# Patient Record
Sex: Female | Born: 1935 | Race: White | Hispanic: No | Marital: Married | Smoking: Never smoker
Health system: Southern US, Community
[De-identification: ages and names within clinical notes are randomized; demographics above are authoritative.]

## PROBLEM LIST (undated history)

## (undated) DIAGNOSIS — K5900 Constipation, unspecified: Secondary | ICD-10-CM

## (undated) DIAGNOSIS — N189 Chronic kidney disease, unspecified: Secondary | ICD-10-CM

## (undated) DIAGNOSIS — F329 Major depressive disorder, single episode, unspecified: Secondary | ICD-10-CM

## (undated) DIAGNOSIS — R269 Unspecified abnormalities of gait and mobility: Secondary | ICD-10-CM

## (undated) DIAGNOSIS — G20 Parkinson's disease: Secondary | ICD-10-CM

## (undated) DIAGNOSIS — S72143A Displaced intertrochanteric fracture of unspecified femur, initial encounter for closed fracture: Secondary | ICD-10-CM

## (undated) DIAGNOSIS — N390 Urinary tract infection, site not specified: Secondary | ICD-10-CM

## (undated) DIAGNOSIS — F32A Depression, unspecified: Secondary | ICD-10-CM

## (undated) DIAGNOSIS — E559 Vitamin D deficiency, unspecified: Secondary | ICD-10-CM

## (undated) DIAGNOSIS — I10 Essential (primary) hypertension: Secondary | ICD-10-CM

## (undated) DIAGNOSIS — F419 Anxiety disorder, unspecified: Secondary | ICD-10-CM

## (undated) DIAGNOSIS — I4891 Unspecified atrial fibrillation: Secondary | ICD-10-CM

## (undated) DIAGNOSIS — D649 Anemia, unspecified: Secondary | ICD-10-CM

## (undated) DIAGNOSIS — R609 Edema, unspecified: Secondary | ICD-10-CM

## (undated) DIAGNOSIS — G8929 Other chronic pain: Secondary | ICD-10-CM

## (undated) DIAGNOSIS — G4700 Insomnia, unspecified: Secondary | ICD-10-CM

## (undated) DIAGNOSIS — E876 Hypokalemia: Secondary | ICD-10-CM

## (undated) DIAGNOSIS — G20A1 Parkinson's disease without dyskinesia, without mention of fluctuations: Secondary | ICD-10-CM

## (undated) HISTORY — DX: Edema, unspecified: R60.9

## (undated) HISTORY — DX: Essential (primary) hypertension: I10

## (undated) HISTORY — DX: Hypokalemia: E87.6

## (undated) HISTORY — DX: Anxiety disorder, unspecified: F41.9

## (undated) HISTORY — PX: OTHER SURGICAL HISTORY: SHX169

## (undated) HISTORY — DX: Constipation, unspecified: K59.00

## (undated) HISTORY — DX: Other chronic pain: G89.29

## (undated) HISTORY — DX: Urinary tract infection, site not specified: N39.0

## (undated) HISTORY — DX: Major depressive disorder, single episode, unspecified: F32.9

## (undated) HISTORY — DX: Displaced intertrochanteric fracture of unspecified femur, initial encounter for closed fracture: S72.143A

## (undated) HISTORY — DX: Unspecified abnormalities of gait and mobility: R26.9

## (undated) HISTORY — DX: Unspecified atrial fibrillation: I48.91

## (undated) HISTORY — DX: Parkinson's disease without dyskinesia, without mention of fluctuations: G20.A1

## (undated) HISTORY — DX: Depression, unspecified: F32.A

## (undated) HISTORY — DX: Anemia, unspecified: D64.9

## (undated) HISTORY — DX: Parkinson's disease: G20

## (undated) HISTORY — DX: Vitamin D deficiency, unspecified: E55.9

## (undated) HISTORY — DX: Chronic kidney disease, unspecified: N18.9

## (undated) HISTORY — DX: Insomnia, unspecified: G47.00

---

## 1998-06-25 ENCOUNTER — Ambulatory Visit (HOSPITAL_COMMUNITY): Admission: RE | Admit: 1998-06-25 | Discharge: 1998-06-25 | Payer: Self-pay | Admitting: Specialist

## 1999-04-21 ENCOUNTER — Ambulatory Visit (HOSPITAL_COMMUNITY): Admission: RE | Admit: 1999-04-21 | Discharge: 1999-04-21 | Payer: Self-pay | Admitting: Family Medicine

## 1999-04-21 ENCOUNTER — Encounter: Payer: Self-pay | Admitting: Family Medicine

## 1999-05-20 ENCOUNTER — Other Ambulatory Visit: Admission: RE | Admit: 1999-05-20 | Discharge: 1999-05-20 | Payer: Self-pay | Admitting: Family Medicine

## 2000-07-06 ENCOUNTER — Other Ambulatory Visit: Admission: RE | Admit: 2000-07-06 | Discharge: 2000-07-06 | Payer: Self-pay | Admitting: Family Medicine

## 2000-08-22 ENCOUNTER — Ambulatory Visit (HOSPITAL_COMMUNITY): Admission: RE | Admit: 2000-08-22 | Discharge: 2000-08-22 | Payer: Self-pay | Admitting: Family Medicine

## 2000-08-22 ENCOUNTER — Encounter: Payer: Self-pay | Admitting: Family Medicine

## 2000-11-21 ENCOUNTER — Encounter: Admission: RE | Admit: 2000-11-21 | Discharge: 2000-11-21 | Payer: Self-pay | Admitting: Family Medicine

## 2000-11-21 ENCOUNTER — Encounter: Payer: Self-pay | Admitting: Family Medicine

## 2000-11-22 ENCOUNTER — Ambulatory Visit (HOSPITAL_COMMUNITY): Admission: RE | Admit: 2000-11-22 | Discharge: 2000-11-22 | Payer: Self-pay | Admitting: Gastroenterology

## 2002-01-18 ENCOUNTER — Other Ambulatory Visit: Admission: RE | Admit: 2002-01-18 | Discharge: 2002-01-18 | Payer: Self-pay | Admitting: Family Medicine

## 2002-11-05 ENCOUNTER — Encounter: Payer: Self-pay | Admitting: Family Medicine

## 2002-11-05 ENCOUNTER — Encounter: Admission: RE | Admit: 2002-11-05 | Discharge: 2002-11-05 | Payer: Self-pay | Admitting: Family Medicine

## 2002-11-16 ENCOUNTER — Encounter: Payer: Self-pay | Admitting: Emergency Medicine

## 2002-11-16 ENCOUNTER — Emergency Department (HOSPITAL_COMMUNITY): Admission: EM | Admit: 2002-11-16 | Discharge: 2002-11-16 | Payer: Self-pay | Admitting: Emergency Medicine

## 2002-11-19 ENCOUNTER — Emergency Department (HOSPITAL_COMMUNITY): Admission: EM | Admit: 2002-11-19 | Discharge: 2002-11-19 | Payer: Self-pay | Admitting: *Deleted

## 2003-05-01 ENCOUNTER — Other Ambulatory Visit: Admission: RE | Admit: 2003-05-01 | Discharge: 2003-05-01 | Payer: Self-pay | Admitting: Family Medicine

## 2003-07-24 ENCOUNTER — Encounter: Payer: Self-pay | Admitting: Family Medicine

## 2003-07-24 ENCOUNTER — Encounter: Admission: RE | Admit: 2003-07-24 | Discharge: 2003-07-24 | Payer: Self-pay | Admitting: Family Medicine

## 2004-03-09 ENCOUNTER — Ambulatory Visit (HOSPITAL_COMMUNITY): Admission: RE | Admit: 2004-03-09 | Discharge: 2004-03-09 | Payer: Self-pay | Admitting: Specialist

## 2004-07-24 ENCOUNTER — Ambulatory Visit (HOSPITAL_COMMUNITY): Admission: RE | Admit: 2004-07-24 | Discharge: 2004-07-24 | Payer: Self-pay | Admitting: Gastroenterology

## 2005-01-04 ENCOUNTER — Encounter: Admission: RE | Admit: 2005-01-04 | Discharge: 2005-04-04 | Payer: Self-pay | Admitting: Internal Medicine

## 2005-04-20 ENCOUNTER — Encounter: Admission: RE | Admit: 2005-04-20 | Discharge: 2005-07-19 | Payer: Self-pay | Admitting: Internal Medicine

## 2005-06-25 ENCOUNTER — Ambulatory Visit: Payer: Self-pay | Admitting: Internal Medicine

## 2005-07-27 ENCOUNTER — Ambulatory Visit: Payer: Self-pay | Admitting: Cardiovascular Disease

## 2005-08-03 ENCOUNTER — Encounter: Admission: RE | Admit: 2005-08-03 | Discharge: 2005-11-01 | Payer: Self-pay | Admitting: Internal Medicine

## 2005-08-09 ENCOUNTER — Ambulatory Visit: Payer: Self-pay

## 2005-09-30 ENCOUNTER — Ambulatory Visit: Payer: Self-pay | Admitting: Cardiovascular Disease

## 2005-11-02 ENCOUNTER — Encounter: Admission: RE | Admit: 2005-11-02 | Discharge: 2005-12-26 | Payer: Self-pay | Admitting: Internal Medicine

## 2006-01-05 ENCOUNTER — Emergency Department (HOSPITAL_COMMUNITY): Admission: EM | Admit: 2006-01-05 | Discharge: 2006-01-05 | Payer: Self-pay | Admitting: Emergency Medicine

## 2006-01-12 ENCOUNTER — Ambulatory Visit: Payer: Self-pay | Admitting: Cardiovascular Disease

## 2006-01-26 ENCOUNTER — Ambulatory Visit: Payer: Self-pay | Admitting: Cardiology

## 2006-02-09 ENCOUNTER — Ambulatory Visit: Payer: Self-pay | Admitting: Internal Medicine

## 2006-02-18 ENCOUNTER — Ambulatory Visit: Payer: Self-pay | Admitting: Psychiatry

## 2006-02-18 ENCOUNTER — Other Ambulatory Visit (HOSPITAL_COMMUNITY): Admission: RE | Admit: 2006-02-18 | Discharge: 2006-02-23 | Payer: Self-pay | Admitting: Psychiatry

## 2006-02-23 ENCOUNTER — Ambulatory Visit: Payer: Self-pay | Admitting: Internal Medicine

## 2006-03-16 ENCOUNTER — Ambulatory Visit: Payer: Self-pay | Admitting: Cardiology

## 2006-03-30 ENCOUNTER — Ambulatory Visit: Payer: Self-pay | Admitting: Cardiology

## 2006-03-30 ENCOUNTER — Ambulatory Visit: Payer: Self-pay | Admitting: Cardiovascular Disease

## 2006-04-28 ENCOUNTER — Inpatient Hospital Stay (HOSPITAL_COMMUNITY): Admission: RE | Admit: 2006-04-28 | Discharge: 2006-05-11 | Payer: Self-pay | Admitting: Psychiatry

## 2006-04-29 ENCOUNTER — Ambulatory Visit: Payer: Self-pay | Admitting: Psychiatry

## 2006-05-09 ENCOUNTER — Ambulatory Visit (HOSPITAL_COMMUNITY): Admission: RE | Admit: 2006-05-09 | Discharge: 2006-05-09 | Payer: Self-pay | Admitting: Psychiatry

## 2006-07-11 ENCOUNTER — Ambulatory Visit: Payer: Self-pay | Admitting: Internal Medicine

## 2006-08-02 ENCOUNTER — Ambulatory Visit: Payer: Self-pay | Admitting: Internal Medicine

## 2006-09-08 ENCOUNTER — Ambulatory Visit: Payer: Self-pay | Admitting: Internal Medicine

## 2006-09-13 ENCOUNTER — Ambulatory Visit: Payer: Self-pay | Admitting: Cardiology

## 2006-09-27 ENCOUNTER — Ambulatory Visit: Payer: Self-pay | Admitting: *Deleted

## 2006-10-11 ENCOUNTER — Ambulatory Visit: Payer: Self-pay | Admitting: Internal Medicine

## 2006-10-12 LAB — CONVERTED CEMR LAB
Bilirubin Urine: NEGATIVE
Epithelial cells, urine: NEGATIVE /lpf
Hemoglobin, Urine: NEGATIVE
Mucus, UA: NEGATIVE
Nitrite: POSITIVE — AB
Total Protein, Urine: NEGATIVE mg/dL
pH: 6.5 (ref 5.0–8.0)

## 2006-11-08 ENCOUNTER — Ambulatory Visit: Payer: Self-pay | Admitting: Internal Medicine

## 2007-02-06 ENCOUNTER — Ambulatory Visit: Payer: Self-pay | Admitting: Internal Medicine

## 2007-10-24 ENCOUNTER — Emergency Department (HOSPITAL_COMMUNITY): Admission: EM | Admit: 2007-10-24 | Discharge: 2007-10-24 | Payer: Self-pay | Admitting: Emergency Medicine

## 2008-12-09 ENCOUNTER — Emergency Department (HOSPITAL_COMMUNITY): Admission: EM | Admit: 2008-12-09 | Discharge: 2008-12-09 | Payer: Self-pay | Admitting: Emergency Medicine

## 2008-12-10 ENCOUNTER — Inpatient Hospital Stay: Payer: Self-pay | Admitting: Unknown Physician Specialty

## 2009-01-13 ENCOUNTER — Inpatient Hospital Stay (HOSPITAL_COMMUNITY): Admission: EM | Admit: 2009-01-13 | Discharge: 2009-01-17 | Payer: Self-pay | Admitting: Emergency Medicine

## 2009-01-13 ENCOUNTER — Ambulatory Visit: Payer: Self-pay | Admitting: Internal Medicine

## 2009-01-17 ENCOUNTER — Inpatient Hospital Stay (HOSPITAL_COMMUNITY): Admission: RE | Admit: 2009-01-17 | Discharge: 2009-01-22 | Payer: Self-pay | Admitting: Psychiatry

## 2009-01-18 ENCOUNTER — Ambulatory Visit: Payer: Self-pay | Admitting: Psychiatry

## 2010-10-24 ENCOUNTER — Inpatient Hospital Stay (HOSPITAL_COMMUNITY): Admission: EM | Admit: 2010-10-24 | Discharge: 2010-11-04 | Payer: Self-pay | Admitting: Emergency Medicine

## 2010-10-24 ENCOUNTER — Ambulatory Visit: Payer: Self-pay | Admitting: Cardiology

## 2010-10-26 ENCOUNTER — Encounter (INDEPENDENT_AMBULATORY_CARE_PROVIDER_SITE_OTHER): Payer: Self-pay | Admitting: Internal Medicine

## 2010-10-26 DIAGNOSIS — F333 Major depressive disorder, recurrent, severe with psychotic symptoms: Secondary | ICD-10-CM

## 2010-11-04 DIAGNOSIS — F331 Major depressive disorder, recurrent, moderate: Secondary | ICD-10-CM

## 2011-01-17 ENCOUNTER — Encounter: Payer: Self-pay | Admitting: Internal Medicine

## 2011-03-03 ENCOUNTER — Encounter (HOSPITAL_COMMUNITY): Payer: Self-pay

## 2011-03-03 ENCOUNTER — Emergency Department (HOSPITAL_COMMUNITY)
Admission: EM | Admit: 2011-03-03 | Discharge: 2011-03-03 | Disposition: A | Discharge status: Home / Self Care | Payer: Medicare Other | Attending: Emergency Medicine | Admitting: Emergency Medicine

## 2011-03-03 ENCOUNTER — Emergency Department (HOSPITAL_COMMUNITY): Payer: Medicare Other

## 2011-03-03 DIAGNOSIS — Y9210 Unspecified residential institution as the place of occurrence of the external cause: Secondary | ICD-10-CM | POA: Insufficient documentation

## 2011-03-03 DIAGNOSIS — IMO0002 Reserved for concepts with insufficient information to code with codable children: Secondary | ICD-10-CM | POA: Insufficient documentation

## 2011-03-03 DIAGNOSIS — W19XXXA Unspecified fall, initial encounter: Secondary | ICD-10-CM | POA: Insufficient documentation

## 2011-03-03 DIAGNOSIS — F341 Dysthymic disorder: Secondary | ICD-10-CM | POA: Insufficient documentation

## 2011-03-03 DIAGNOSIS — S0003XA Contusion of scalp, initial encounter: Secondary | ICD-10-CM | POA: Insufficient documentation

## 2011-03-03 DIAGNOSIS — N390 Urinary tract infection, site not specified: Secondary | ICD-10-CM | POA: Insufficient documentation

## 2011-03-03 LAB — URINE MICROSCOPIC-ADD ON

## 2011-03-03 LAB — POCT I-STAT, CHEM 8
BUN: 19 mg/dL (ref 6–23)
Chloride: 103 mEq/L (ref 96–112)
Glucose, Bld: 109 mg/dL — ABNORMAL HIGH (ref 70–99)
HCT: 37 % (ref 36.0–46.0)
Potassium: 3.6 mEq/L (ref 3.5–5.1)

## 2011-03-03 LAB — URINALYSIS, ROUTINE W REFLEX MICROSCOPIC
Bilirubin Urine: NEGATIVE
Glucose, UA: NEGATIVE mg/dL
Nitrite: POSITIVE — AB
Specific Gravity, Urine: 1.017 (ref 1.005–1.030)
pH: 6.5 (ref 5.0–8.0)

## 2011-03-05 LAB — URINE CULTURE

## 2011-03-09 LAB — PROTIME-INR
INR: 1.22 (ref 0.00–1.49)
INR: 1.3 (ref 0.00–1.49)
INR: 1.84 — ABNORMAL HIGH (ref 0.00–1.49)
INR: 2.51 — ABNORMAL HIGH (ref 0.00–1.49)
INR: 2.58 — ABNORMAL HIGH (ref 0.00–1.49)
INR: 2.8 — ABNORMAL HIGH (ref 0.00–1.49)
Prothrombin Time: 15.6 seconds — ABNORMAL HIGH (ref 11.6–15.2)
Prothrombin Time: 16.4 seconds — ABNORMAL HIGH (ref 11.6–15.2)
Prothrombin Time: 21.4 seconds — ABNORMAL HIGH (ref 11.6–15.2)
Prothrombin Time: 23.6 seconds — ABNORMAL HIGH (ref 11.6–15.2)
Prothrombin Time: 27.2 seconds — ABNORMAL HIGH (ref 11.6–15.2)
Prothrombin Time: 29.6 seconds — ABNORMAL HIGH (ref 11.6–15.2)

## 2011-03-09 LAB — CBC
HCT: 30.1 % — ABNORMAL LOW (ref 36.0–46.0)
HCT: 30.9 % — ABNORMAL LOW (ref 36.0–46.0)
Hemoglobin: 10.3 g/dL — ABNORMAL LOW (ref 12.0–15.0)
Hemoglobin: 11.6 g/dL — ABNORMAL LOW (ref 12.0–15.0)
Hemoglobin: 12.1 g/dL (ref 12.0–15.0)
Hemoglobin: 9.8 g/dL — ABNORMAL LOW (ref 12.0–15.0)
MCH: 31.4 pg (ref 26.0–34.0)
MCHC: 34 g/dL (ref 30.0–36.0)
MCHC: 34.3 g/dL (ref 30.0–36.0)
MCHC: 34.4 g/dL (ref 30.0–36.0)
MCHC: 34.5 g/dL (ref 30.0–36.0)
MCV: 91.7 fL (ref 78.0–100.0)
RBC: 3.29 MIL/uL — ABNORMAL LOW (ref 3.87–5.11)
RBC: 3.63 MIL/uL — ABNORMAL LOW (ref 3.87–5.11)
RBC: 3.87 MIL/uL (ref 3.87–5.11)
RDW: 13.4 % (ref 11.5–15.5)
WBC: 10 10*3/uL (ref 4.0–10.5)
WBC: 6.8 10*3/uL (ref 4.0–10.5)
WBC: 9.2 10*3/uL (ref 4.0–10.5)

## 2011-03-09 LAB — BASIC METABOLIC PANEL
BUN: 13 mg/dL (ref 6–23)
BUN: 5 mg/dL — ABNORMAL LOW (ref 6–23)
CO2: 28 mEq/L (ref 19–32)
Calcium: 8.9 mg/dL (ref 8.4–10.5)
Chloride: 107 mEq/L (ref 96–112)
Chloride: 109 mEq/L (ref 96–112)
Creatinine, Ser: 0.55 mg/dL (ref 0.4–1.2)
GFR calc Af Amer: >60 mL/min (ref >60)
GFR calc non Af Amer: >60 mL/min (ref >60)
GFR calc non Af Amer: >60 mL/min (ref >60)
Glucose, Bld: 105 mg/dL — ABNORMAL HIGH (ref 70–99)
Glucose, Bld: 118 mg/dL — ABNORMAL HIGH (ref 70–99)
Potassium: 3.4 mEq/L — ABNORMAL LOW (ref 3.5–5.1)
Potassium: 4.3 mEq/L (ref 3.5–5.1)
Sodium: 138 mEq/L (ref 135–145)

## 2011-03-10 LAB — DIFFERENTIAL
Basophils Absolute: 0 10*3/uL (ref 0.0–0.1)
Basophils Relative: 0 % (ref 0–1)
Basophils Relative: 1 % (ref 0–1)
Eosinophils Absolute: 0 10*3/uL (ref 0.0–0.7)
Eosinophils Relative: 0 % (ref 0–5)
Monocytes Absolute: 0.6 10*3/uL (ref 0.1–1.0)
Monocytes Absolute: 0.9 10*3/uL (ref 0.1–1.0)
Monocytes Relative: 9 % (ref 3–12)
Neutro Abs: 4.5 10*3/uL (ref 1.7–7.7)

## 2011-03-10 LAB — CBC
HCT: 38.2 % (ref 36.0–46.0)
HCT: 40.7 % (ref 36.0–46.0)
Hemoglobin: 12.2 g/dL (ref 12.0–15.0)
MCH: 31.8 pg (ref 26.0–34.0)
MCHC: 33.8 g/dL (ref 30.0–36.0)
MCHC: 34.1 g/dL (ref 30.0–36.0)
MCV: 92.2 fL (ref 78.0–100.0)
MCV: 93.7 fL (ref 78.0–100.0)
Platelets: 233 10*3/uL (ref 150–400)
Platelets: 247 10*3/uL (ref 150–400)
RBC: 3.9 MIL/uL (ref 3.87–5.11)
RBC: 4.15 MIL/uL (ref 3.87–5.11)
RDW: 13.2 % (ref 11.5–15.5)
RDW: 13.3 % (ref 11.5–15.5)
RDW: 13.3 % (ref 11.5–15.5)
WBC: 7.1 10*3/uL (ref 4.0–10.5)
WBC: 9.8 10*3/uL (ref 4.0–10.5)

## 2011-03-10 LAB — BASIC METABOLIC PANEL
BUN: 10 mg/dL (ref 6–23)
BUN: 10 mg/dL (ref 6–23)
BUN: 9 mg/dL (ref 6–23)
CO2: 24 mEq/L (ref 19–32)
CO2: 25 mEq/L (ref 19–32)
Chloride: 103 mEq/L (ref 96–112)
Chloride: 106 mEq/L (ref 96–112)
Creatinine, Ser: 0.61 mg/dL (ref 0.4–1.2)
Creatinine, Ser: 0.67 mg/dL (ref 0.4–1.2)
GFR calc non Af Amer: >60 mL/min (ref >60)
Potassium: 4.6 mEq/L (ref 3.5–5.1)

## 2011-03-10 LAB — RAPID URINE DRUG SCREEN, HOSP PERFORMED
Opiates: NOT DETECTED
Tetrahydrocannabinol: NOT DETECTED

## 2011-03-10 LAB — URINE MICROSCOPIC-ADD ON

## 2011-03-10 LAB — PROTIME-INR
INR: 1.34 (ref 0.00–1.49)
INR: 1.44 (ref 0.00–1.49)
Prothrombin Time: 12.6 seconds (ref 11.6–15.2)
Prothrombin Time: 16.8 seconds — ABNORMAL HIGH (ref 11.6–15.2)
Prothrombin Time: 17.7 seconds — ABNORMAL HIGH (ref 11.6–15.2)

## 2011-03-10 LAB — APTT: aPTT: 31 seconds (ref 24–37)

## 2011-03-10 LAB — TSH: TSH: 1.708 u[IU]/mL (ref 0.350–4.500)

## 2011-03-10 LAB — CK TOTAL AND CKMB (NOT AT ARMC)
CK, MB: 4.7 ng/mL — ABNORMAL HIGH (ref 0.3–4.0)
Total CK: 285 U/L — ABNORMAL HIGH (ref 7–177)

## 2011-03-10 LAB — COMPREHENSIVE METABOLIC PANEL
AST: 16 U/L (ref 0–37)
Albumin: 3.6 g/dL (ref 3.5–5.2)
Alkaline Phosphatase: 80 U/L (ref 39–117)
BUN: 15 mg/dL (ref 6–23)
GFR calc Af Amer: >60 mL/min (ref >60)
Potassium: 3.3 mEq/L — ABNORMAL LOW (ref 3.5–5.1)
Sodium: 142 mEq/L (ref 135–145)
Total Protein: 6.7 g/dL (ref 6.0–8.3)

## 2011-03-10 LAB — URINE CULTURE

## 2011-03-10 LAB — ETHANOL: Alcohol, Ethyl (B): <5 mg/dL (ref 0–10)

## 2011-03-10 LAB — URINALYSIS, ROUTINE W REFLEX MICROSCOPIC
Glucose, UA: NEGATIVE mg/dL
Nitrite: POSITIVE — AB
Specific Gravity, Urine: 1.019 (ref 1.005–1.030)
pH: 7.5 (ref 5.0–8.0)

## 2011-04-12 LAB — PROTIME-INR
INR: 1.8 — ABNORMAL HIGH (ref 0.00–1.49)
INR: 2.5 — ABNORMAL HIGH (ref 0.00–1.49)
INR: 1.4 (ref 0.00–1.49)
INR: 1.8 — ABNORMAL HIGH (ref 0.00–1.49)
INR: 2.5 — ABNORMAL HIGH (ref 0.00–1.49)
INR: 8.4 (ref 0.00–1.49)
Prothrombin Time: 19.3 seconds — ABNORMAL HIGH (ref 11.6–15.2)
Prothrombin Time: 18.2 seconds — ABNORMAL HIGH (ref 11.6–15.2)
Prothrombin Time: 21.5 seconds — ABNORMAL HIGH (ref 11.6–15.2)
Prothrombin Time: 28.6 seconds — ABNORMAL HIGH (ref 11.6–15.2)
Prothrombin Time: 78.7 seconds — ABNORMAL HIGH (ref 11.6–15.2)

## 2011-04-12 LAB — CBC
HCT: 41.7 % (ref 36.0–46.0)
Hemoglobin: 13.9 g/dL (ref 12.0–15.0)
Platelets: 264 10*3/uL (ref 150–400)
RBC: 4.4 MIL/uL (ref 3.87–5.11)
RDW: 13.6 % (ref 11.5–15.5)
WBC: 10.3 10*3/uL (ref 4.0–10.5)

## 2011-04-12 LAB — APTT: aPTT: 48 seconds — ABNORMAL HIGH (ref 24–37)

## 2011-04-12 LAB — DIFFERENTIAL
Basophils Absolute: 0 10*3/uL (ref 0.0–0.1)
Basophils Relative: 0 % (ref 0–1)
Eosinophils Absolute: 0 10*3/uL (ref 0.0–0.7)
Neutro Abs: 8.9 10*3/uL — ABNORMAL HIGH (ref 1.7–7.7)
Neutrophils Relative %: 86 % — ABNORMAL HIGH (ref 43–77)

## 2011-04-12 LAB — BASIC METABOLIC PANEL
Chloride: 112 mEq/L (ref 96–112)
GFR calc non Af Amer: >60 mL/min (ref >60)
GFR calc non Af Amer: >60 mL/min (ref >60)
Glucose, Bld: 112 mg/dL — ABNORMAL HIGH (ref 70–99)
Glucose, Bld: 77 mg/dL (ref 70–99)
Potassium: 4.1 mEq/L (ref 3.5–5.1)
Potassium: 4.4 mEq/L (ref 3.5–5.1)
Sodium: 138 mEq/L (ref 135–145)
Sodium: 140 mEq/L (ref 135–145)

## 2011-04-12 LAB — CK
Total CK: 303 U/L — ABNORMAL HIGH (ref 7–177)
Total CK: 93 U/L (ref 7–177)
Total CK: 989 U/L — ABNORMAL HIGH (ref 7–177)

## 2011-04-12 LAB — URINALYSIS, ROUTINE W REFLEX MICROSCOPIC
Bilirubin Urine: NEGATIVE
Glucose, UA: NEGATIVE mg/dL
Glucose, UA: NEGATIVE mg/dL
Ketones, ur: >80 mg/dL — AB
Ketones, ur: NEGATIVE mg/dL
Leukocytes, UA: NEGATIVE
Protein, ur: 30 mg/dL — AB
Protein, ur: NEGATIVE mg/dL
Urobilinogen, UA: 0.2 mg/dL (ref 0.0–1.0)

## 2011-04-12 LAB — RAPID URINE DRUG SCREEN, HOSP PERFORMED
Amphetamines: NOT DETECTED
Benzodiazepines: POSITIVE — AB
Cocaine: NOT DETECTED

## 2011-04-12 LAB — ETHANOL: Alcohol, Ethyl (B): <5 mg/dL (ref 0–10)

## 2011-05-06 ENCOUNTER — Other Ambulatory Visit: Payer: Self-pay | Admitting: Family Medicine

## 2011-05-06 DIAGNOSIS — M25571 Pain in right ankle and joints of right foot: Secondary | ICD-10-CM

## 2011-05-07 ENCOUNTER — Other Ambulatory Visit: Payer: Medicare Other

## 2011-05-11 NOTE — H&P (Signed)
NAME:  [name] [name], [name] [name]                  ACCOUNT NO.:  [id number]   MEDICAL RECORD NO.:  [id number]          PATIENT TYPE:  IPS   LOCATION:  0300                          FACILITY:  [facility]   PHYSICIAN:  Anselm Jungling, MD  DATE OF BIRTH:  05-14-1936   DATE OF ADMISSION:  01/17/2009  DATE OF DISCHARGE:                       PSYCHIATRIC ADMISSION ASSESSMENT   This is a voluntary admission to the services of Dr. Geralyn Flash.   This is a 75 year old married female.  She has been married to this  husband for 34 years.  Today, she can tell me the reason she is here is  because I took a bunch of pills.  Apparently, she was suspicious that  her husband was having an affair.  There has been much marital arguing  about this.  The husband acknowledges that he has been cussing at her;  however, they both deny physical abuse.   While driving the patient to the hospital after she overdosed, she tried  to jump out of the car.  She had displayed a 6-week progressive  depressed mood, decreased energy, poor concentration, anhedonia, and  suicidal thoughts.   She has an extensive psychiatric history.  She claims her first bout of  depression was in 1975.  She has had numerous psychiatric admissions to  [facility], to [facility], to [place].  She has had ECT in the past.  She stated  that the last time they did ECT was in 2008; however, it did not work.  Last year when admitted to [facility], they suggested ECT, but she denied  it.   She states that although she has sisters who are diagnosed as bipolar,  she herself is not bipolar.   SOCIAL HISTORY:  She is divorced once.  She has 2 children from that  marriage.  She graduated high school.  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   FAMILY HISTORY:  Patient is 1 of 5 siblings, 4 of whom have required  psychiatric hospitalization.   ALCOHOL/DRUG HISTORY:  She denies.   PRIMARY CARE [place]:  Dr. Artist Pais and  Dr. Carney Corners.  She had her first visit  with Dr. Betti Cruz on December 30, 2008.  One of her sisters goes to Dr.  Betti Cruz.   MEDICAL PROBLEMS:  A fib.   MEDICATIONS:  At the time she was discharged from inpatient care over to  [place], she was discharged on:  1. Atenolol 25 mg p.o. b.i.d.  2. Remeron 30 mg nightly.  3. Effexor XR 75 mg p.o. daily.  4. Multivitamin.  5. Diltiazem CD 180 mg p.o. daily.  6. Ativan 1 mg p.o. q.4h. p.r.n. acute agitation.  7. Acetaminophen 650 mg p.o. q.6h. p.r.n.  8. Lunesta 2 mg nightly for sleep.   DRUG ALLERGIES:  PENICILLIN.   POSITIVE PHYSICAL FINDINGS:  She is thin.  She reports having a 50-pound  weight loss over the last 2 years.  The rest of her physical exam is  well documented and on the chart.  VITAL SIGNS ON ADMISSION:  She  is 60-1/2 inches tall.  She weighs 103.  Temperature 98.  Blood pressure 123/75.  Pulse 86.  Respirations 16.   She is status post a tubal ligation, glaucoma, history for A fib.  She  reports having had elevated blood sugar while on Seroquel, which  resolved once off.  She also currently has bilateral hand tremor, which  is a new onset and probably is familial.   MENTAL STATUS EXAM:  She is alert and oriented.  She is appropriately  groomed, dressed, and nourished.  She has very good eye contact.  Her  speech is not pressured.  Her mood is appropriate to the situation.  She  reports more severe depression than her affect reflects  Judgment and  insight are good.  Concentration and memory are intact.  Intelligence is  at least average.  She denies being actively suicidal or homicidal at  this time.  She denies auditory or visual hallucinations.   DIAGNOSES:   AXIS I:  1. Mood disorder, not otherwise specified.  2. Depressed.  3. Major depressive disorder, recurrent, severe.   AXIS II:  None.   AXIS III:  Long history for atrial fibrillation and glaucoma.   AXIS IV:  1. Marital.  2. General medical issues.   AXIS V:   20.   PLAN:  Admit for further safety and stabilization.  Pharmacy will follow  her Coumadin for her A fib.  We are going to stop the Effexor and start  Prozac.  We can begin a dose with that today, 20 mg p.o. q.a.m. and will  continue her other meds.   ESTIMATED LENGTH OF STAY:  3-5 days.     Mickie Leonarda Salon, P.A.-C.      Anselm Jungling, MD  Electronically Signed   MD/MEDQ  D:  01/18/2009  T:  01/18/2009  Job:  [id number]

## 2011-05-11 NOTE — Discharge Summary (Signed)
NAME:  [facility], Reise                  ACCOUNT NO.:  [id number]   MEDICAL RECORD NO.:  [id number]          PATIENT TYPE:  IPS   LOCATION:  0300                          FACILITY:  [facility]   PHYSICIAN:  Jasmine Pang, M.D. DATE OF BIRTH:  10/15/1936   DATE OF ADMISSION:  01/17/2009  DATE OF DISCHARGE:  01/22/2009                               DISCHARGE SUMMARY   IDENTIFICATION:  This is a 75 year old married white female who was  admitted on a voluntary basis on January 17, 2009.   HISTORY OF PRESENT ILLNESS:  The patient has been married to her husband  for 34 years.  She states she is here because I took a bunch of pills.  Apparently, she was suspicious that her husband was having an affair.  There was marital arguing about this.  Husband acknowledges that he has  been cussing at her.  However, they both deny physical abuse.  While  driving the patient to the hospital, she overdosed and tried to jump out  of the car.  She has displayed a 6-week progressive depressed mood,  decreased energy, poor concentration, anhedonia, and suicidal thoughts.  She has an extensive psychiatric history.  She claims her first bout of  depression was in 1975.  She has had numerous psychiatric admissions to  [facility] [place].  She has had ECT in the past.  She stated the  last time they did ECT was in 2008; however, it did not work.  Last year  when she admitted to [facility], they suggested ECT, but she declined it.  She states that although she has sisters, who are diagnosed as bipolar,  she herself with not bipolar.  She had her first visit with Dr. Betti Cruz on  December 30, 2008.  One of her sisters goes to Dr. Betti Cruz and that is how  she got referred to him.   PAST PSYCHIATRIC HISTORY:  See above.   FAMILY HISTORY:  The patient's one of 5 siblings, 4 of them have  required psychiatric hospitalizations.   ALCOHOL AND DRUG HISTORY:  The patient denies.   MEDICAL PROBLEMS:  Atrial fibrillation and  glaucoma.   MEDICATIONS:  1. Atenolol 25 mg p.o. b.i.d.  2. Remeron 30 mg nightly.  3. Effexor XR 75 mg daily.  4. Multivitamin daily.  5. Diltiazem CD 180 mg daily.  6. Ativan 1 mg p.o. q.4 h. p.r.n. agitation.  7. Acetaminophen 650 mg p.o. q.6 h. p.r.n. pain.  8. Lunesta 2 mg nightly for sleep.   DRUG ALLERGIES:  PENICILLIN.   PHYSICAL FINDINGS:  The patient's physical exam was done in the ED prior  to transfer here.  She reports having a 50-pound weight loss over the  past 2 years.  There were no other notable medical or physical problems.  She is status post tubal ligation, glaucoma, and has a history for AFib.  She reports having elevated blood sugar while on Seroquel, which  resolved.  She also currently has bilateral hand tremor, which is a new  onset and probably is from milieu.   HOSPITAL COURSE:  Upon admission, the patient was started on Coumadin 5  mg p.o. daily with the pharmacy to monitor Coumadin dosing.  She was  also started on atenolol 25 mg p.o. b.i.d., Remeron 30 mg p.o. q.h.s.,  Effexor XR 75 mg p.o. q. day, multivitamin p.o. q. day, diltiazem CD 118  mg daily, Ativan 1 mg p.o. q.6 hours p.r.n. agitation.  She was also  placed on one-to-one due to her unsteadiness and concern that she was a  fall risk.  On January 18, 2009, the Effexor was stopped.  She was  started on Prozac 20 mg daily since she states this has helped her in  the past.  She was also placed on Lunesta 2 mg p.o. q.h.s. and she  states Alfonso Patten has helped her in the past.  In individual sessions with  me, the patient was reserved, but cooperative.  She states she had been  in the [facility].  Dr. Okey Dupre had consulted on her and felt  she needed to transfer here.  She was admitted to [facility] because of  an overdose of pills (Pristiq, Remeron, and atenolol).  She stated she  did not want to live.  She is suspicious that her husband is having an  affair.  There has been marital  arguing about this.  The patient has had  psychomotor retardation.  Speech was soft and slow.  She was disheveled.  Mood was depressed, anxious.  Affect consistent with mood.  Anxiety  level moderate.  There was no evidence of psychosis or thought disorder.  As hospitalization progressed, she continued to be depressed and  anxious.  She did, however, participate in unit therapeutic groups and  activities.  Sleep was poor.  She was very frustrated about this.  She  continued to be on one-to-one for being a fall risk.  Lunesta was  increased to 3 mg p.o. q.h.s. due to her difficulty sleeping.  On  January 20, 2009, she continued to be depressed and anxious, but there  was no suicidal ideation.  Her husband had been visiting.  She states  this has gone well, but he does not understand depression.  She is  worried about getting worse if she goes home.  On January 21, 2009, the  patient was less depressed, less anxious.  She was talking about the  possibility of going home soon.  She still said she feels hopeless,  however.  She was having difficulty sleeping again and Lunesta was  increased to 6 mg p.o. q.h.s.  One-to-one was continued due to fall  risk.  There was a family session with the patient and her husband.  She  reported that she had been depressed over a number of years, but thinks  it gotten worse, significantly worse over the past few months because  she suspected her husband was having an affair.  She reported that he  hired a woman to help with household chores, cooking, cleaning, Architectural technologist. and  this woman moved in and took over my house and my husband.  The  patient reports that husband bought the woman a car and was spending  inappropriate amounts of time with her.  Husband denied that anything  happened and tried to redirect the discussion back to the patient's  depression.  His believe is that she does not want to get better.  Both  were unable to see each others perspective and  wanted to place the blame  on one another.  They  both said they wanted to improve things, but need  or were willing to entertain discussion of anything that might work  (individual marital counseling support groups etc).  Counselor  encouraged husband to consider how the marital problems are contributing  to the depression and vice versa.  Counselor also encouraged the patient  to take advantage of the help that is being offered.  On January 22, 2009, the patient's mental status had improved.  She was less depressed,  less anxious.  Affect was consistent with mood.  There was no suicidal  ideation or homicidal ideation.  No thoughts of self-injurious behavior.  No auditory or visual hallucinations.  No paranoia or delusions.  Thoughts were logical and goal-directed.  Thought content no predominant  theme.  Cognitive was grossly intact.  Insight fair.  Judgment good.  Impulse control good.  The patient wanted to go home today and it was  felt she was safe for discharge.  Her husband agreed and came to pick  her up.   DISCHARGE DIAGNOSES:  Axis I:  Major depressive disorder, recurrent  severe without psychosis.  Axis II:  None.  Axis III:  History of atrial fibrillation and glaucoma.  Axis IV:  Severe (marital, general medical issues, burden of psychiatric  disorder).  Axis V:  Global assessment of functioning was 50 upon discharge.  GAF  was 20 upon admission.  GAF highest past year was 60 to 65.   DISCHARGE PLANS:  There was no specific dietary restrictions.  The  patient's activity level was limited by her unsteady gait.   POSTHOSPITAL CARE PLANS:  The patient will go to [facility] to see Saul Fordyce on February 1st at 1:40 p.m.  She will also  get a counselor at this appointment.   DISCHARGE MEDICATIONS:  1. Tenormin 25 mg one twice a day.  2. Remeron 30 mg at bedtime.  3. Prozac 20 mg daily.  4. Multivitamin daily.  5. Cardizem 180 mg daily.  6.  Coumadin 5 mg 1-1/2 pill on Thursday, January 23, 2009, then had PT      and INR checked on Friday for further dosing of Coumadin.  7. Restoril 15 mg p.o. q.h.s. 1 to 2 tablets as needed.      Jasmine Pang, M.D.  Electronically Signed     BHS/MEDQ  D:  01/22/2009  T:  01/22/2009  Job:  [id number]

## 2011-05-11 NOTE — Discharge Summary (Signed)
NAME:  [name] [name]                  ACCOUNT NO.:  [id number]   MEDICAL RECORD NO.:  [id number]          PATIENT TYPE:  INP   LOCATION:  1510                         FACILITY:  [facility]   PHYSICIAN:  Rosalyn Gess. Norins, MD  DATE OF BIRTH:  02/24/1936   DATE OF ADMISSION:  01/13/2009  DATE OF DISCHARGE:                               DISCHARGE SUMMARY   ADMITTING DIAGNOSES:  1. Suicidal ideation and attempt.  2. Dehydration.  3. Rhabdomyolysis.  4. Atrial fibrillation with rapid ventricular response.   DISCHARGE DIAGNOSES:  1. Mood disorder with depressive affect, recurrent and severe.  Ready      for inpatient care per recommendations of consulting psychiatrist.  2. Atrial fibrillation with rapid ventricular response, stable on      calcium-channel blocker.  3. Rhabdomyolysis, resolved.  4. Coagulopathy, resolved.   HISTORY OF PRESENT ILLNESS:  The patient is a 75 year old Caucasian  female with a known history of psychiatric disorder.  She was brought to  the emergency department for inpatient treatment after she made an  attempt to jump out of a moving vehicle today.  She also take an  overdose of her medications including Pristiq, Remeron and atenolol.  She was found at admission in the emergency department to have a very  rapid heart rate.  Also, she appeared to be dehydrated.  She was  subsequently admitted for care.   PAST MEDICAL HISTORY:  Per the admission note by Dr. Yetta [name].   FAMILY HISTORY/SOCIAL HISTORY:  Likewise well documented in the  admission note.   MEDICAL HISTORY:  Is significant for:  1. Glaucoma.  2. Tonsillectomy.  3. Tubal ligation.  4. Atrial fibrillation.   The patient's social history is significant for having been married and  divorced.  She has 2 biologic children.  She has been having marital  stress.   HOSPITAL COURSE:  1. Psychiatric.  The patient was put on suicide watch.  She was seen      in consultation by Dr. Antonietta Breach.  Please see  his dictated      consult note.  His recommendations were inpatient psychiatric      treatment when medically stable.  The patient during her hospital      course has been relatively calm and doing well with the medications      that she is currently taking.  2. Atrial fibrillation with rapid ventricular response.  The patient      was initially on telemetry.  She had a very rapid heart rate in the      120 to 130 range.  She was started on diltiazem 60 mg q.8 h. and on      this regimen maintained a stable heart rate with atrial      fibrillation.  Would at this time continue this medication in long-      acting form of diltiazem CD 180 mg once daily.  3. Rhabdomyolysis.  The patient had initial CK of 989 with no MB      fraction.  The patient was hydrated.  CKs were tracked,  and they      returned to normal with final CK being 93 with this problem being      resolved.  4. Coagulopathy.  The patient's INR dramatically increased after      hospital admission, reaching a maximum of 9.1.  It was thought this      was due to overdose of Pristiq which has a known interaction with      Coumadin.  Pharmacy helped manage the patient.  Coumadin was held,      so was Lovenox.  She was given 5 mg of vitamin K on the day prior      to discharge.  Final discharge INR was 1.8.  The patient will be      resumed on Coumadin therapy.   With the patient's medical problems being stable, she is now ready for  transfer to inpatient psychiatric care.   DISCHARGE EXAMINATION:  Temperature 97.8, blood pressure 118/67, heart  rate 78, respirations 18.  GENERAL APPEARANCE:  This is an woman sitting on the side of the bed  with a calm affect in no acute distress.  HEENT EXAM:  The patient has a small abrasion of the left forehead.  Otherwise unremarkable.  NECK:  Was supple.  CHEST:  No CVA tenderness.  LUNGS:  Clear with no rales, wheezes or rhonchi.  CARDIOVASCULAR:  Patient had a quiet precordium.  She  had an irregular  irregular heart rate that was rate controlled.  No significant murmur  noted.  ABDOMEN:  Was soft.  EXTREMITIES:  Without deformity.  NEUROLOGIC EXAM:  The patient is awake, alert.  She has a depressed  affect.  Cranial nerves II-XII grossly intact.  No further testing was  performed.   FINAL DATA:  The patient had CT of the brain at time of admission which  showed no significant or acute findings.   FINAL LABORATORY:  INR on the day of discharge was 1.8.  Urinalysis from  January 21 was clear.  Creatinine kinase total from January 21 was 93  and normal.  Basic metabolic panel from January 20 with a glucose of  112, creatinine of 0.49.  Electrolytes were normal.   DISCHARGE MEDICATIONS:  1. Will restart Coumadin 5 mg daily.  The patient will need to have a      follow-up pro time on Monday 25th or Tuesday the 26th.  2. Atenolol 25 mg b.i.d.  3. Remeron 30 mg every night.  4. Effexor XR 75 mg daily.  5. Multivitamin daily.  6. Diltiazem CD 180 mg daily.  7. Ativan 1 mg p.o. or IM q.4 h. p.r.n. acute agitation.  8. Acetaminophen 650 mg q.6 h. p.r.n.  9. Lunesta 2 mg every night for sleep.   The patient's condition at time of discharge dictation is medically  stable and ready for transfer to inpatient psychiatric care.      Rosalyn Gess Norins, MD  Electronically Signed     MEN/MEDQ  D:  01/17/2009  T:  01/17/2009  Job:  [id number]   cc:   Antonietta Breach, M.D.
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
## 2011-05-14 NOTE — Assessment & Plan Note (Signed)
[facility]                             PRIMARY CARE OFFICE NOTE   NAME:[name] [name]                         MRN:          [id number]  DATE:07/11/2006                            DOB:          1936-07-01    The patient is a 75 year old female, a new patient, presents to establish  primary.  She comes with her husband.  He is complaining of her not eating  for three days.  When asked herself, she said that she is scared of what is  going to happen to her.  She is seeing things on TV, hearing voices.  Complains of being depressed and shaking.   PAST MEDICAL HISTORY:  1.  Atrial fibrillation.  2.  Depressive disorder, status post hospitalization in January for suicidal      attempt (drank Clorox).  In May was hospitalized at the [facility] again.  She was seeing Dr. Donell Beers.   CURRENT MEDICATIONS:  Atenolol only.  She stopped Zoloft, Zyprexa,  Wellbutrin, and Coumadin.   FAMILY HISTORY:  A sister with bipolar disorder.   SOCIAL HISTORY:  She is married.  Does not smoke or drink.   REVIEW OF SYSTEMS:  No chest pain or shortness of breath.  Weight loss.  No  appetite.  Anxiety and depression.  Hallucinations as above.  Very anxious.  Unable to sleep.  The rest is negative.   PHYSICAL EXAMINATION:  VITAL SIGNS:  Blood pressure 120/74, pulse 84,  temperature 96.6, weight 107 pounds (was 160 last year).  GENERAL:  She is in mild emotional distress, anxious and tearful.  Has  tremor in the upper extremities.  HEENT:  Moist mucosa.  NECK:  Supple.  LUNGS:  Clear.  No wheezes.  HEART:  S1 and S2.  No gallop.  Irregular rhythm.  ABDOMEN:  Soft, nontender.  No organomegaly, no masses.  EXTREMITIES:  Lower extremities without edema.  NEUROLOGIC:  She is alert, cooperative and seems to be oriented.   ASSESSMENT/PLAN:  1.  Atrial fibrillation.  She has been off Coumadin.  In this particular      situation, I think we need to stay off of it  until her psychological      stage improves.  She is asked to take aspirin 81 mg a day if stomach      tolerates it.  2.  Tremor.  Likely multifactorial.  We will use benzodiazepines.  3.  Psychotic depression/anxiety/delusions.  She is having visual and      auditory hallucinations.  She did have suicidal attempt in the past.      For now will just treat her severe anxiety with Xanax XR 1 mg daily #30      with one refill.  I will see her back in one to two weeks to reassess      therapy.  She was asked to start eating again.  4.  Failure to thrive.  Check lab work.   PLAN:  She was asked to follow up with Dr. Donell Beers.  However, she is  not  willing at this point.                                   Sonda Primes, MD   AP/MedQ  DD:  07/14/2006  DT:  07/15/2006  Job #:  [id number]

## 2011-05-14 NOTE — Op Note (Signed)
[facility]  Patient:    [name] [name], [facility]                          MRN: [id number] Proc. Date: 11/22/00 Attending:  Everardo All. Madilyn Fireman, M.D. CC:         Vale Haven. Andrey Campanile, M.D.   Operative Report  PROCEDURE:  Colonoscopy.  GASTROENTEROLOGIST:  Everardo All. Madilyn Fireman, M.D.  INDICATIONS FOR PROCEDURE:  Recent onset of bloody diarrhea which is resolved. The patient also has a family history of colon cancer in a first degree relative.  DESCRIPTION OF PROCEDURE:   The patient was placed in the left lateral decubitus position and placed on the pulse monitor with continuous low-flow oxygen delivered by nasal cannula.  She was sedated with 100 mg IV Demerol and 10 mg IV Versed.  The Olympus video colonoscope was inserted into the rectum and advanced to the cecum, confirmed by transillumination of McBurneys point and visualization of the ileocecal valve and appendiceal orifice.  The prep was excellent.  The cecum, ascending, transverse, descending, and sigmoid colon all appeared normal with no masses, polyps, diverticula, or other mucosal abnormalities.  The rectum likewise appeared normal. On retroflexed view, the anus revealed only small internal hemorrhoids but no stigma of hemorrhage.  The colonoscope was then withdrawn, and the patient returned to the recovery room in stable condition.  She tolerated the procedure well, and there were no immediate complications.  IMPRESSIONS:  Internal hemorrhoids, otherwise normal colonoscopy.  PLAN:  Expectant management alone.  Repeat colonoscopy in five years based on her family history. DD:  11/22/00 TD:  11/22/00 Job: [id number]

## 2011-05-14 NOTE — Op Note (Signed)
NAME:  [name] [name], [name] [name]                            ACCOUNT NO.:  [id number]   MEDICAL RECORD NO.:  [id number]                   PATIENT TYPE:  AMB   LOCATION:  ENDO                                 FACILITY:  [facility]   PHYSICIAN:  John C. Madilyn Fireman, M.D.                 DATE OF BIRTH:  [birth year]   DATE OF PROCEDURE:  07/24/2004  DATE OF DISCHARGE:                                 OPERATIVE REPORT   PROCEDURE:  Colonoscopy.   INDICATIONS FOR PROCEDURE:  A family history of colon cancer in a first-  degree relative as well as recent abdominal cramps with some urgency and  incontinence.   PROCEDURE:  The patient was placed in the left lateral decubitus position  and placed on the pulse monitor with continuous low flow oxygen delivered by  nasal cannula.  She was sedated with 100 mcg IV fentanyl and 10 mg IV  Versed.  The Olympus video colonoscope is inserted into the rectum and  advanced to the cecum.  Confirmed by transillumination at McBurney'[name] point  and visualization of the ileocecal valve and appendiceal orifice.  Prep is  excellent.  The cecum, ascending, transverse, descending, and sigmoid colon  all appeared normal with no masses, polyps, diverticula, or other mucosal  abnormalities.  The rectum likewise appeared normal, and retroflexed view of  the anus revealed no obvious internal hemorrhoids.  The scope was then  withdrawn, and the patient returned to the recovery room in stable  condition.  She tolerated the procedure well, and there were no immediate  complications.   IMPRESSION:  Normal colonoscopy.   PLAN:  We will try her on a course of hyoscyamine and follow up in the  office in a few weeks.                                               John C. Madilyn Fireman, M.D.    JCH/MEDQ  D:  07/24/2004  T:  07/24/2004  Job:  [id number]   cc:   Aida Puffer  [address]  [place]  [place] [address]  Fax: [phone]

## 2011-05-14 NOTE — Discharge Summary (Signed)
NAME:  [facility], [place]                  ACCOUNT NO.:  [id number]   MEDICAL RECORD NO.:  [id number]          PATIENT TYPE:  IPS   LOCATION:  0303                          FACILITY:  [facility]   PHYSICIAN:  Geoffery Lyons, M.D.      DATE OF BIRTH:  [birth year]   DATE OF ADMISSION:  04/28/2006  DATE OF DISCHARGE:  05/11/2006                                 DISCHARGE SUMMARY   CHIEF COMPLAINT AND PRESENT ILLNESS:  This was the first admission to [facility] for this 75 year old white female, married, voluntarily  admitted.  Presented as a walk-in, brought by the husband.  Has been tearful  and anxious.  Has been staying in bed for two weeks, getting up to eat  occasionally.  Worse in the past month.  Husband thought that it was the  medications.  Not taking her Ativan regularly.  Some neurovegetative.  Not  herself, not able to function.  Tried to electrocute herself with a curling  iron, drank bleach one week ago to poison herself.   PAST PSYCHIATRIC HISTORY:  Dr. Donell Beers.  First time at [facility].  Long history of multiple depressive episodes.  Prior suicide attempts.  Last  attempt four years ago.  Previous treatment with ECT and multiple  medications.   ALCOHOL/DRUG HISTORY:  Denies active use of any substances.   MEDICAL HISTORY:  Benign central tremor.   MEDICATIONS:  Zoloft 200 mg for four weeks, Ativan 1 mg three times a day  (not taking regularly), Cosopt to the left eye, Lumigan to the left eye,  Warfarin 5 mg, Ambien CR 12.5 mg at night, atenolol 50 mg per day, Lipitor  10 mg per day, Ativan (as already stated) 1 mg three times a day as needed,  but hardly taking.   PHYSICAL EXAMINATION:  Performed and failed to show any acute findings.   LABORATORY DATA:  Not available in the chart.   MENTAL STATUS EXAM:  Alert, cooperative female.  Obvious tremor of the head  at rest.  Mood anxious.  Affect anxious, tearful.  Speech tremulous.  Thought processes positive for  suicidal ideation.  Worrying, looking for  ways to kill self as she feels miserable.  Claims difficulty with recent and  remote memories.  Cannot concentrate.  Somatically focused.  Hopeless,  helpless.   ADMISSION DIAGNOSES:  AXIS I:  Major depression, recurrent.  Rule out  psychotic features.  AXIS II:  No diagnosis.  AXIS III:  Glaucoma, atrial fibrillation, essential tremor.  AXIS IV:  Moderate.  AXIS V:  GAF upon admission 30; highest GAF in the last year 65.   HOSPITAL COURSE:  She was admitted.  She was started in individual and group  psychotherapy.  She endorsed increased signs and symptoms of depression.  She and her husband are taking care of the 29-year-old grandson who she  claims had alcohol fetal syndrome.  Very upset, start increasingly being  very depressed, very upset.  Has been on Pamelor for 6-7 months.  Did not do  well.  Asked to be placed on  Zoloft as a friend of hers did well on it.  Zoloft up to 200 mg, saying that it is not doing anything for her.  Endorsed  that she had had a nervous breakdown, waning a lot of reassurance.  No  sleep, anxiety, overwhelmed, anxious, agitated.  Has used Prozac, Effexor,  Cymbalta and she had ECT in 2001.  We used Zyprexa Zydis to help with the  anxiety and the sleep.  She continued to endorse she was not feeling any  better.  At times evidencing a little brighter affect but tearful, is not as  tearful, was able to contain herself better.  Having a hard time with sleep,  very fragile, very little coping skills.  Continues to say that she  experienced a nervous breakdown.  Hopeless, helpless.  Continued to endorse  depression.  We worked with the Zyprexa.  We started decreasing the Zoloft  and started her on Wellbutrin as she was complaining of no energy, no  motivation and difficulty with concentration.  Continued to endorse that she  was feeling down, had to be reassured and encouraged and asked to give the  medication a try.   She was wanting to be out of the hospital by mother's day  but she was still endorsing the depression.  By May 11th, she was still  feeling depressed, dealing with the depression but sleep improved, appetite  was better.  We went ahead and increased the Wellbutrin to 300 mg per day.  For the next couple of days, continued to evidence some labile affect, sense  of hopelessness, worried that she might have done some damage when she took  the bleach.  Had to be reassured that all her laboratory workups were, did a  CT scan that was negative.  On May 16th, she wanted to be discharged.  She  denied any active suicidal or homicidal ideation and objectively she was  better, able to smile more.  She was sleeping.  She was eating.  She was  more hopeful, so we went ahead and discharged to outpatient follow-up.   DISCHARGE DIAGNOSES:  AXIS I:  Major depression, recurrent, rule out  psychotic features.  AXIS II:  No diagnosis.  AXIS III:  Glaucoma, essential tremor.  AXIS IV:  Moderate.  AXIS V:  GAF upon discharge 50.   DISCHARGE MEDICATIONS:  1.  Atenolol 50 mg per day.  2.  Lipitor 10 mg per day.  3.  Zoloft 50 mg per day.  4.  Zyprexa 10 mg at night.  5.  Ativan 1 mg at night and 1 mg twice a day as needed.  6.  Zyprexa 2.5 mg twice a day.  7.  Coumadin 5 mg per day.  8.  Wellbutrin 100 mg SR twice a day with plan to increase to 150 mg twice a      day.  9.  Ambien 12.5 mg at bedtime for sleep.   FOLLOWUP:  Dr. Milagros Evener on June 16, 2006.      Geoffery Lyons, M.D.  Electronically Signed     IL/MEDQ  D:  05/27/2006  T:  05/28/2006  Job:  [id number]

## 2011-06-15 ENCOUNTER — Ambulatory Visit
Admission: RE | Admit: 2011-06-15 | Discharge: 2011-06-15 | Disposition: A | Discharge status: Home / Self Care | Payer: Medicare Other | Source: Ambulatory Visit | Attending: Family Medicine | Admitting: Family Medicine

## 2011-06-15 DIAGNOSIS — M25571 Pain in right ankle and joints of right foot: Secondary | ICD-10-CM

## 2011-08-11 ENCOUNTER — Other Ambulatory Visit: Payer: Self-pay | Admitting: Neurological Surgery

## 2011-08-11 ENCOUNTER — Other Ambulatory Visit: Payer: Self-pay | Admitting: Neurology

## 2011-08-11 DIAGNOSIS — R269 Unspecified abnormalities of gait and mobility: Secondary | ICD-10-CM

## 2011-08-11 DIAGNOSIS — G20 Parkinson's disease: Secondary | ICD-10-CM

## 2011-08-11 DIAGNOSIS — F329 Major depressive disorder, single episode, unspecified: Secondary | ICD-10-CM

## 2011-08-17 ENCOUNTER — Ambulatory Visit
Admission: RE | Admit: 2011-08-17 | Discharge: 2011-08-17 | Disposition: A | Discharge status: Home / Self Care | Payer: Medicare Other | Source: Ambulatory Visit | Attending: Neurology | Admitting: Neurology

## 2011-08-17 DIAGNOSIS — F329 Major depressive disorder, single episode, unspecified: Secondary | ICD-10-CM

## 2011-08-17 DIAGNOSIS — R269 Unspecified abnormalities of gait and mobility: Secondary | ICD-10-CM

## 2011-08-17 DIAGNOSIS — G20 Parkinson's disease: Secondary | ICD-10-CM

## 2011-10-01 LAB — CBC
HCT: 44.1 % (ref 36.0–46.0)
Hemoglobin: 14.8 g/dL (ref 12.0–15.0)
MCV: 93 fL (ref 78.0–100.0)
Platelets: 285 10*3/uL (ref 150–400)
RBC: 4.74 MIL/uL (ref 3.87–5.11)
WBC: 5.4 10*3/uL (ref 4.0–10.5)

## 2011-10-01 LAB — URINALYSIS, ROUTINE W REFLEX MICROSCOPIC
Glucose, UA: NEGATIVE mg/dL
Ketones, ur: 15 mg/dL — AB
pH: 6 (ref 5.0–8.0)

## 2011-10-01 LAB — DIFFERENTIAL
Eosinophils Absolute: 0 10*3/uL (ref 0.0–0.7)
Eosinophils Relative: 1 % (ref 0–5)
Lymphs Abs: 1.6 10*3/uL (ref 0.7–4.0)
Monocytes Absolute: 0.4 10*3/uL (ref 0.1–1.0)
Monocytes Relative: 7 % (ref 3–12)

## 2011-10-01 LAB — URINE MICROSCOPIC-ADD ON

## 2011-10-01 LAB — RAPID URINE DRUG SCREEN, HOSP PERFORMED
Cocaine: NOT DETECTED
Opiates: NOT DETECTED

## 2011-10-01 LAB — URINE CULTURE

## 2011-10-01 LAB — BASIC METABOLIC PANEL
BUN: 14 mg/dL (ref 6–23)
Chloride: 99 mEq/L (ref 96–112)
Potassium: 3.2 mEq/L — ABNORMAL LOW (ref 3.5–5.1)
Sodium: 136 mEq/L (ref 135–145)

## 2011-10-01 LAB — ETHANOL: Alcohol, Ethyl (B): <5 mg/dL (ref 0–10)

## 2011-10-06 LAB — BASIC METABOLIC PANEL
CO2: 28
Calcium: 10.1
GFR calc Af Amer: >60
GFR calc non Af Amer: >60
Glucose, Bld: 109 — ABNORMAL HIGH
Potassium: 3.8
Sodium: 141

## 2011-10-06 LAB — RAPID URINE DRUG SCREEN, HOSP PERFORMED
Amphetamines: NOT DETECTED
Barbiturates: NOT DETECTED
Benzodiazepines: NOT DETECTED
Cocaine: NOT DETECTED
Opiates: NOT DETECTED
Tetrahydrocannabinol: NOT DETECTED

## 2011-10-06 LAB — URINALYSIS, ROUTINE W REFLEX MICROSCOPIC
Bilirubin Urine: NEGATIVE
Hgb urine dipstick: NEGATIVE
Ketones, ur: 15 — AB
Nitrite: NEGATIVE
Urobilinogen, UA: 0.2

## 2011-10-06 LAB — DIFFERENTIAL
Basophils Absolute: 0
Eosinophils Relative: 0
Lymphocytes Relative: 22
Monocytes Absolute: 0.6
Monocytes Relative: 12 — ABNORMAL HIGH
Neutro Abs: 3.2

## 2011-10-06 LAB — CBC
HCT: 42.3
Hemoglobin: 14.4
MCHC: 34.2
RBC: 4.52
RDW: 13.4

## 2011-10-06 LAB — PROTIME-INR
INR: 0.9
Prothrombin Time: 12.7

## 2013-04-17 ENCOUNTER — Encounter: Payer: Self-pay | Admitting: Adult Health

## 2013-04-17 ENCOUNTER — Non-Acute Institutional Stay (SKILLED_NURSING_FACILITY): Payer: Medicare Other | Admitting: Adult Health

## 2013-04-17 DIAGNOSIS — R634 Abnormal weight loss: Secondary | ICD-10-CM | POA: Insufficient documentation

## 2013-04-17 DIAGNOSIS — G20 Parkinson's disease: Secondary | ICD-10-CM

## 2013-04-17 DIAGNOSIS — E559 Vitamin D deficiency, unspecified: Secondary | ICD-10-CM | POA: Insufficient documentation

## 2013-04-17 DIAGNOSIS — R609 Edema, unspecified: Secondary | ICD-10-CM | POA: Insufficient documentation

## 2013-04-17 DIAGNOSIS — K5900 Constipation, unspecified: Secondary | ICD-10-CM

## 2013-04-17 DIAGNOSIS — G250 Essential tremor: Secondary | ICD-10-CM | POA: Insufficient documentation

## 2013-04-17 DIAGNOSIS — G252 Other specified forms of tremor: Secondary | ICD-10-CM

## 2013-04-17 DIAGNOSIS — I10 Essential (primary) hypertension: Secondary | ICD-10-CM | POA: Insufficient documentation

## 2013-04-17 DIAGNOSIS — I4891 Unspecified atrial fibrillation: Secondary | ICD-10-CM | POA: Insufficient documentation

## 2013-04-17 DIAGNOSIS — F329 Major depressive disorder, single episode, unspecified: Secondary | ICD-10-CM

## 2013-06-13 ENCOUNTER — Non-Acute Institutional Stay (SKILLED_NURSING_FACILITY): Payer: Medicare Other | Admitting: Internal Medicine

## 2013-06-13 DIAGNOSIS — G20A1 Parkinson's disease without dyskinesia, without mention of fluctuations: Secondary | ICD-10-CM

## 2013-06-13 DIAGNOSIS — E559 Vitamin D deficiency, unspecified: Secondary | ICD-10-CM

## 2013-06-13 DIAGNOSIS — I10 Essential (primary) hypertension: Secondary | ICD-10-CM

## 2013-06-13 DIAGNOSIS — I4891 Unspecified atrial fibrillation: Secondary | ICD-10-CM

## 2013-06-13 DIAGNOSIS — K5900 Constipation, unspecified: Secondary | ICD-10-CM

## 2013-06-13 DIAGNOSIS — R609 Edema, unspecified: Secondary | ICD-10-CM

## 2013-06-13 DIAGNOSIS — F329 Major depressive disorder, single episode, unspecified: Secondary | ICD-10-CM

## 2013-06-13 DIAGNOSIS — R634 Abnormal weight loss: Secondary | ICD-10-CM

## 2013-06-13 DIAGNOSIS — G20 Parkinson's disease: Secondary | ICD-10-CM

## 2013-06-13 NOTE — Progress Notes (Signed)
Patient ID: [name] [name], female   DOB: May 27, 1936, 77 y.o.   MRN: [id number] Location:  [facility] Provider:  Gwenith Spitz. Renato Gails, D.O., C.M.D.  Code Status:  Full code per Reita Cliche  Chief Complaint: medical mgt of chronic diseases  HPI:  77 yo female with h/o Parkinson's disease, dementia, afib, constipation, major depression and vitamin D deficiency was seen for mgt of her chronic illnesses.  Review of Systems:  Review of Systems  Constitutional: Positive for malaise/fatigue. Negative for weight loss.  HENT: Negative for congestion.   Eyes: Negative for blurred vision.  Respiratory: Negative for cough and shortness of breath.   Cardiovascular: Positive for leg swelling. Negative for chest pain and palpitations.  Gastrointestinal: Negative for heartburn, constipation, blood in stool and melena.  Genitourinary: Negative for dysuria.  Musculoskeletal: Positive for myalgias. Negative for falls.  Skin: Negative for rash.  Neurological: Positive for tremors and weakness.  Endo/Heme/Allergies: Bruises/bleeds easily.  Psychiatric/Behavioral: Positive for memory loss.     Medications: Patient's Medications  New Prescriptions   No medications on file  Previous Medications   ANTISEPTIC ORAL RINSE (BIOTENE) LIQD    15 mLs by Mouth Rinse route 2 (two) times daily.   ASPIRIN 81 MG TABLET    Take 81 mg by mouth daily.   BUSPIRONE (BUSPAR) 15 MG TABLET    Take 15 mg by mouth 2 (two) times daily.   CARBIDOPA-LEVODOPA (SINEMET IR) 25-100 MG PER TABLET    Take 1 tablet by mouth 4 (four) times daily.   CHOLECALCIFEROL (VITAMIN D) 1000 UNITS TABLET    Take 1,000 Units by mouth daily.   DILTIAZEM (CARDIZEM) 60 MG TABLET    Take 60 mg by mouth daily.   DOCUSATE SODIUM (COLACE) 100 MG CAPSULE    Take 100 mg by mouth 2 (two) times daily.   FLUOXETINE (PROZAC) 20 MG CAPSULE    Take 60 mg by mouth daily.   FUROSEMIDE (LASIX) 20 MG TABLET    Take 20 mg by mouth daily.   LAMOTRIGINE  (LAMICTAL) 150 MG TABLET    Take 150 mg by mouth daily.   METOPROLOL TARTRATE (LOPRESSOR) 25 MG TABLET    Take 12.5 mg by mouth daily.   MULTIPLE VITAMIN (MULTIVITAMIN) TABLET    Take 1 tablet by mouth daily.   OXYCODONE (OXY-IR) 5 MG CAPSULE    Take 5 mg by mouth every 4 (four) hours as needed.   PRIMIDONE (MYSOLINE) 50 MG TABLET    Take 25 mg by mouth at bedtime.   QUETIAPINE (SEROQUEL) 25 MG TABLET    Take 25 mg by mouth at bedtime.   SENNOSIDES-DOCUSATE SODIUM (SENOKOT-S) 8.6-50 MG TABLET    Take 2 tablets by mouth daily.  Modified Medications   No medications on file  Discontinued Medications   MEGESTROL (MEGACE) 400 MG/10ML SUSPENSION    Take 400 mg by mouth daily.    Physical Exam: There were no vitals filed for this visit. Physical Exam  Constitutional: She appears well-developed and well-nourished. No distress.  HENT:  Head: Normocephalic and atraumatic.  Cardiovascular: Normal rate, regular rhythm, normal heart sounds and intact distal pulses.   Pulmonary/Chest: Effort normal and breath sounds normal. No respiratory distress.  Abdominal: Soft. Bowel sounds are normal. She exhibits no distension. There is no tenderness.  Musculoskeletal: She exhibits edema.  Neurological: She is alert.  Confused;  cogwheeling rigidity  Skin: Skin is warm and dry.   Labs reviewed: 04/18/13:  hba1c 5.5, avg glucose  111  Significant Diagnostic Results:  05/28/13:  CXR:  Mildly enlarged heart with mild central congestion, no evidence of infiltrate or chf.  Assessment/Plan 1. Unspecified vitamin D deficiency -cont vitamin D supplement  2. Major depression -continue current therapy  3. Parkinson disease -complains that this is poorly controlled, will plan to adjust medications and if not improving, sent her back to neurology   4. Essential hypertension, benign -bp at goal with currently therapy  5. A-fib -rate controlled with current meds  6. Unspecified constipation -moving bowels  per staff, cont stool softeners and miralax if no bm with those  7. Edema -likely worsened by megace which doesn't really help with weight gain anyway so d/c megace  8. Loss of weight -D/c megace due to weight gain (likely fat and fluid, not muscle) since 10/13--weight 133 lbs now, also on nutritional supplements  Family/ staff Communication: discussed care with nursing staff, weights with dietitian  Goals of care: full code per Bobby  Labs/tests ordered:  Cbc, cmp, vitamin D, hba1c due to medications (lamictal, seroquel)

## 2013-06-19 ENCOUNTER — Other Ambulatory Visit (HOSPITAL_COMMUNITY): Payer: Self-pay | Admitting: Internal Medicine

## 2013-06-19 DIAGNOSIS — R1310 Dysphagia, unspecified: Secondary | ICD-10-CM

## 2013-06-28 ENCOUNTER — Other Ambulatory Visit (HOSPITAL_COMMUNITY): Payer: Medicare Other

## 2013-06-28 ENCOUNTER — Ambulatory Visit (HOSPITAL_COMMUNITY): Payer: Medicare Other

## 2013-06-28 ENCOUNTER — Ambulatory Visit (HOSPITAL_COMMUNITY): Admission: RE | Admit: 2013-06-28 | Payer: Medicare Other | Source: Ambulatory Visit

## 2013-07-02 ENCOUNTER — Ambulatory Visit (HOSPITAL_COMMUNITY)
Admission: RE | Admit: 2013-07-02 | Discharge: 2013-07-02 | Disposition: A | Discharge status: Home / Self Care | Payer: Medicare Other | Source: Ambulatory Visit | Attending: Internal Medicine | Admitting: Internal Medicine

## 2013-07-02 DIAGNOSIS — R1310 Dysphagia, unspecified: Secondary | ICD-10-CM

## 2013-07-02 DIAGNOSIS — I4891 Unspecified atrial fibrillation: Secondary | ICD-10-CM | POA: Insufficient documentation

## 2013-07-02 DIAGNOSIS — G20A1 Parkinson's disease without dyskinesia, without mention of fluctuations: Secondary | ICD-10-CM | POA: Insufficient documentation

## 2013-07-02 DIAGNOSIS — I129 Hypertensive chronic kidney disease with stage 1 through stage 4 chronic kidney disease, or unspecified chronic kidney disease: Secondary | ICD-10-CM | POA: Insufficient documentation

## 2013-07-02 DIAGNOSIS — R1312 Dysphagia, oropharyngeal phase: Secondary | ICD-10-CM | POA: Insufficient documentation

## 2013-07-02 DIAGNOSIS — N189 Chronic kidney disease, unspecified: Secondary | ICD-10-CM | POA: Insufficient documentation

## 2013-07-02 DIAGNOSIS — G20 Parkinson's disease: Secondary | ICD-10-CM | POA: Insufficient documentation

## 2013-07-02 NOTE — Procedures (Signed)
Objective Swallowing Evaluation: Modified Barium Swallowing Study  Patient Details  Name: [name] [name] MRN: [id number] Date of Birth: 1936/05/23  Today's Date: 07/02/2013 Time: 1301-1330 SLP Time Calculation (min): 29 min  Past Medical History:  Past Medical History  Diagnosis Date  . Vitamin D deficiency   . Edema   . Anemia   . Hypokalemia   . Depression   . Anxiety   . Parkinson disease   . Chronic pain   . Hypertension   . A-fib   . Constipation   . Chronic kidney disease   . UTI (lower urinary tract infection)   . Insomnia   . Intertrochanteric fracture    Past Surgical History:  Past Surgical History  Procedure Laterality Date  . Right hip surgery due to fracture     HPI:  77 yo female resident of SNF referred for MBS due to pt frequent choking on foods in the last two weeks raising concern for possible aspiration. Pt also admits to occasional sensation of stasis in chest - although she denies this occuring every meal.  Pt is wheelchair bound but is able to feed herself but he is messy due to her tremor.  PMH + for neuromuscular disease requiring pt to take Sinemet, cognitive deficits, influenza, major depressive disorder, anxiety, afib, chronic pain, insomnia, UTI, traumatic fx.  Pt denies any other medical/physical changes in the last few weeks.  Weight loss nor pulmonary infections noted.       Assessment / Plan / Recommendation Clinical Impression  Dysphagia Diagnosis: Mild oral phase dysphagia;Mild pharyngeal phase dysphagia;Suspected primary esophageal dysphagia Clinical impression: Pt presents with mild oropharyngeal and suspected primary esophageal deficits.  No aspiration or laryngeal penetration noted during tested.  Delayed oral transiting with lingual rocking present due to motor disorder.  Mild amount of pharygneal residuals observed which clear with liquid and dry swallows.    Please note pt did not cough during MBS therefore did not demonstrate symptoms she  is presenting during meals, therefore can not definitively rule out occasional aspiration from stasis.   Recommend continue skilled SLP for oral bolus control and strengthening.  Pt may benefit from dedicated esophageal work-up as suspect her primary symptoms are consistent with esophageal deficits. Pt was educated during testing to findings, recommendations and precautions.     Treatment Recommendation    Continued SLP at SNF   Diet Recommendation Regular;Thin liquid (soft meats)   Liquid Administration via: Straw;Cup Medication Administration: Whole meds with puree (follow with water) Supervision: Full supervision/cueing for compensatory strategies Compensations: Slow rate;Small sips/bites;Follow solids with liquid Postural Changes and/or Swallow Maneuvers: Seated upright 90 degrees;Upright 30-60 min after meal    Other  Recommendations Oral Care Recommendations: Oral care QID   Follow Up Recommendations  Skilled Nursing facility           SLP Swallow Goals  n/a defer to primary SLP from SNF   General Date of Onset: 07/02/13 HPI: 77 yo female resident of SNF referred for MBS due to pt frequent choking on foods in the last two weeks raising concern for possible aspiration. Pt also admits to occasional sensation of stasis in chest - although she denies this occuring every meal.  Pt is wheelchair bound but is able to feed herself but he is messy due to her tremor.  PMH + for neuromuscular disease requiring pt to take Sinemet, cognitive deficits, influenza, major depressive disorder, anxiety, afib, chronic pain, insomnia, UTI, traumatic fx.  Pt denies any other medical/physical  changes in the last few weeks.  Weight loss nor pulmonary infections noted.   Type of Study: Modified Barium Swallowing Study Reason for Referral: Objectively evaluate swallowing function Diet Prior to this Study: Regular;Thin liquids Temperature Spikes Noted: Yes Respiratory Status: Room air Behavior/Cognition:  Alert;Cooperative;Pleasant mood Oral Cavity - Dentition: Adequate natural dentition Oral Motor / Sensory Function:  (lingual deviation right upon protrusion, generally weak) Self-Feeding Abilities: Able to feed self;Needs set up Baseline Vocal Quality: Clear Volitional Cough: Strong Volitional Swallow: Able to elicit Anatomy: Within functional limits    Reason for Referral Objectively evaluate swallowing function   Oral Phase Oral Preparation/Oral Phase Oral Phase: Impaired Oral - Nectar Oral - Nectar Cup: Weak lingual manipulation;Delayed oral transit;Lingual pumping Oral - Nectar Straw: Weak lingual manipulation;Delayed oral transit;Lingual pumping Oral - Thin Oral - Thin Cup: Weak lingual manipulation;Delayed oral transit;Lingual pumping Oral - Thin Straw: Weak lingual manipulation;Delayed oral transit;Lingual pumping Oral - Solids Oral - Puree: Weak lingual manipulation;Delayed oral transit Oral - Regular: Weak lingual manipulation;Delayed oral transit Oral - Pill: Weak lingual manipulation;Delayed oral transit   Pharyngeal Phase Pharyngeal Phase Pharyngeal Phase: Impaired Pharyngeal - Nectar Pharyngeal - Nectar Straw: Pharyngeal residue - valleculae Pharyngeal - Thin Pharyngeal - Thin Cup: Pharyngeal residue - valleculae Pharyngeal - Thin Straw: Pharyngeal residue - valleculae Pharyngeal - Solids Pharyngeal - Puree: Pharyngeal residue - valleculae Pharyngeal - Regular: Pharyngeal residue - valleculae Pharyngeal - Pill: Pharyngeal residue - valleculae Pharyngeal Phase - Comment Pharyngeal Comment: mild amount of vallecular stasis noted, most especially with solids/pudding - further swallows aided clearance  Cervical Esophageal Phase    GO    Cervical Esophageal Phase Cervical Esophageal Phase: Impaired Cervical Esophageal Phase - Nectar Nectar Cup: Within functional limits Nectar Straw: Within functional limits Cervical Esophageal Phase - Thin Thin Cup: Within  functional limits Thin Straw: Within functional limits Cervical Esophageal Phase - Solids Puree: Within functional limits Regular: Within functional limits Pill: Within functional limits Cervical Esophageal Phase - Comment Cervical Esophageal Comment: Pt appears with stasis at proximal esophagus without sensation, thin water consumption (several sips) appeared to aid clearance with minimal amount of backflow, ? primary esophageal deficits- Pt did NOT sense stasis    Functional Assessment Tool Used: mbs, clinical judgement Functional Limitations: Swallowing Swallow Current Status (E4540): At least 1 percent but less than 20 percent impaired, limited or restricted Swallow Goal Status [phone]): At least 1 percent but less than 20 percent impaired, limited or restricted Swallow Discharge Status [phone]): At least 1 percent but less than 20 percent impaired, limited or restricted   Donavan Burnet, MS [facility] SLP [phone]

## 2013-08-28 ENCOUNTER — Other Ambulatory Visit: Payer: Self-pay | Admitting: *Deleted

## 2013-08-28 MED ORDER — OXYCODONE HCL 5 MG PO CAPS: 5.0000 mg | ORAL_CAPSULE | ORAL | Status: DC | PRN

## 2013-09-03 ENCOUNTER — Other Ambulatory Visit: Payer: Self-pay | Admitting: *Deleted

## 2013-09-03 MED ORDER — OXYCODONE HCL 5 MG PO CAPS: 5.0000 mg | ORAL_CAPSULE | ORAL | Status: DC | PRN

## 2013-09-12 ENCOUNTER — Non-Acute Institutional Stay (SKILLED_NURSING_FACILITY): Payer: Medicare Other | Admitting: Internal Medicine

## 2013-09-12 DIAGNOSIS — G20A1 Parkinson's disease without dyskinesia, without mention of fluctuations: Secondary | ICD-10-CM

## 2013-09-12 DIAGNOSIS — I10 Essential (primary) hypertension: Secondary | ICD-10-CM

## 2013-09-12 DIAGNOSIS — G20 Parkinson's disease: Secondary | ICD-10-CM

## 2013-09-12 DIAGNOSIS — F329 Major depressive disorder, single episode, unspecified: Secondary | ICD-10-CM

## 2013-09-12 DIAGNOSIS — R609 Edema, unspecified: Secondary | ICD-10-CM

## 2013-09-12 DIAGNOSIS — E559 Vitamin D deficiency, unspecified: Secondary | ICD-10-CM

## 2013-09-12 DIAGNOSIS — I4891 Unspecified atrial fibrillation: Secondary | ICD-10-CM

## 2013-09-12 NOTE — Progress Notes (Signed)
Patient ID: [name] [name], female   DOB: 1936/07/15, 77 y.o.   MRN: [id number] Location:  Location:  [facility] Provider:  Gwenith Spitz. Renato Gails, D.O., C.M.D.  Code Status:  Full code   Chief Complaint  Patient presents with  . Medical Managment of Chronic Issues    medical mgt chronic illnesses    HPI:  77 yo white female with h/o Parkinson's disease, depression, vitamin D deficiency, chronic constipation seen for medical mgt of her chronic diseases.  She is currently on several meds for her tremors without improvement and she is frustrated by this.  I advised she should go back to [facility] to see the movement disorders specialist.  Likely can stop primidone b/c I doubt this is helping with Parkinson's disease.    Review of Systems:  Review of Systems  Constitutional: Positive for malaise/fatigue. Negative for fever and chills.  Respiratory: Negative for cough and shortness of breath.   Cardiovascular: Negative for chest pain.  Gastrointestinal: Negative for abdominal pain and constipation.  Genitourinary: Negative for dysuria.  Skin: Negative for rash.  Neurological: Positive for tremors.       Weakness of her legs, cannot walk, they are rotated inward  Psychiatric/Behavioral: Positive for depression.    Medications: Patient's Medications  New Prescriptions   No medications on file  Previous Medications   ANTISEPTIC ORAL RINSE (BIOTENE) LIQD    15 mLs by Mouth Rinse route 2 (two) times daily.   ASPIRIN 81 MG TABLET    Take 81 mg by mouth daily.   BUSPIRONE (BUSPAR) 15 MG TABLET    Take 15 mg by mouth 2 (two) times daily.   CARBIDOPA-LEVODOPA (SINEMET IR) 25-100 MG PER TABLET    Take 1 tablet by mouth 4 (four) times daily.   CHOLECALCIFEROL (VITAMIN D) 1000 UNITS TABLET    Take 1,000 Units by mouth daily.   DILTIAZEM (CARDIZEM) 60 MG TABLET    Take 60 mg by mouth daily.   DOCUSATE SODIUM (COLACE) 100 MG CAPSULE    Take 100 mg by mouth 2 (two) times daily.   FLUOXETINE (PROZAC) 20 MG CAPSULE    Take 60 mg by mouth daily.   FUROSEMIDE (LASIX) 20 MG TABLET    Take 20 mg by mouth daily.   LAMOTRIGINE (LAMICTAL) 150 MG TABLET    Take 150 mg by mouth daily.   METOPROLOL TARTRATE (LOPRESSOR) 25 MG TABLET    Take 12.5 mg by mouth daily.   MULTIPLE VITAMIN (MULTIVITAMIN) TABLET    Take 1 tablet by mouth daily.   OXYCODONE (OXY-IR) 5 MG CAPSULE    Take 1 capsule (5 mg total) by mouth every 4 (four) hours as needed.   PRIMIDONE (MYSOLINE) 50 MG TABLET    Take 25 mg by mouth at bedtime.   ROPINIROLE (REQUIP) 5 MG TABLET    Take 5 mg by mouth at bedtime.   SENNOSIDES-DOCUSATE SODIUM (SENOKOT-S) 8.6-50 MG TABLET    Take 2 tablets by mouth daily.  Modified Medications   No medications on file  Discontinued Medications   QUETIAPINE (SEROQUEL) 25 MG TABLET    Take 25 mg by mouth at bedtime.    Physical Exam: There were no vitals filed for this visit. Physical Exam  Constitutional:  Obese white female  HENT:  Head: Normocephalic and atraumatic.  Cardiovascular: Normal rate, regular rhythm, normal heart sounds and intact distal pulses.   Pulmonary/Chest: Effort normal and breath sounds normal. No respiratory distress.  Abdominal: Soft.  Bowel sounds are normal. She exhibits no distension. There is no tenderness.  Musculoskeletal: She exhibits no edema and no tenderness.  Severe resting tremors of the upper extremities bilaterally, weakness of bilateral LEs  Neurological: She is alert.  Skin: Skin is warm and dry. There is pallor.  Has seborrheic dermatitis on her face  Psychiatric:  Flat affect   Assessment/Plan 1. Parkinson disease -tremors are not adequately controlled at this point -I will send her back to [facility] for further eval and tx 2. Essential hypertension, benign -bp at goal with current therapy  3. A-fib -rate controlled with current therapy, on baby asa  4. Edema -mild, some tenderness of lower legs on exam, no changes  needed  5. Major depression -being followed by nceps, is on lamictal  6. Unspecified vitamin D deficiency -f/u levels  Family/ staff Communication: discussed with nursing staff Goals of care:  Full code  Labs/tests ordered:  Vit D level, cbc, return to [facility]

## 2013-09-16 ENCOUNTER — Encounter: Payer: Self-pay | Admitting: Internal Medicine

## 2013-10-30 NOTE — Progress Notes (Signed)
Patient ID: [name] [name], female   DOB: 09-Jun-1936, 77 y.o.   MRN: [id number]  [facility]  Allergies  Allergen Reactions  . Penicillins      Chief Complaint  Patient presents with  . Medical Managment of Chronic Issues    HPI:  No problem-specific assessment & plan notes found for this encounter.   Past Medical History  Diagnosis Date  . Vitamin D deficiency   . Edema   . Anemia   . Hypokalemia   . Depression   . Anxiety   . Parkinson disease   . Chronic pain   . Hypertension   . A-fib   . Constipation   . Chronic kidney disease   . UTI (lower urinary tract infection)   . Insomnia   . Intertrochanteric fracture     Past Surgical History  Procedure Laterality Date  . Right hip surgery due to fracture      VITAL SIGNS BP 118/67  Pulse 68  Ht 5\' 2"  (1.575 m)  Wt 126 lb (57.153 kg)  BMI 23.04 kg/m2   Medications: Patient's Medications   New Prescriptions     No medications on file   Previous Medications     ANTISEPTIC ORAL RINSE (BIOTENE) LIQD     15 mLs by Mouth Rinse route 2 (two) times daily.     ASPIRIN 81 MG TABLET     Take 81 mg by mouth daily.     BUSPIRONE (BUSPAR) 15 MG TABLET     Take 15 mg by mouth 2 (two) times daily.     CARBIDOPA-LEVODOPA (SINEMET IR) 25-100 MG PER TABLET     Take 1 tablet by mouth 4 (four) times daily.     CHOLECALCIFEROL (VITAMIN D) 1000 UNITS TABLET     Take 1,000 Units by mouth daily.     DILTIAZEM (CARDIZEM) 60 MG TABLET     Take 60 mg by mouth daily.     DOCUSATE SODIUM (COLACE) 100 MG CAPSULE     Take 100 mg by mouth 2 (two) times daily.     FLUOXETINE (PROZAC) 20 MG CAPSULE     Take 60 mg by mouth daily.     FUROSEMIDE (LASIX) 20 MG TABLET     Take 20 mg by mouth daily.     LAMOTRIGINE (LAMICTAL) 150 MG TABLET     Take 150 mg by mouth daily.     METOPROLOL TARTRATE (LOPRESSOR) 25 MG TABLET     Take 12.5 mg by mouth daily.     MULTIPLE VITAMIN (MULTIVITAMIN) TABLET     Take 1 tablet by mouth daily.     OXYCODONE  (OXY-IR) 5 MG CAPSULE     Take 5 mg by mouth every 4 (four) hours as needed.     PRIMIDONE (MYSOLINE) 50 MG TABLET     Take 25 mg by mouth at bedtime.     QUETIAPINE (SEROQUEL) 25 MG TABLET     Take 25 mg by mouth at bedtime.     SENNOSIDES-DOCUSATE SODIUM (SENOKOT-S) 8.6-50 MG TABLET     Take 2 tablets by mouth daily.   Modified Medications     No medications on file   Discontinued Medications      SIGNIFICANT DIAGNOSTIC EXAMS  LABS REVIEWED:   12-26-12: wbc 7.1; hgb 13.8; hct 39.2; mcv 91.0; plt 301; glucose 106; bun 13; creat 0.57; k+3.9; na++136  01-10-13: vit d 36    Review of Systems  Constitutional: Negative for malaise/fatigue.  Respiratory:  Negative for cough and shortness of breath.   Cardiovascular: Negative for chest pain.  Gastrointestinal: Negative for heartburn and abdominal pain.  Musculoskeletal: Negative for myalgias.  Skin: Negative.   Neurological: Negative for headaches.  Psychiatric/Behavioral: Negative for depression. The patient does not have insomnia.     Physical Exam  Constitutional: No distress.  thin  Neck: Neck supple. No JVD present.  Cardiovascular: Normal rate, regular rhythm and intact distal pulses.   Respiratory: Effort normal and breath sounds normal. No respiratory distress. She has no wheezes.  GI: Soft. Bowel sounds are normal. She exhibits no distension. There is no tenderness.  Musculoskeletal: Normal range of motion. She exhibits no edema.  Neurological: She is alert.  Skin: Skin is warm and dry.      ASSESSMENT/ PLAN:  1. Hypertension: stable will continue asa 81 mg daily; cardizem 60 mg daily; lopressor 12.5 mg daily;   2. afib heart rate steady; will continue asa 81 mg daily; cardizem 60 mg daily for rate control  3. Edema; will continue lasix 20 mg daily   4. Tremor: will continue primidone 25 mg nightly is without change  5. Parkinson disease; without change in status; will continue sinemet 25/100 mg 4 times  daily  6. Major depression; is emotionally stable will continue prozac 60 mg daily; lamictal 150 mg daily to help stabilize mood buspar 15 mg twice daily for anxiety ; and seroquel 25 mg nightly   7. Constipation: will continue colace twice daily and senna s 2 tabs daily   Will check hgb a1c next draw

## 2013-11-28 ENCOUNTER — Non-Acute Institutional Stay (SKILLED_NURSING_FACILITY): Payer: Medicare Other | Admitting: Internal Medicine

## 2013-11-28 DIAGNOSIS — I4891 Unspecified atrial fibrillation: Secondary | ICD-10-CM

## 2013-11-28 DIAGNOSIS — G20 Parkinson's disease: Secondary | ICD-10-CM

## 2013-11-28 DIAGNOSIS — E559 Vitamin D deficiency, unspecified: Secondary | ICD-10-CM

## 2013-11-28 DIAGNOSIS — I10 Essential (primary) hypertension: Secondary | ICD-10-CM

## 2013-11-28 DIAGNOSIS — K5900 Constipation, unspecified: Secondary | ICD-10-CM

## 2013-11-28 DIAGNOSIS — G20A1 Parkinson's disease without dyskinesia, without mention of fluctuations: Secondary | ICD-10-CM

## 2013-11-28 DIAGNOSIS — F329 Major depressive disorder, single episode, unspecified: Secondary | ICD-10-CM

## 2013-11-28 NOTE — Progress Notes (Signed)
Patient ID: [name] [name], female   DOB: Nov 16, 1936, 77 y.o.   MRN: [id number]   Location:  [facility] Provider:  Gwenith Spitz. Renato Gails, D.O., C.M.D.  Code Status:  Full code   Chief Complaint  Patient presents with  . Medical Managment of Chronic Issues    HPI:  77 yo white female with h/o Parkinson's disease, depression, vitamin D deficiency, chronic constipation seen for medical mgt of her chronic diseases.     Review of Systems:  Review of Systems  Constitutional: Positive for malaise/fatigue. Negative for fever and chills.  Respiratory: Negative for cough and shortness of breath.   Cardiovascular: Negative for chest pain.  Gastrointestinal: Negative for abdominal pain and constipation.  Genitourinary: Negative for dysuria.  Skin: Negative for rash.  Neurological: Positive for tremors.       Weakness of her legs, cannot walk, they are rotated inward  Psychiatric/Behavioral: Positive for depression.    Medications: Patient's Medications  New Prescriptions   No medications on file  Previous Medications   ANTISEPTIC ORAL RINSE (BIOTENE) LIQD    15 mLs by Mouth Rinse route 2 (two) times daily.   ASPIRIN 81 MG TABLET    Take 81 mg by mouth daily.   BUSPIRONE (BUSPAR) 15 MG TABLET    Take 15 mg by mouth 2 (two) times daily.   CARBIDOPA-LEVODOPA (SINEMET IR) 25-100 MG PER TABLET    Take 1 tablet by mouth 4 (four) times daily.   CHOLECALCIFEROL (VITAMIN D) 1000 UNITS TABLET    Take 1,000 Units by mouth daily.   DILTIAZEM (CARDIZEM) 60 MG TABLET    Take 60 mg by mouth daily.   DOCUSATE SODIUM (COLACE) 100 MG CAPSULE    Take 100 mg by mouth 2 (two) times daily.   FLUOXETINE (PROZAC) 20 MG CAPSULE    Take 60 mg by mouth daily.   FUROSEMIDE (LASIX) 20 MG TABLET    Take 20 mg by mouth daily.   LAMOTRIGINE (LAMICTAL) 150 MG TABLET    Take 150 mg by mouth daily.   METOPROLOL TARTRATE (LOPRESSOR) 25 MG TABLET    Take 12.5 mg by mouth daily.   MULTIPLE VITAMIN (MULTIVITAMIN)  TABLET    Take 1 tablet by mouth daily.   OXYCODONE (OXY-IR) 5 MG CAPSULE    Take 1 capsule (5 mg total) by mouth every 4 (four) hours as needed.   PRIMIDONE (MYSOLINE) 50 MG TABLET    Take 25 mg by mouth at bedtime.   ROPINIROLE (REQUIP) 5 MG TABLET    Take 5 mg by mouth at bedtime.   SENNOSIDES-DOCUSATE SODIUM (SENOKOT-S) 8.6-50 MG TABLET    Take 2 tablets by mouth daily.  Modified Medications   No medications on file  Discontinued Medications   No medications on file    Physical Exam:  Physical Exam  Vitals reviewed. Constitutional:  Obese white female  HENT:  Head: Normocephalic and atraumatic.  Cardiovascular: Normal rate, regular rhythm, normal heart sounds and intact distal pulses.   Pulmonary/Chest: Effort normal and breath sounds normal. No respiratory distress.  Abdominal: Soft. Bowel sounds are normal. She exhibits no distension. There is no tenderness.  Musculoskeletal: She exhibits no edema and no tenderness.  Severe resting tremors of the upper extremities bilaterally, weakness of bilateral LEs  Neurological: She is alert.  Skin: Skin is warm and dry. There is pallor.  Has seborrheic dermatitis on her face  Psychiatric:  Flat affect   Assessment/Plan 1. Parkinson disease Continues to  progress.  No changes needed today.    2. Essential hypertension, benign -bp at goal with current therapy  3. A-fib -rate controlled with current therapy, on baby asa  4. Edema -mild, some tenderness of lower legs on exam, no changes needed  5. Major depression -being followed by nceps, is on lamictal  6. Unspecified vitamin D deficiency -f/u levels  Family/ staff Communication: discussed with nursing staff Goals of care:  Full code

## 2013-12-10 ENCOUNTER — Encounter: Payer: Self-pay | Admitting: Internal Medicine

## 2013-12-11 ENCOUNTER — Non-Acute Institutional Stay (SKILLED_NURSING_FACILITY): Payer: Medicare Other | Admitting: Internal Medicine

## 2013-12-11 ENCOUNTER — Encounter: Payer: Self-pay | Admitting: Internal Medicine

## 2013-12-11 DIAGNOSIS — I4891 Unspecified atrial fibrillation: Secondary | ICD-10-CM

## 2013-12-11 DIAGNOSIS — R000 Tachycardia, unspecified: Secondary | ICD-10-CM

## 2013-12-11 DIAGNOSIS — R509 Fever, unspecified: Secondary | ICD-10-CM

## 2013-12-11 NOTE — Progress Notes (Signed)
MRN: [id number] Name: [name] [name]  Sex: female Age: 77 y.o. DOB: [birth year]  PSC #: [id number] Facility/Room: 131B Level Of Care: SNF Provider: Merrilee Seashore D Emergency Contacts: Extended Emergency Contact Information Primary Emergency Contact: Forehand,Bobby E Address: [address]          [place], [place] [address] [place] Home Phone: [phone] Relation: Spouse Secondary Emergency Contact: Way,Cheryl Address: [address], [place] [address] [place] Home Phone: [phone] Relation: Daughter  Code Status: FULL  Allergies: Penicillins  Chief Complaint  Patient presents with  . Acute Visit    HPI: Patient is 77 y.o. female who nurse reported with resp sx and chills and nausea.  Past Medical History  Diagnosis Date  . Vitamin D deficiency   . Edema   . Anemia   . Hypokalemia   . Depression   . Anxiety   . Parkinson disease   . Chronic pain   . Hypertension   . A-fib   . Constipation   . Chronic kidney disease   . UTI (lower urinary tract infection)   . Insomnia   . Intertrochanteric fracture     Past Surgical History  Procedure Laterality Date  . Right hip surgery due to fracture        Medication List       This list is accurate as of: 12/11/13  5:39 PM.  Always use your most recent med list.               antiseptic oral rinse Liqd  15 mLs by Mouth Rinse route 2 (two) times daily.     aspirin 81 MG tablet  Take 81 mg by mouth daily.     busPIRone 15 MG tablet  Commonly known as:  BUSPAR  Take 15 mg by mouth 2 (two) times daily.     carbidopa-levodopa 25-100 MG per tablet  Commonly known as:  SINEMET IR  Take 1 tablet by mouth 4 (four) times daily.     cholecalciferol 1000 UNITS tablet  Commonly known as:  VITAMIN D  Take 1,000 Units by mouth daily.     diltiazem 60 MG tablet  Commonly known as:  CARDIZEM  Take 60 mg by mouth daily.     docusate sodium 100 MG capsule   Commonly known as:  COLACE  Take 100 mg by mouth 2 (two) times daily.     FLUoxetine 20 MG capsule  Commonly known as:  PROZAC  Take 60 mg by mouth daily.     furosemide 20 MG tablet  Commonly known as:  LASIX  Take 20 mg by mouth daily.     lamoTRIgine 150 MG tablet  Commonly known as:  LAMICTAL  Take 150 mg by mouth daily.     metoprolol tartrate 25 MG tablet  Commonly known as:  LOPRESSOR  Take 12.5 mg by mouth daily.     multivitamin tablet  Take 1 tablet by mouth daily.     oxycodone 5 MG capsule  Commonly known as:  OXY-IR  Take 1 capsule (5 mg total) by mouth every 4 (four) hours as needed.     primidone 50 MG tablet  Commonly known as:  MYSOLINE  Take 25 mg by mouth at bedtime.     ropinirole 5 MG tablet  Commonly known as:  REQUIP  Take 5 mg by mouth at bedtime.  sennosides-docusate sodium 8.6-50 MG tablet  Commonly known as:  SENOKOT-S  Take 2 tablets by mouth daily.        No orders of the defined types were placed in this encounter.     There is no immunization history on file for this patient.  History  Substance Use Topics  . Smoking status: Never Smoker   . Smokeless tobacco: Not on file  . Alcohol Use: Not on file    Review of Systems  DATA OBTAINED: from patient, nurse GENERAL: Feels bad, no fevers, fatigue, appetite changes SKIN: No itching, rash HEENT: runny nose RESPIRATORY: No cough, wheezing,no SOB CARDIAC: No chest pain, palpitations, lower extremity edema  GI: No abdominal pain, + nausea, no vomit or diarrhea, just had formed stool;constipation, No heartburn or reflux  GU: No dysuria, frequency or urgency, or incontinence  MUSCULOSKELETAL: No unrelieved bone/joint pain, no muscle aches NEUROLOGIC: No headache, dizziness or focal weakness PSYCHIATRIC: No overt anxiety or sadness. Sleeps well.   Filed Vitals:   12/11/13 1736  BP: 106/69  Pulse: 159  Temp: 97.6 F (36.4 C)  Resp: 21    Physical Exam  GENERAL  APPEARANCE: Alert, conversant. Appropriately groomed, chilling SKIN: No diaphoresis  HEENT: Unremarkable RESPIRATORY: Breathing is even, unlabored. Lung sounds are clear  BEDSIDE O2 SAT 95% RA PER ADAMD, PULSE 160 CARDIOVASCULAR: Heart with inc HR,no murmurs, rubs or gallops. No peripheral edema  GASTROINTESTINAL: Abdomen is soft, non-tender, not distended w/ normal bowel sounds.  GENITOURINARY: Bladder non tender, not distended  MUSCULOSKELETAL: No abnormal joints or musculature, very pronounced essential tremor NEUROLOGIC: Cranial nerves 2-12 grossly intact. Moves all extremities no tremor. PSYCHIATRIC: depressed, complaining  Patient Active Problem List   Diagnosis Date Noted  . Unspecified vitamin D deficiency 04/17/2013  . Major depression 04/17/2013  . Parkinson disease 04/17/2013  . Essential hypertension, benign 04/17/2013  . A-fib 04/17/2013  . Unspecified constipation 04/17/2013  . Tremor, essential 04/17/2013  . Edema 04/17/2013  . Loss of weight 04/17/2013    CBC    Component Value Date/Time   WBC 6.3 11/02/2010 0520   RBC 3.29* 11/02/2010 0520   HGB 12.6 03/03/2011 0443   HCT 37.0 03/03/2011 0443   PLT 384 11/02/2010 0520   MCV 91.6 11/02/2010 0520   LYMPHSABS 1.6 10/24/2010 0902   MONOABS 0.9 10/24/2010 0902   EOSABS 0.0 10/24/2010 0902   BASOSABS 0.0 10/24/2010 0902    CMP     Component Value Date/Time   NA 140 03/03/2011 0443   K 3.6 03/03/2011 0443   CL 103 03/03/2011 0443   CO2 28 11/02/2010 0520   GLUCOSE 109* 03/03/2011 0443   BUN 19 03/03/2011 0443   CREATININE 0.8 03/03/2011 0443   CALCIUM 8.8 11/02/2010 0520   PROT 6.7 10/22/2010 1933   ALBUMIN 3.6 10/22/2010 1933   AST 16 10/22/2010 1933   ALT 10 10/22/2010 1933   ALKPHOS 80 10/22/2010 1933   BILITOT 0.5 10/22/2010 1933   GFRNONAA >60 11/02/2010 0520   GFRAA  Value: >60        The eGFR has been calculated using the MDRD equation. This calculation has not been validated in all clinical situations. eGFR's  persistently <60 mL/min signify possible Chronic Kidney Disease. 11/02/2010 0520    Assessment and Plan  FEBRILE ILLNESS-resp exam benign, O2 sat OK- swab for flu and observe; tylenol 500 q4 h   TACHYCARDIA - no CP, no SOB; stat ECG   Margit Hanks, MD

## 2014-01-30 ENCOUNTER — Non-Acute Institutional Stay (SKILLED_NURSING_FACILITY): Payer: Medicare Other | Admitting: Internal Medicine

## 2014-01-30 DIAGNOSIS — F330 Major depressive disorder, recurrent, mild: Secondary | ICD-10-CM

## 2014-01-30 DIAGNOSIS — R634 Abnormal weight loss: Secondary | ICD-10-CM

## 2014-01-30 DIAGNOSIS — K5900 Constipation, unspecified: Secondary | ICD-10-CM

## 2014-01-30 DIAGNOSIS — G20A1 Parkinson's disease without dyskinesia, without mention of fluctuations: Secondary | ICD-10-CM

## 2014-01-30 DIAGNOSIS — I4891 Unspecified atrial fibrillation: Secondary | ICD-10-CM

## 2014-01-30 DIAGNOSIS — I10 Essential (primary) hypertension: Secondary | ICD-10-CM

## 2014-01-30 DIAGNOSIS — G20 Parkinson's disease: Secondary | ICD-10-CM

## 2014-01-30 DIAGNOSIS — I482 Chronic atrial fibrillation, unspecified: Secondary | ICD-10-CM

## 2014-01-30 NOTE — Progress Notes (Signed)
Patient ID: [name] [name], female   DOB: [birth year], 78 y.o.   MRN: [id number]  Location:  [facility] Provider:  Rexene Edison. Mariea Clonts, D.O., C.M.D.  Code Status:  Full code  Chief Complaint  Patient presents with  . Medical Management of Chronic Issues    HPI:  78 yo white female seen for medical mgt of chronic diseases including Parkinson's disease, depression, chronic constipation, vitamin d deficiency, htn, afib.  Has gained 12 lbs since April 2014.  She has her same complaints of her tremor not getting better.   Review of Systems:  Review of Systems  Constitutional: Positive for malaise/fatigue. Negative for fever.  HENT: Negative for congestion.   Eyes: Negative for blurred vision.  Respiratory: Negative for shortness of breath.   Cardiovascular: Negative for chest pain.  Gastrointestinal: Positive for constipation. Negative for abdominal pain.  Genitourinary: Negative for dysuria.  Musculoskeletal: Negative for falls and myalgias.  Skin: Negative for rash.  Neurological: Positive for tremors and sensory change. Negative for weakness.  Psychiatric/Behavioral: Positive for depression and memory loss.    Medications: Patient's Medications  New Prescriptions   No medications on file  Previous Medications   ANTISEPTIC ORAL RINSE (BIOTENE) LIQD    15 mLs by Mouth Rinse route 2 (two) times daily.   ASPIRIN 81 MG TABLET    Take 81 mg by mouth daily.   CARBIDOPA-LEVODOPA (SINEMET IR) 25-100 MG PER TABLET    Take 1 tablet by mouth 4 (four) times daily. For muscle spasms   CHOLECALCIFEROL (VITAMIN D) 1000 UNITS TABLET    Take 1,000 Units by mouth daily.   DEXTROMETHORPHAN 15 MG/5ML SYRUP    Take 10 mLs by mouth 4 (four) times daily as needed for cough.   DILTIAZEM (CARDIZEM) 60 MG TABLET    Take 60 mg by mouth daily.   DOCUSATE SODIUM (COLACE) 100 MG CAPSULE    Take 100 mg by mouth 2 (two) times daily.   FLUOXETINE (PROZAC) 20 MG CAPSULE    Take 60 mg by mouth daily.  Take 3 tablets to equal 60 mg for anxiety.   FUROSEMIDE (LASIX) 20 MG TABLET    Take 20 mg by mouth daily.   LAMOTRIGINE (LAMICTAL) 150 MG TABLET    Take 150 mg by mouth daily.   METOPROLOL TARTRATE (LOPRESSOR) 25 MG TABLET    Take 25 mg by mouth daily.    MULTIPLE VITAMIN (DAILY VITE) TABS    Take by mouth daily. Take 1 tablet   OXYCODONE HCL, ABUSE DETER, 5 MG TABA    Take 5 mg by mouth every 4 (four) hours. For chronic pain   QUETIAPINE (SEROQUEL) 100 MG TABLET    Take 100 mg by mouth at bedtime. For major depressive disorder   ROPINIROLE (REQUIP) 5 MG TABLET    Take 7.5 mg by mouth at bedtime. For anxiety   SENNOSIDES-DOCUSATE SODIUM (SENOKOT-S) 8.6-50 MG TABLET    Take 2 tablets by mouth daily.  Modified Medications   No medications on file  Discontinued Medications   BUSPIRONE (BUSPAR) 15 MG TABLET    Take 15 mg by mouth 2 (two) times daily.   MULTIPLE VITAMIN (MULTIVITAMIN) TABLET    Take 1 tablet by mouth daily.   OXYCODONE (OXY-IR) 5 MG CAPSULE    Take 1 capsule (5 mg total) by mouth every 4 (four) hours as needed.   PRIMIDONE (MYSOLINE) 50 MG TABLET    Take 25 mg by mouth at bedtime.  Physical Exam: Filed Vitals:   01/30/14 1620  BP: 101/51  Pulse: 85  Temp: 98.3 F (36.8 C)  Resp: 18  Height: 5\' 2"  (1.575 m)  Weight: 138 lb (62.596 kg)  SpO2: 95%   Physical Exam  Constitutional: She is oriented to person, place, and time. She appears well-developed and well-nourished. No distress.  Cardiovascular: Intact distal pulses.   irreg irreg  Pulmonary/Chest: Effort normal and breath sounds normal. No respiratory distress.  Abdominal: Soft. Bowel sounds are normal. She exhibits no distension and no mass. There is no tenderness.  Musculoskeletal: Normal range of motion. She exhibits edema. She exhibits no tenderness.  nonpitting  Neurological: She is alert and oriented to person, place, and time.  Skin: Skin is warm and dry. There is pallor.  Psychiatric:  Flat affect      Labs reviewed: 12/11/13:  EKG afib, HR 116 12/13/13:  Flu neg  Assessment/Plan 1. Parkinson disease D/c primidone as pt has parkinson's not essential tremor -cont sinemet and requip  2. Chronic atrial fibrillation Cont lopressor which has controlled rate and baby aspirin  3. Essential hypertension, benign bp at goal, no changes  4. Major depressive disorder, recurrent episode, mild Cont lamictal, prozac per psychiatry  5. Unspecified constipation Cont colace, senna s, may need miralax occasionally  6. Loss of weight -has now gained weight   Family/ staff Communication: discussed with nursing staff  Goals of care: full code Labs/tests ordered:  Cbc, cmp, flp, vitamin D level

## 2014-02-15 ENCOUNTER — Other Ambulatory Visit: Payer: Self-pay | Admitting: *Deleted

## 2014-02-15 MED ORDER — OXYCODONE HCL 5 MG PO TABS: ORAL_TABLET | ORAL | Status: DC

## 2014-02-15 NOTE — Telephone Encounter (Signed)
[facility] [place] 

## 2014-02-18 ENCOUNTER — Other Ambulatory Visit: Payer: Self-pay | Admitting: *Deleted

## 2014-02-18 MED ORDER — OXYCODONE HCL 5 MG PO TABS: ORAL_TABLET | ORAL | Status: DC

## 2014-02-18 NOTE — Telephone Encounter (Signed)
[facility] [place] 

## 2014-02-20 ENCOUNTER — Non-Acute Institutional Stay (SKILLED_NURSING_FACILITY): Payer: Medicare Other | Admitting: Internal Medicine

## 2014-02-20 DIAGNOSIS — D1620 Benign neoplasm of long bones of unspecified lower limb: Secondary | ICD-10-CM

## 2014-02-20 NOTE — Progress Notes (Signed)
Patient ID: [name] [name], female   DOB: 1936/06/27, 78 y.o.   MRN: [id number]  Location:  [facility]  Provider:  Rexene Edison. Mariea Clonts, D.O., C.M.D.  Code Status:  Full code  Chief Complaint  Patient presents with  . Acute Visit    right knee pain with abnormal xray    HPI:  78 yo female with Parkinson's disease, depression, constipation seen for an acute visit due to right knee pain.  She had xrays done on 02/16/14 for this and they showed moderate degenerative change, but also a sclerotic focus in her mid to distal femur which could be related to enchondroma or bone infarct.   Review of Systems:  Review of Systems  Constitutional: Negative for weight loss.  Respiratory: Negative for shortness of breath.   Cardiovascular: Negative for chest pain.  Gastrointestinal: Positive for constipation. Negative for abdominal pain.  Genitourinary: Negative for dysuria.  Musculoskeletal: Positive for joint pain.  Neurological: Positive for tingling, tremors and weakness. Negative for focal weakness and headaches.  Psychiatric/Behavioral: Positive for depression and memory loss.    Medications: Patient's Medications  New Prescriptions   No medications on file  Previous Medications   ANTISEPTIC ORAL RINSE (BIOTENE) LIQD    15 mLs by Mouth Rinse route 2 (two) times daily.   ASPIRIN 81 MG TABLET    Take 81 mg by mouth daily.   BUSPIRONE (BUSPAR) 15 MG TABLET    Take 15 mg by mouth 2 (two) times daily.   CARBIDOPA-LEVODOPA (SINEMET IR) 25-100 MG PER TABLET    Take 1 tablet by mouth 4 (four) times daily.   CHOLECALCIFEROL (VITAMIN D) 1000 UNITS TABLET    Take 1,000 Units by mouth daily.   DILTIAZEM (CARDIZEM) 60 MG TABLET    Take 60 mg by mouth daily.   DOCUSATE SODIUM (COLACE) 100 MG CAPSULE    Take 100 mg by mouth 2 (two) times daily.   FLUOXETINE (PROZAC) 20 MG CAPSULE    Take 60 mg by mouth daily.   FUROSEMIDE (LASIX) 20 MG TABLET    Take 20 mg by mouth daily.   LAMOTRIGINE  (LAMICTAL) 150 MG TABLET    Take 150 mg by mouth daily.   METOPROLOL TARTRATE (LOPRESSOR) 25 MG TABLET    Take 12.5 mg by mouth daily.   MULTIPLE VITAMIN (MULTIVITAMIN) TABLET    Take 1 tablet by mouth daily.   OXYCODONE (OXY-IR) 5 MG CAPSULE    Take 1 capsule (5 mg total) by mouth every 4 (four) hours as needed.   OXYCODONE (ROXICODONE) 5 MG IMMEDIATE RELEASE TABLET    Take one tablet by mouth every 4 hours as needed for pain   ROPINIROLE (REQUIP) 5 MG TABLET    Take 5 mg by mouth at bedtime.   SENNOSIDES-DOCUSATE SODIUM (SENOKOT-S) 8.6-50 MG TABLET    Take 2 tablets by mouth daily.  Modified Medications   No medications on file  Discontinued Medications   No medications on file    Physical Exam: Filed Vitals:   02/20/14 1338  BP: 108/67  Pulse: 88  Temp: 97.9 F (36.6 C)  Resp: 18   Physical Exam  Constitutional: She appears well-developed and well-nourished. No distress.  White female  Cardiovascular: Normal rate, regular rhythm and normal heart sounds.   Pulmonary/Chest: Effort normal and breath sounds normal. No respiratory distress.  Abdominal: Soft. Bowel sounds are normal. She exhibits no distension and no mass. There is no tenderness.  Musculoskeletal: She exhibits edema.  Tenderness over right thigh near knee, but no warmth, swelling present  Neurological: She is alert. She exhibits abnormal muscle tone.  Resting tremor  Psychiatric:  Flat affect though mood better these last two visits than previously    Significant Diagnostic Results:  Right knee xrays done on 02/16/14 :  No acute fx.   moderate degenerative change, but also a sclerotic focus in her mid to distal femur which could be related to enchondroma or bone infarct.    Assessment/Plan Enchondroma of femur Identified on xray of right knee done due to knee pain.  This vs. Necrosis/bone infarct, but that didn't seem to make sense when it wasn't at the joint.  Cont oxycodone as needed for severe pain.  Should  pain localized to this area get worse or redness and swelling develop, she may need further imaging, but this is typically a benign finding.     Goals of care: long term care resident, full code  Labs/tests ordered:  None at this time

## 2014-02-21 ENCOUNTER — Encounter: Payer: Self-pay | Admitting: Internal Medicine

## 2014-02-21 DIAGNOSIS — D1620 Benign neoplasm of long bones of unspecified lower limb: Secondary | ICD-10-CM | POA: Insufficient documentation

## 2014-02-21 NOTE — Assessment & Plan Note (Signed)
Identified on xray of right knee done due to knee pain.  This vs. Necrosis/bone infarct, but that didn't seem to make sense when it wasn't at the joint.  Cont oxycodone as needed for severe pain.  Should pain localized to this area get worse or redness and swelling develop, she may need further imaging, but this is typically a benign finding.

## 2014-02-22 ENCOUNTER — Encounter: Payer: Self-pay | Admitting: Internal Medicine

## 2014-04-10 ENCOUNTER — Other Ambulatory Visit: Payer: Self-pay | Admitting: *Deleted

## 2014-04-10 MED ORDER — OXYCODONE HCL 5 MG PO TABS: ORAL_TABLET | ORAL | Status: DC

## 2014-04-10 NOTE — Telephone Encounter (Signed)
[facility]

## 2014-04-17 ENCOUNTER — Non-Acute Institutional Stay (SKILLED_NURSING_FACILITY): Payer: Medicare Other | Admitting: Internal Medicine

## 2014-04-17 ENCOUNTER — Encounter: Payer: Self-pay | Admitting: Internal Medicine

## 2014-04-17 DIAGNOSIS — I4891 Unspecified atrial fibrillation: Secondary | ICD-10-CM

## 2014-04-17 DIAGNOSIS — K117 Disturbances of salivary secretion: Secondary | ICD-10-CM

## 2014-04-17 DIAGNOSIS — F330 Major depressive disorder, recurrent, mild: Secondary | ICD-10-CM

## 2014-04-17 DIAGNOSIS — I10 Essential (primary) hypertension: Secondary | ICD-10-CM

## 2014-04-17 DIAGNOSIS — G20A1 Parkinson's disease without dyskinesia, without mention of fluctuations: Secondary | ICD-10-CM

## 2014-04-17 DIAGNOSIS — K5900 Constipation, unspecified: Secondary | ICD-10-CM

## 2014-04-17 DIAGNOSIS — I482 Chronic atrial fibrillation, unspecified: Secondary | ICD-10-CM

## 2014-04-17 DIAGNOSIS — K5909 Other constipation: Secondary | ICD-10-CM

## 2014-04-17 DIAGNOSIS — G20 Parkinson's disease: Secondary | ICD-10-CM

## 2014-04-17 DIAGNOSIS — R682 Dry mouth, unspecified: Secondary | ICD-10-CM

## 2014-04-17 NOTE — Progress Notes (Signed)
Patient ID: [name] [name], female   DOB: 1936-05-26, 78 y.o.   MRN: [id number]  Location:  [facility] Provider:  Rexene Edison. Mariea Clonts, D.O., C.M.D.  Code Status:  Full code  Chief Complaint  Patient presents with  . Medical Management of Chronic Issues    HPI:  78 yo white female long term care resident with parkinson's disease, OA, dry mouth, chronic pain and constipation was seen for med mgt of chronic diseases.  She did not c/o anything today besides her chronic tremor that she does not believe is PD.  Review of Systems:  Review of Systems  Constitutional: Negative for fever.  HENT: Negative for congestion.   Eyes: Negative for blurred vision.  Respiratory: Negative for shortness of breath.   Cardiovascular: Negative for chest pain.  Gastrointestinal: Positive for constipation. Negative for abdominal pain, blood in stool and melena.  Genitourinary: Negative for dysuria.  Musculoskeletal: Positive for joint pain. Negative for falls.  Skin: Negative for rash.  Neurological: Positive for tremors.  Psychiatric/Behavioral: Positive for depression and memory loss.    Medications: Patient's Medications  New Prescriptions   No medications on file  Previous Medications   ANTISEPTIC ORAL RINSE (BIOTENE) LIQD    15 mLs by Mouth Rinse route 2 (two) times daily.   ASPIRIN 81 MG TABLET    Take 81 mg by mouth daily.   BUSPIRONE (BUSPAR) 15 MG TABLET    Take 15 mg by mouth 2 (two) times daily.   CARBIDOPA-LEVODOPA (SINEMET IR) 25-100 MG PER TABLET    Take 1 tablet by mouth 4 (four) times daily.   CHOLECALCIFEROL (VITAMIN D) 1000 UNITS TABLET    Take 1,000 Units by mouth daily.   DILTIAZEM (CARDIZEM) 60 MG TABLET    Take 60 mg by mouth daily.   DOCUSATE SODIUM (COLACE) 100 MG CAPSULE    Take 100 mg by mouth 2 (two) times daily.   FLUOXETINE (PROZAC) 20 MG CAPSULE    Take 60 mg by mouth daily.   FUROSEMIDE (LASIX) 20 MG TABLET    Take 20 mg by mouth daily.   LAMOTRIGINE  (LAMICTAL) 150 MG TABLET    Take 150 mg by mouth daily.   METOPROLOL TARTRATE (LOPRESSOR) 25 MG TABLET    Take 12.5 mg by mouth daily.   MULTIPLE VITAMIN (MULTIVITAMIN) TABLET    Take 1 tablet by mouth daily.   OXYCODONE (OXY-IR) 5 MG CAPSULE    Take 1 capsule (5 mg total) by mouth every 4 (four) hours as needed.   OXYCODONE (ROXICODONE) 5 MG IMMEDIATE RELEASE TABLET    Take one tablet by mouth every 4 hours as needed for pain   ROPINIROLE (REQUIP) 5 MG TABLET    Take 5 mg by mouth at bedtime.   SENNOSIDES-DOCUSATE SODIUM (SENOKOT-S) 8.6-50 MG TABLET    Take 2 tablets by mouth daily.  Modified Medications   No medications on file  Discontinued Medications   No medications on file    Physical Exam: Filed Vitals:   04/17/14 1205  BP: 98/62  Pulse: 87  Temp: 98.1 F (36.7 C)  Resp: 18  Height: 5\' 2"  (1.575 m)  Weight: 134 lb (60.782 kg)  SpO2: 95%  Physical Exam  Constitutional: She is oriented to person, place, and time. She appears well-developed and well-nourished. No distress.  Cardiovascular: Intact distal pulses.   irreg irreg  Pulmonary/Chest: Effort normal and breath sounds normal.  Abdominal: Soft. Bowel sounds are normal. She exhibits no distension  and no mass. There is no tenderness.  Musculoskeletal: She exhibits tenderness.  cogwheeling rigidity present, resting tremor; knees tender with ROM  Neurological: She is alert and oriented to person, place, and time.  Skin: There is pallor.  Psychiatric: She has a normal mood and affect.   Assessment/Plan 1. Parkinson disease -cont sinemet  2. Chronic atrial fibrillation -cont cardizem, asa, lopressor, rate has been controlled  3. Essential hypertension, benign -bp controlled with current meds  4. Major depressive disorder, recurrent episode, mild -cont lamictal with prozac; tapering buspar might help cognition  5. Chronic constipation -cont colace, and senna s, also has standing orders if needed, not helped by her  Parkinson's which affects motility  6. Dry mouth -cont biotene mouthwash  Family/ staff Communication: nursing  Goals of care: full code, is long term care resident  Labs/tests ordered:  Cbc, bmp

## 2014-04-18 LAB — CBC AND DIFFERENTIAL
HCT: 40 % (ref 36–46)
Hemoglobin: 12.9 g/dL (ref 12.0–16.0)
PLATELETS: 295 10*3/uL (ref 150–399)
WBC: 9.2 10*3/mL

## 2014-04-18 LAB — BASIC METABOLIC PANEL
BUN: 23 mg/dL — AB (ref 4–21)
CREATININE: 0.7 mg/dL (ref 0.5–1.1)
GLUCOSE: 96 mg/dL
POTASSIUM: 4.6 mmol/L (ref 3.4–5.3)
Sodium: 144 mmol/L (ref 137–147)

## 2014-07-03 ENCOUNTER — Non-Acute Institutional Stay (SKILLED_NURSING_FACILITY): Payer: Medicare Other | Admitting: Internal Medicine

## 2014-07-03 ENCOUNTER — Encounter: Payer: Self-pay | Admitting: Internal Medicine

## 2014-07-03 DIAGNOSIS — F330 Major depressive disorder, recurrent, mild: Secondary | ICD-10-CM

## 2014-07-03 DIAGNOSIS — E559 Vitamin D deficiency, unspecified: Secondary | ICD-10-CM

## 2014-07-03 DIAGNOSIS — K5901 Slow transit constipation: Secondary | ICD-10-CM

## 2014-07-03 DIAGNOSIS — G20 Parkinson's disease: Secondary | ICD-10-CM

## 2014-07-03 DIAGNOSIS — G20A1 Parkinson's disease without dyskinesia, without mention of fluctuations: Secondary | ICD-10-CM

## 2014-07-03 DIAGNOSIS — I10 Essential (primary) hypertension: Secondary | ICD-10-CM

## 2014-07-03 DIAGNOSIS — I4891 Unspecified atrial fibrillation: Secondary | ICD-10-CM

## 2014-07-03 DIAGNOSIS — I480 Paroxysmal atrial fibrillation: Secondary | ICD-10-CM

## 2014-07-03 NOTE — Progress Notes (Signed)
Patient ID: [name] [name], female   DOB: Sep 20, 1936, 78 y.o.   MRN: [id number]  Location:  [facility]  Provider:  Rexene Edison. Mariea Clonts, D.O., C.M.D.  Code Status:  Full code   Chief Complaint  Patient presents with  . Medical Management of Chronic Issues  . Tremors    tremors are worse and sinemet not helping    HPI:  78 yo white female with h/o Parkinson's disease., vit d deficiency, OA, p afib, major depression and chronic constipation seen for medical mgt of chronic diseases.  Staff tell me she is complaining her tremors are worse and sinemet is not helping.  She still does not believe she has PD.  Previously, she was not helped with essential tremor medications either and has several classic PD features.    Review of Systems:  Review of Systems  Constitutional: Positive for malaise/fatigue. Negative for fever and chills.  HENT: Negative for congestion.   Eyes: Negative for blurred vision.  Respiratory: Negative for shortness of breath.   Cardiovascular: Negative for chest pain.  Gastrointestinal: Negative for abdominal pain.  Genitourinary: Negative for dysuria.  Musculoskeletal: Negative for falls and myalgias.  Skin: Negative for rash.  Neurological: Positive for tremors. Negative for dizziness, loss of consciousness, weakness and headaches.  Endo/Heme/Allergies: Bruises/bleeds easily.  Psychiatric/Behavioral: Positive for depression and memory loss.    Medications: Patient's Medications  New Prescriptions   No medications on file  Previous Medications   ANTISEPTIC ORAL RINSE (BIOTENE) LIQD    15 mLs by Mouth Rinse route 2 (two) times daily.   ASPIRIN 81 MG TABLET    Take 81 mg by mouth daily.   CARBIDOPA-LEVODOPA (SINEMET IR) 25-100 MG PER TABLET    Take 1 tablet by mouth 4 (four) times daily.   CHOLECALCIFEROL (VITAMIN D) 1000 UNITS TABLET    Take 1,000 Units by mouth daily.   DEXTROMETHORPHAN 15 MG/5ML SYRUP    Take 10 mLs by mouth 4 (four) times daily as  needed for cough.   DILTIAZEM (CARDIZEM) 60 MG TABLET    Take 60 mg by mouth daily.   DOCUSATE SODIUM (COLACE) 100 MG CAPSULE    Take 100 mg by mouth 2 (two) times daily.   FLUOXETINE (PROZAC) 20 MG CAPSULE    Take 60 mg by mouth daily.   FUROSEMIDE (LASIX) 20 MG TABLET    Take 20 mg by mouth daily.   LAMOTRIGINE (LAMICTAL) 150 MG TABLET    Take 150 mg by mouth daily.   METOPROLOL TARTRATE (LOPRESSOR) 25 MG TABLET    Take 25 mg by mouth daily.    QUETIAPINE (SEROQUEL) 100 MG TABLET    Take 100 mg by mouth at bedtime.   ROPINIROLE (REQUIP) 5 MG TABLET    Take 5 mg by mouth at bedtime.   SENNOSIDES-DOCUSATE SODIUM (SENOKOT-S) 8.6-50 MG TABLET    Take 2 tablets by mouth daily.  Modified Medications   No medications on file  Discontinued Medications   BUSPIRONE (BUSPAR) 15 MG TABLET    Take 15 mg by mouth 2 (two) times daily.   MULTIPLE VITAMIN (MULTIVITAMIN) TABLET    Take 1 tablet by mouth daily.   OXYCODONE (OXY-IR) 5 MG CAPSULE    Take 1 capsule (5 mg total) by mouth every 4 (four) hours as needed.   OXYCODONE (ROXICODONE) 5 MG IMMEDIATE RELEASE TABLET    Take one tablet by mouth every 4 hours as needed for pain    Physical Exam: Danley Danker  Vitals:   07/03/14 1550  BP: 118/58  Pulse: 78  Temp: 98.2 F (36.8 C)  Resp: 18  Height: 5\' 2"  (1.575 m)  Weight: 129 lb (58.514 kg)  SpO2: 95%  Physical Exam  Constitutional: She appears well-developed and well-nourished. No distress.  Cardiovascular: Normal rate, regular rhythm, normal heart sounds and intact distal pulses.   Pulmonary/Chest: Effort normal and breath sounds normal. No respiratory distress.  Abdominal: Soft. Bowel sounds are normal. She exhibits no distension. There is no tenderness.  Neurological: She is alert.  Oriented to person and place, not time  Skin: Skin is warm and dry. There is pallor.  Psychiatric: She has a normal mood and affect.    Labs reviewed: 04/18/14:  Wbc 9.2, h/h 12.9/39.5, plt 295, glucose 96, BUN 22.5,  cr 0.69  Assessment/Plan 1. Parkinson disease -pt is still not convinced she has this -continues on sinemet and requip -due to worsening of tremors and pt's disbelief about her diagnosis, will refer to neurology for further evaluation  2. Paroxysmal atrial fibrillation -cont lopressor, cardizem for rate control and baby aspirin -fall risk great with PD   3. Essential hypertension, benign -bp at goal with current meds  4. Major depressive disorder, recurrent episode, mild -continues with prozac, lamictal and seroquel as per psych  5. Slow transit constipation -cont senna s and prns are standing orders  6. Unspecified vitamin D deficiency -cont vitamin d supplement  Family/ staff Communication: discussed with nursing staff  Goals of care:  Full code, long term care resident  Labs/tests ordered:  Referral to [facility] Neurology

## 2014-07-10 ENCOUNTER — Encounter: Payer: Self-pay | Admitting: Internal Medicine

## 2014-07-10 ENCOUNTER — Non-Acute Institutional Stay (SKILLED_NURSING_FACILITY): Payer: Medicare Other | Admitting: Internal Medicine

## 2014-07-10 DIAGNOSIS — L259 Unspecified contact dermatitis, unspecified cause: Secondary | ICD-10-CM

## 2014-07-10 NOTE — Progress Notes (Signed)
Patient ID: [name] [name], female   DOB: 04-Oct-1936, 78 y.o.   MRN: [id number]  Location:  [facility] Provider:  Rexene Edison. Mariea Clonts, D.O., C.M.D.  Code Status:  Full  Chief Complaint  Patient presents with  . Acute Visit    rash on upper back, itchy    HPI:  78 yo white female seen for acute visit due to rash on her upper back that is pruritic.  She is not on new medications.  She is not on abx.  As far as we can tell, the CNAs have not changed the soaps, lotions, shampoos they have used on her and laundry has not changed the detergent.   Review of Systems:  Review of Systems  Constitutional: Positive for malaise/fatigue. Negative for fever.  HENT: Negative for congestion.   Eyes: Negative for blurred vision.  Respiratory: Negative for shortness of breath.   Cardiovascular: Negative for chest pain.  Gastrointestinal: Negative for abdominal pain.  Genitourinary: Negative for dysuria.  Musculoskeletal: Negative for falls.  Skin: Positive for itching and rash.  Neurological: Positive for tremors and weakness.  Psychiatric/Behavioral: Positive for memory loss.    Medications: Patient's Medications  New Prescriptions   No medications on file  Previous Medications   ANTISEPTIC ORAL RINSE (BIOTENE) LIQD    15 mLs by Mouth Rinse route 2 (two) times daily.   ASPIRIN 81 MG TABLET    Take 81 mg by mouth daily.   CARBIDOPA-LEVODOPA (SINEMET IR) 25-100 MG PER TABLET    Take 1 tablet by mouth 4 (four) times daily. For muscle spasms   CHOLECALCIFEROL (VITAMIN D) 1000 UNITS TABLET    Take 1,000 Units by mouth daily.   DEXTROMETHORPHAN 15 MG/5ML SYRUP    Take 10 mLs by mouth 4 (four) times daily as needed for cough.   DILTIAZEM (CARDIZEM) 60 MG TABLET    Take 60 mg by mouth daily.   DOCUSATE SODIUM (COLACE) 100 MG CAPSULE    Take 100 mg by mouth 2 (two) times daily.   FLUOXETINE (PROZAC) 20 MG CAPSULE    Take 60 mg by mouth daily. Take 3 tablets to equal 60 mg for anxiety.   FUROSEMIDE (LASIX) 20 MG TABLET    Take 20 mg by mouth daily.   LAMOTRIGINE (LAMICTAL) 150 MG TABLET    Take 150 mg by mouth daily.   METOPROLOL TARTRATE (LOPRESSOR) 25 MG TABLET    Take 25 mg by mouth daily.    MULTIPLE VITAMIN (DAILY VITE) TABS    Take by mouth daily. Take 1 tablet   OXYCODONE HCL, ABUSE DETER, 5 MG TABA    Take 5 mg by mouth every 4 (four) hours. For chronic pain   QUETIAPINE (SEROQUEL) 100 MG TABLET    Take 100 mg by mouth at bedtime. For major depressive disorder   ROPINIROLE (REQUIP) 5 MG TABLET    Take 7.5 mg by mouth at bedtime. For anxiety   SENNOSIDES-DOCUSATE SODIUM (SENOKOT-S) 8.6-50 MG TABLET    Take 2 tablets by mouth daily.  Modified Medications   No medications on file  Discontinued Medications   No medications on file    Physical Exam: Filed Vitals:   07/10/14 1432  BP: 134/70  Pulse: 78  Temp: 99.2 F (37.3 C)  Resp: 19  Height: 5\' 2"  (1.575 m)  Weight: 128 lb (58.06 kg)  SpO2: 95%  Physical Exam  Constitutional: She appears well-developed and well-nourished. No distress.  Cardiovascular: Normal rate, regular rhythm and normal  heart sounds.   Pulmonary/Chest: Effort normal and breath sounds normal. No respiratory distress.  Neurological: She exhibits abnormal muscle tone.  Tremor and cogwheeling present  Skin: Skin is warm and dry. Rash noted. There is erythema.  Maculopapular rash on upper back with excoriation where she could reach    Assessment/Plan 1. Contact dermatitis -suspected cause, but source unclear -will try hydrocortisone 1% qid for 2 wks to affected area on upper back and monitor -try to avoid oral anti-itch meds due to side effects in her age group  Family/ staff Communication: discussed with her nurse

## 2014-07-29 ENCOUNTER — Encounter: Payer: Self-pay | Admitting: Internal Medicine

## 2014-09-13 ENCOUNTER — Encounter: Payer: Self-pay | Admitting: Neurology

## 2014-09-13 ENCOUNTER — Institutional Professional Consult (permissible substitution): Payer: Medicare Other | Admitting: Neurology

## 2014-09-13 DIAGNOSIS — R269 Unspecified abnormalities of gait and mobility: Secondary | ICD-10-CM | POA: Insufficient documentation

## 2014-10-15 ENCOUNTER — Other Ambulatory Visit: Payer: Self-pay | Admitting: *Deleted

## 2014-10-15 MED ORDER — OXYCODONE HCL 5 MG PO TABS: ORAL_TABLET | ORAL | Status: DC

## 2014-10-15 NOTE — Telephone Encounter (Signed)
[facility]  

## 2014-10-16 ENCOUNTER — Encounter: Payer: Self-pay | Admitting: Internal Medicine

## 2014-10-16 ENCOUNTER — Non-Acute Institutional Stay (SKILLED_NURSING_FACILITY): Payer: Medicare Other | Admitting: Internal Medicine

## 2014-10-16 DIAGNOSIS — G20 Parkinson's disease: Secondary | ICD-10-CM

## 2014-10-16 DIAGNOSIS — G4701 Insomnia due to medical condition: Secondary | ICD-10-CM

## 2014-10-16 DIAGNOSIS — K5909 Other constipation: Secondary | ICD-10-CM

## 2014-10-16 DIAGNOSIS — F330 Major depressive disorder, recurrent, mild: Secondary | ICD-10-CM

## 2014-10-16 DIAGNOSIS — K5900 Constipation, unspecified: Secondary | ICD-10-CM

## 2014-10-16 DIAGNOSIS — I480 Paroxysmal atrial fibrillation: Secondary | ICD-10-CM

## 2014-10-16 NOTE — Progress Notes (Signed)
Patient ID: [name] [name], female   DOB: [birth year], 78 y.o.   MRN: [id number]   Place of Service:[facility]  Allergies  Allergen Reactions   Penicillins     Code Status: Full Code  Goals of Care: Longevity/ Long term care  Chief Complaint  Patient presents with   Medical Management of Chronic Issues    PD, depression, insomnia, constipation    HPI 78 y.o. female with PMH of PD, depression, insomnia, chronic constipation among others is being seen for a routine visit. Resident states that she is not sleeping well and having trouble with both falling asleep and staying at sleep. Feels like Seroquel is not helping to help her sleep anymore even though it helps calm her down. She also reports having constipation, adequate relief with miralax and dulcolax suppository. However, she does not want to be on laxative forever. Otherwise, no other concerns/problems reported. No falls and skin issues reported. No concerns from staff.   Review of Systems Constitutional: Negative for fever, chills, and fatigue. HENT: Negative for ear pain, congestion, and sore throat Eyes: Negative for eye pain, eye discharge, and visual disturbance  Cardiovascular: Negative for chest pain, palpitations, and leg swelling Respiratory: Negative cough, shortness of breath, and wheezing.  Gastrointestinal: Negative for nausea and vomiting. Negative for abdominal pain, diarrhea. Positive for constipation.  Genitourinary: Negative for  Dysuria and hematuria Musculoskeletal: Negative for back pain, joint pain, and joint swelling  Neurological: Negative for dizziness, headache. Positive for tremors  Skin: Negative for rash and wound.   Psychiatric: Negative for  agitation and suicidal ideas. Positive for anxious/nervousness and depression  Past Medical History  Diagnosis Date   Vitamin D deficiency    Edema    Anemia    Hypokalemia    Depression    Anxiety    Parkinson disease     Chronic pain    Hypertension    A-fib    Constipation    Chronic kidney disease    UTI (lower urinary tract infection)    Insomnia    Intertrochanteric fracture    Gait disturbance     Past Surgical History  Procedure Laterality Date   Right hip surgery due to fracture      History   Social History   Marital Status: Married    Spouse Name: N/A    Number of Children: N/A   Years of Education: N/A   Occupational History   Not on file.   Social History Main Topics   Smoking status: Never Smoker    Smokeless tobacco: Never Used   Alcohol Use: No   Drug Use: No   Sexual Activity: Not on file   Other Topics Concern   Not on file   Social History Narrative   No narrative on file      Medication List       This list is accurate as of: 10/16/14 12:21 PM.  Always use your most recent med list.               antiseptic oral rinse Liqd  15 mLs by Mouth Rinse route 2 (two) times daily.     aspirin 81 MG tablet  Take 81 mg by mouth daily.     carbidopa-levodopa 25-100 MG per tablet  Commonly known as:  SINEMET IR  Take 1 tablet by mouth 4 (four) times daily. For muscle spasms     cholecalciferol 1000 UNITS tablet  Commonly known as:  VITAMIN D  Take 1,000 Units by mouth daily.     DAILY VITE Tabs  Take by mouth daily. Take 1 tablet     dextromethorphan 15 MG/5ML syrup  Take 10 mLs by mouth 4 (four) times daily as needed for cough.     diltiazem 60 MG tablet  Commonly known as:  CARDIZEM  Take 60 mg by mouth daily.     docusate sodium 100 MG capsule  Commonly known as:  COLACE  Take 100 mg by mouth 2 (two) times daily.     FLUoxetine 20 MG capsule  Commonly known as:  PROZAC  Take 60 mg by mouth daily. Take 3 tablets to equal 60 mg for anxiety.     furosemide 20 MG tablet  Commonly known as:  LASIX  Take 20 mg by mouth daily.     lamoTRIgine 150 MG tablet  Commonly known as:  LAMICTAL  Take 150 mg by mouth daily.      metoprolol tartrate 25 MG tablet  Commonly known as:  LOPRESSOR  Take 25 mg by mouth daily.     mirtazapine 7.5 MG tablet  Commonly known as:  REMERON  7.5 mg daily.     OxyCODONE HCl (Abuse Deter) 5 MG Taba  Take 5 mg by mouth every 4 (four) hours. For chronic pain     oxyCODONE 5 MG immediate release tablet  Commonly known as:  ROXICODONE  Take one tablet by mouth every 4 hours as needed for pain     promethazine 25 MG tablet  Commonly known as:  PHENERGAN  25 mg daily.     QUEtiapine 100 MG tablet  Commonly known as:  SEROQUEL  Take 100 mg by mouth at bedtime. For major depressive disorder     ropinirole 5 MG tablet  Commonly known as:  REQUIP  Take 7.5 mg by mouth at bedtime. For anxiety     sennosides-docusate sodium 8.6-50 MG tablet  Commonly known as:  SENOKOT-S  Take 2 tablets by mouth daily.        Physical Exam Filed Vitals:   10/16/14 1215  BP: 101/58  Pulse: 89  Temp: 98.4 F (36.9 C)  Resp: 20   Constitutional: WDWN elderly female in no acute distress.  HEENT: Normocephalic and atraumatic. PERRL. EOM intact. No icterus. External auditory canals patent, auricles without lesions. No nasal discharge or sinus tenderness. Oral mucosa moist. Posterior pharynx clear of any exudate or lesions. Teeth and gingiva in good general condition.  Neck: Supple and nontender. No lymphadenopathy, masses, or thyromegaly. No JVD or carotid bruits. Cardiac: Normal S1, S2. RRR without appreciable murmurs, rubs, or gallops. Intact pulses intact. No dependent edema.  Lungs: No respiratory distress. Breath sounds clear bilaterally without rales, rhonchi, or wheezes. Abdomen: Audible bowel sounds in all quadrants. Soft, nontender, nondistended. No palpable mass.  Musculoskeletal:  No joint erythema or tenderness. Spine and Back: No tenderness over spines. No CVA tenderness.  Skin: Warm and dry. No rash noted. No erythema.  Neurological: Alert and oriented to person. No focal  deficits. Hand tremors noted Psychiatric: Judgment and insight adequate. Appropriate mood and affect.    Assessment & Plan 1. Parkinson disease Persists. Continue Senemet 25/100mg  four times daily and requip 5mg  daily. Continue to monitor. Continue fall and PU precautions.   2. Chronic constipation Persists. Continue colace 100mg  twice daily and senna 8.6/50mg  2 tabs daily at bedtime. Continue facility's protocol for bowel regimen. Continue to monitor for now. Encourage increasing fluid intake and out  of bed/participation in therapy as tolerated.   3. Paroxysmal atrial fibrillation Stable. In NSR. Continue lopressor 25mg  daily and cardiazem 60mg  daily for rate control. Continue aspirin 81mg  daily. Continue to monitor  4. Major depressive disorder, recurrent episode, mild Stable. Continue with prozac 60mg  daily, lamictal 150mg  daily in the morning and 50mg  daily in evening, and seroquel 100mg  daily  5. Insomnia Increase remeron from 7.5mg  daily to 15mg  daily at bedtime. Reassess and continue to monitor.    Family/Staff Communication Plan of care discuss with resident and professional staff members. Resident and professional staff members verbalize understanding and agree with plan of care. No additional questions or concerns reported.    Arthur Holms, MSN, AGNP-C [facility] [address] [place], [place] [address] [phone] [8am-5pm] After hours: [phone]

## 2014-11-18 ENCOUNTER — Non-Acute Institutional Stay (SKILLED_NURSING_FACILITY): Payer: Medicare Other | Admitting: Nurse Practitioner

## 2014-11-18 DIAGNOSIS — K5900 Constipation, unspecified: Secondary | ICD-10-CM

## 2014-11-18 DIAGNOSIS — R634 Abnormal weight loss: Secondary | ICD-10-CM

## 2014-11-18 DIAGNOSIS — E559 Vitamin D deficiency, unspecified: Secondary | ICD-10-CM

## 2014-11-18 DIAGNOSIS — F330 Major depressive disorder, recurrent, mild: Secondary | ICD-10-CM

## 2014-11-18 DIAGNOSIS — I480 Paroxysmal atrial fibrillation: Secondary | ICD-10-CM

## 2014-11-18 DIAGNOSIS — G20 Parkinson's disease: Secondary | ICD-10-CM

## 2014-11-18 DIAGNOSIS — I10 Essential (primary) hypertension: Secondary | ICD-10-CM

## 2014-11-18 NOTE — Progress Notes (Signed)
Patient ID: [name] [name], female   DOB: [birth year], 78 y.o.   MRN: [id number]    Nursing Home Location:  [place] of Service: SNF (31)  PCP: REED, TIFFANY, DO  Allergies  Allergen Reactions  . Penicillins     Chief Complaint  Patient presents with  . Medical Management of Chronic Issues    HPI:  Patient is a 78 y.o. female seen today at [facility] for routine follow on chronic conditions.  Pt with a PMH of PD, depression, insomnia, chronic constipation among others. Pt has no complaints. She reports improvement with insomnia with increase in Remeron. Chronic conditions have all been stable in the last month.   Review of Systems:  Review of Systems  Constitutional: Negative for activity change, appetite change, fatigue and unexpected weight change.  HENT: Negative for congestion and hearing loss.   Eyes: Negative.   Respiratory: Negative for cough and shortness of breath.   Cardiovascular: Negative for chest pain, palpitations and leg swelling.  Gastrointestinal: Positive for constipation (controlled on medications ). Negative for abdominal pain and diarrhea.  Genitourinary: Negative for dysuria and difficulty urinating.  Musculoskeletal: Negative for myalgias and arthralgias.  Skin: Negative for color change and wound.  Neurological: Positive for tremors. Negative for dizziness and weakness.  Psychiatric/Behavioral: Positive for sleep disturbance (improved with medications). Negative for behavioral problems, confusion and agitation.    Past Medical History  Diagnosis Date  . Vitamin D deficiency   . Edema   . Anemia   . Hypokalemia   . Depression   . Anxiety   . Parkinson disease   . Chronic pain   . Hypertension   . A-fib   . Constipation   . Chronic kidney disease   . UTI (lower urinary tract infection)   . Insomnia   . Intertrochanteric fracture   . Gait disturbance    Past Surgical History  Procedure  Laterality Date  . Right hip surgery due to fracture     Social History:   reports that she has never smoked. She has never used smokeless tobacco. She reports that she does not drink alcohol or use illicit drugs.  Family History  Problem Relation Age of Onset  . Cancer Mother   . Cancer Father     Medications: Patient's Medications  New Prescriptions   No medications on file  Previous Medications   ANTISEPTIC ORAL RINSE (BIOTENE) LIQD    15 mLs by Mouth Rinse route 2 (two) times daily.   ASPIRIN 81 MG TABLET    Take 81 mg by mouth daily.   CARBIDOPA-LEVODOPA (SINEMET IR) 25-100 MG PER TABLET    Take 1 tablet by mouth 4 (four) times daily. For muscle spasms   CHOLECALCIFEROL (VITAMIN D) 1000 UNITS TABLET    Take 1,000 Units by mouth daily.   DEXTROMETHORPHAN 15 MG/5ML SYRUP    Take 10 mLs by mouth 4 (four) times daily as needed for cough.   DILTIAZEM (CARDIZEM) 60 MG TABLET    Take 60 mg by mouth daily.   DOCUSATE SODIUM (COLACE) 100 MG CAPSULE    Take 100 mg by mouth 2 (two) times daily.   FLUOXETINE (PROZAC) 20 MG CAPSULE    Take 60 mg by mouth daily. Take 3 tablets to equal 60 mg for anxiety.   FUROSEMIDE (LASIX) 20 MG TABLET    Take 20 mg by mouth daily.   LAMOTRIGINE (LAMICTAL) 150 MG TABLET  Take 150 mg by mouth daily.   METOPROLOL TARTRATE (LOPRESSOR) 25 MG TABLET    Take 25 mg by mouth daily.    MIRTAZAPINE (REMERON) 15 MG TABLET    Take 15 mg by mouth at bedtime.   MULTIPLE VITAMIN (DAILY VITE) TABS    Take by mouth daily. Take 1 tablet   OXYCODONE (ROXICODONE) 5 MG IMMEDIATE RELEASE TABLET    Take one tablet by mouth every 4 hours as needed for pain   OXYCODONE HCL, ABUSE DETER, 5 MG TABA    Take 5 mg by mouth every 4 (four) hours. For chronic pain   PROMETHAZINE (PHENERGAN) 25 MG TABLET    25 mg daily.   QUETIAPINE (SEROQUEL) 100 MG TABLET    Take 100 mg by mouth at bedtime. For major depressive disorder   ROPINIROLE (REQUIP) 5 MG TABLET    Take 7.5 mg by mouth at  bedtime. For anxiety   SENNOSIDES-DOCUSATE SODIUM (SENOKOT-S) 8.6-50 MG TABLET    Take 2 tablets by mouth daily.  Modified Medications   No medications on file  Discontinued Medications   MIRTAZAPINE (REMERON) 7.5 MG TABLET    7.5 mg daily.     Physical Exam: Filed Vitals:   11/18/14 1208  BP: 126/67  Pulse: 89  Temp: 98 F (36.7 C)  Resp: 18  Weight: 125 lb (56.7 kg)    Physical Exam  Constitutional: She appears well-developed and well-nourished. No distress.  Cardiovascular: Normal rate, regular rhythm, normal heart sounds and intact distal pulses.   Pulmonary/Chest: Effort normal and breath sounds normal. No respiratory distress.  Abdominal: Soft. Bowel sounds are normal. She exhibits no distension. There is no tenderness.  Neurological: She is alert.  Oriented to person and place, not time  Skin: Skin is warm and dry.  Psychiatric: She has a normal mood and affect.    Labs reviewed: Basic Metabolic Panel:  Recent Labs  04/18/14  NA 144  K 4.6  BUN 23*  CREATININE 0.7   Liver Function Tests: No results for input(s): AST, ALT, ALKPHOS, BILITOT, PROT, ALBUMIN in the last 8760 hours. No results for input(s): LIPASE, AMYLASE in the last 8760 hours. No results for input(s): AMMONIA in the last 8760 hours. CBC:  Recent Labs  04/18/14  WBC 9.2  HGB 12.9  HCT 40  PLT 295   TSH: No results for input(s): TSH in the last 8760 hours. A1C: No results found for: HGBA1C Lipid Panel: No results for input(s): CHOL, HDL, LDLCALC, TRIG, CHOLHDL, LDLDIRECT in the last 8760 hours.    Assessment/Plan 1. Essential hypertension, benign Patient is stable; continue current regimen. Will monitor and make changes as necessary. Will follow up cmp 2. Paroxysmal atrial fibrillation Rate controlled, to cont on current medications and ASA daily Will follow up cbc  3. Constipation, unspecified constipation type -controlled on current medications  4. Parkinson disease -conts  sinement and requip  5. Vitamin D deficiency conts on supplement, will follow up vit d   6. Major depressive disorder, recurrent episode, mild -stable mood, will cont current medications -conts to be followed by psych  7. Loss of weight -remains on remeron, weights followed by RD   Labs/tests ordered

## 2014-12-02 LAB — CBC AND DIFFERENTIAL
HEMATOCRIT: 33 % — AB (ref 36–46)
Hemoglobin: 9.9 g/dL — AB (ref 12.0–16.0)
PLATELETS: 428 10*3/uL — AB (ref 150–399)
WBC: 10 10^3/mL

## 2014-12-31 ENCOUNTER — Non-Acute Institutional Stay (SKILLED_NURSING_FACILITY): Payer: Medicare Other | Admitting: Adult Health

## 2014-12-31 ENCOUNTER — Encounter: Payer: Self-pay | Admitting: Adult Health

## 2014-12-31 DIAGNOSIS — I480 Paroxysmal atrial fibrillation: Secondary | ICD-10-CM | POA: Diagnosis not present

## 2014-12-31 DIAGNOSIS — R609 Edema, unspecified: Secondary | ICD-10-CM | POA: Diagnosis not present

## 2014-12-31 DIAGNOSIS — I10 Essential (primary) hypertension: Secondary | ICD-10-CM

## 2014-12-31 DIAGNOSIS — G20 Parkinson's disease: Secondary | ICD-10-CM

## 2014-12-31 DIAGNOSIS — G4700 Insomnia, unspecified: Secondary | ICD-10-CM

## 2014-12-31 DIAGNOSIS — F330 Major depressive disorder, recurrent, mild: Secondary | ICD-10-CM | POA: Diagnosis not present

## 2014-12-31 NOTE — Progress Notes (Signed)
Patient ID: [name] [name], female   DOB: 1936/12/01, 79 y.o.   MRN: [id number]  [facility]     Allergies  Allergen Reactions  . Penicillins        Chief Complaint  Patient presents with  . Medical Management of Chronic Issues    HPI:  She is a long term resident of this facility being seen for the management of her chronic illnesses. She continues to have issues with insomnia. She states she watches tv all night and is unable to sleep during the day time. She doe snot get out of bed every day. We discussed the importance of her getting out of bed every day. She states that she would get out of bed daily; if this would help her to sleep at night. There are no nursing concerns being voiced today.      Past Medical History  Diagnosis Date  . Vitamin D deficiency   . Edema   . Anemia   . Hypokalemia   . Depression   . Anxiety   . Parkinson disease   . Chronic pain   . Hypertension   . A-fib   . Constipation   . Chronic kidney disease   . UTI (lower urinary tract infection)   . Insomnia   . Intertrochanteric fracture   . Gait disturbance     Past Surgical History  Procedure Laterality Date  . Right hip surgery due to fracture      VITAL SIGNS BP 134/62 mmHg  Pulse 68  Ht 5\' 2"  (1.575 m)  Wt 130 lb (58.968 kg)  BMI 23.77 kg/m2  SpO2 98%   Outpatient Encounter Prescriptions as of 12/31/2014  Medication Sig  . antiseptic oral rinse (BIOTENE) LIQD 15 mLs by Mouth Rinse route 2 (two) times daily.  Marland Kitchen aspirin 81 MG tablet Take 81 mg by mouth daily.  . carbidopa-levodopa (SINEMET IR) 25-100 MG per tablet Take 1 tablet by mouth 4 (four) times daily. For muscle spasms  . cholecalciferol (VITAMIN D) 1000 UNITS tablet Take 1,000 Units by mouth daily.  Marland Kitchen dextromethorphan 15 MG/5ML syrup Take 10 mLs by mouth 4 (four) times daily as needed for cough.  . diltiazem (CARDIZEM) 60 MG tablet Take 60 mg by mouth daily.  Marland Kitchen docusate sodium (COLACE) 100 MG capsule Take 100 mg by  mouth 2 (two) times daily.  Marland Kitchen FLUoxetine (PROZAC) 20 MG capsule Take 60 mg by mouth daily. Take 3 tablets to equal 60 mg for anxiety.  . furosemide (LASIX) 20 MG tablet Take 20 mg by mouth daily.  Marland Kitchen lamoTRIgine (LAMICTAL) 150 MG tablet Take 150 mg by mouth daily.  . Melatonin 3 MG CAPS Take 6 mg by mouth at bedtime.  . metoprolol tartrate (LOPRESSOR) 25 MG tablet Take 25 mg by mouth daily.   . mirtazapine (REMERON) 15 MG tablet Take 15 mg by mouth at bedtime.  . Multiple Vitamin (DAILY VITE) TABS Take by mouth daily. Take 1 tablet  . oxyCODONE (ROXICODONE) 5 MG immediate release tablet Take one tablet by mouth every 4 hours as needed for pain  . promethazine (PHENERGAN) 25 MG tablet 25 mg daily.  . QUEtiapine (SEROQUEL) 100 MG tablet Take 100 mg by mouth at bedtime. For major depressive disorder  . ropinirole (REQUIP) 5 MG tablet Take 5 mg by mouth at bedtime. For anxiety  . sennosides-docusate sodium (SENOKOT-S) 8.6-50 MG tablet Take 2 tablets by mouth daily.  . Suvorexant (BELSOMRA) 5 MG TABS Take 5 mg by mouth  at bedtime.  . [DISCONTINUED] OxyCODONE HCl, Abuse Deter, 5 MG TABA Take 5 mg by mouth every 4 (four) hours. For chronic pain     SIGNIFICANT DIAGNOSTIC EXAMS    LABS REVIEWED:   11-19-14: wbc 10.0; hgb 10.4; hct 36.0; mcv 88.7; plt 423 glucose 87; bun 20.9; creat 0.87 ;k+5.0; na++141; liver normal albumin 3.6 vit d 19.27  12-02-14: wbc 10.0; hgb 9.9; hct 32.7; mcv 87.4 ;plt 428    Review of Systems  Constitutional: Negative for malaise/fatigue.  Respiratory: Negative for cough and shortness of breath.   Cardiovascular: Negative for chest pain and leg swelling.  Gastrointestinal: Negative for heartburn, abdominal pain and constipation.  Musculoskeletal: Negative for myalgias and joint pain.  Skin: Negative.   Neurological: Negative for headaches.  Psychiatric/Behavioral: Positive for depression. The patient is nervous/anxious and has insomnia.      Physical Exam    Constitutional: No distress.  Thin   Neck: Neck supple. No JVD present. No thyromegaly present.  Cardiovascular: Normal rate and intact distal pulses.   Heart rate irregular   Respiratory: Effort normal and breath sounds normal. No respiratory distress.  GI: Soft. She exhibits no distension.  Musculoskeletal: She exhibits no edema.  Is able to move all extremities Has bilateral resting tremors present   Neurological: She is alert.  Skin: Skin is warm and dry. She is not diaphoretic.       ASSESSMENT/ PLAN:  1. Parkinson disease: no change in status will continue sinemet 25/100 mg four times daily and requip 5 mg daily and will monitor   2. Afib: heart rate is stable will continue lopressor 25 mg daily and cardizem 60 mg daily for rate control and asa 81 mg daily   3. Hypertension: is stable will continue lopressor 25 mg daily and cardizem 50 mg daily   4. Constipation: will continue senna s 2 tabs daily and colace twice daily   5. Edema: is stable will continue las ix 20 mg daily  6. Major depression: she is followed by [facility]; will continue prozac 60 mg daily; lamictal 150 mg daily to help stabilize mood;  And seroquel 100 mg nightly will monitor  7. Insomnia: is significant: will continue melatonin 6 mg nightly; remeron 15 mg nightly and belsomra 5 mg nightly and will monitor    6.     Ok Edwards NP [facility]  Contact [phone] Monday through Friday 8am- 5pm  After hours call [phone]

## 2015-02-23 ENCOUNTER — Non-Acute Institutional Stay (SKILLED_NURSING_FACILITY): Payer: Medicare Other | Admitting: Internal Medicine

## 2015-02-23 DIAGNOSIS — G20 Parkinson's disease: Secondary | ICD-10-CM | POA: Diagnosis not present

## 2015-02-23 DIAGNOSIS — D6489 Other specified anemias: Secondary | ICD-10-CM

## 2015-02-23 DIAGNOSIS — I10 Essential (primary) hypertension: Secondary | ICD-10-CM | POA: Diagnosis not present

## 2015-02-23 DIAGNOSIS — I480 Paroxysmal atrial fibrillation: Secondary | ICD-10-CM | POA: Diagnosis not present

## 2015-02-23 DIAGNOSIS — R609 Edema, unspecified: Secondary | ICD-10-CM

## 2015-02-23 DIAGNOSIS — G4700 Insomnia, unspecified: Secondary | ICD-10-CM

## 2015-02-26 ENCOUNTER — Encounter: Payer: Self-pay | Admitting: Internal Medicine

## 2015-02-26 DIAGNOSIS — D6489 Other specified anemias: Secondary | ICD-10-CM | POA: Insufficient documentation

## 2015-02-26 NOTE — Progress Notes (Signed)
Patient ID: [name] [name], female   DOB: 1936-04-27, 79 y.o.   MRN: [id number]    Facility  [facility]    Place of Service:   SNF   Allergies  Allergen Reactions  . Penicillins     Chief Complaint  Patient presents with  . Medical Management of Chronic Issues    HPI:  79 yo female long term resident seen today for f/u. Her edema is stable. She has a parkinson's tremor but it is stable. Chronic pain is controlled with roxicodone. She still has issues with sleeping but it is improved with belsomra and melatonin. afib is controlled with cardizem. No other concerns. No nursing issues.  Past Medical History  Diagnosis Date  . Vitamin D deficiency   . Edema   . Anemia   . Hypokalemia   . Depression   . Anxiety   . Parkinson disease   . Chronic pain   . Hypertension   . A-fib   . Constipation   . Chronic kidney disease   . UTI (lower urinary tract infection)   . Insomnia   . Intertrochanteric fracture   . Gait disturbance    Past Surgical History  Procedure Laterality Date  . Right hip surgery due to fracture     History   Social History  . Marital Status: Married    Spouse Name: N/A  . Number of Children: N/A  . Years of Education: N/A   Social History Main Topics  . Smoking status: Never Smoker   . Smokeless tobacco: Never Used  . Alcohol Use: No  . Drug Use: No  . Sexual Activity: Not on file   Other Topics Concern  . None   Social History Narrative     Medications: Patient's Medications  New Prescriptions   No medications on file  Previous Medications   ANTISEPTIC ORAL RINSE (BIOTENE) LIQD    15 mLs by Mouth Rinse route 2 (two) times daily.   ASPIRIN 81 MG TABLET    Take 81 mg by mouth daily.   CARBIDOPA-LEVODOPA (SINEMET IR) 25-100 MG PER TABLET    Take 1 tablet by mouth 4 (four) times daily. For muscle spasms   CHOLECALCIFEROL (VITAMIN D) 1000 UNITS TABLET    Take 1,000 Units by mouth daily.   DEXTROMETHORPHAN 15 MG/5ML SYRUP     Take 10 mLs by mouth 4 (four) times daily as needed for cough.   DILTIAZEM (CARDIZEM) 60 MG TABLET    Take 60 mg by mouth daily.   DOCUSATE SODIUM (COLACE) 100 MG CAPSULE    Take 100 mg by mouth 2 (two) times daily.   FLUOXETINE (PROZAC) 20 MG CAPSULE    Take 60 mg by mouth daily. Take 3 tablets to equal 60 mg for anxiety.   FUROSEMIDE (LASIX) 20 MG TABLET    Take 20 mg by mouth daily.   LAMOTRIGINE (LAMICTAL) 150 MG TABLET    Take 150 mg by mouth daily.   MELATONIN 3 MG CAPS    Take 6 mg by mouth at bedtime.   METOPROLOL TARTRATE (LOPRESSOR) 25 MG TABLET    Take 25 mg by mouth daily.    MIRTAZAPINE (REMERON) 15 MG TABLET    Take 15 mg by mouth at bedtime.   MULTIPLE VITAMIN (DAILY VITE) TABS    Take by mouth daily. Take 1 tablet   OXYCODONE (ROXICODONE) 5 MG IMMEDIATE RELEASE TABLET    Take one tablet by mouth every 4 hours as needed  for pain   PROMETHAZINE (PHENERGAN) 25 MG TABLET    25 mg daily.   QUETIAPINE (SEROQUEL) 100 MG TABLET    Take 100 mg by mouth at bedtime. For major depressive disorder   ROPINIROLE (REQUIP) 5 MG TABLET    Take 5 mg by mouth at bedtime. For anxiety   SENNOSIDES-DOCUSATE SODIUM (SENOKOT-S) 8.6-50 MG TABLET    Take 2 tablets by mouth daily.   SUVOREXANT (BELSOMRA) 5 MG TABS    Take 5 mg by mouth at bedtime.  Modified Medications   No medications on file  Discontinued Medications   No medications on file     Review of Systems  Constitutional: Negative for fever, chills, diaphoresis, activity change, appetite change and fatigue.  HENT: Negative for ear pain and sore throat.   Eyes: Negative for visual disturbance.  Respiratory: Negative for cough, chest tightness and shortness of breath.   Cardiovascular: Negative for chest pain, palpitations and leg swelling.  Gastrointestinal: Positive for constipation. Negative for nausea, vomiting, abdominal pain, diarrhea and blood in stool.  Genitourinary: Negative for dysuria.  Musculoskeletal: Positive for arthralgias  and gait problem.  Neurological: Positive for tremors. Negative for dizziness, numbness and headaches.  Psychiatric/Behavioral: Positive for sleep disturbance. The patient is not nervous/anxious.     Filed Vitals:   02/06/15 2207  BP: 115/67  Pulse: 87   There is no weight on file to calculate BMI.  Physical Exam  Constitutional: She is oriented to person, place, and time. She appears well-developed and well-nourished.  HENT:  Mouth/Throat: Oropharynx is clear and moist. No oropharyngeal exudate.  Eyes: Pupils are equal, round, and reactive to light. No scleral icterus.  Neck: Neck supple. No tracheal deviation present.  Cardiovascular: Normal rate, regular rhythm, normal heart sounds and intact distal pulses.  Exam reveals no gallop and no friction rub.   No murmur heard. No LE edema b/l. no calf TTP. No carotid bruit b/l  Pulmonary/Chest: Effort normal and breath sounds normal. No stridor. No respiratory distress. She has no wheezes. She has no rales.  Abdominal: Soft. Bowel sounds are normal. She exhibits no distension and no mass. There is no tenderness. There is no rebound and no guarding.  Lymphadenopathy:    She has no cervical adenopathy.  Neurological: She is alert and oriented to person, place, and time. She has normal reflexes. She displays tremor (resting).  Skin: Skin is warm and dry. No rash noted.  Psychiatric: She has a normal mood and affect. Her behavior is normal. Judgment and thought content normal.     Labs reviewed: Nursing Home on 02/23/2015  Component Date Value Ref Range Status  . Hemoglobin 12/02/2014 9.9* 12.0 - 16.0 g/dL Final  . HCT 12/02/2014 33* 36 - 46 % Final  . Platelets 12/02/2014 428* 150 - 399 K/L Final  . WBC 12/02/2014 10.0   Final   CBC Latest Ref Rng 12/02/2014 04/18/2014 03/03/2011  WBC - 10.0 9.2 -  Hemoglobin 12.0 - 16.0 g/dL 9.9(A) 12.9 12.6  Hematocrit 36 - 46 % 33(A) 40 37.0  Platelets 150 - 399 K/L 428(A) 295 -       Assessment/Plan   ICD-9-CM ICD-10-CM   1. Insomnia - improved on belsomra 780.52 G47.00   2. Essential hypertension, benign - controlled on metoprolol 401.1 I10   3. Parkinson disease stable on sinemet and requip 332.0 G20   4. Paroxysmal atrial fibrillation - rate controlled on metoprolol 427.31 I48.0   5. Edema - stable on lasix  782.3 R60.9   6. Anemia due to other cause - worse compared to Apr 2015 285.8 D64.89   7. Anxiety/depression - stable  --repeat CBC w diff in 1 month to follow Hgb/Hct. May need further testing including colonoscopy  --mood is controlled on remeron, lamictal, prozac and seroquel  --continue nutritional supplement TID  --will follow  Dayanis Bergquist S. Perlie Gold  [facility] [address] [place], [place] [address] [phone] Office (Wednesdays and Fridays 8 AM - 5 PM) [phone] Cell (Monday-Friday 8 AM - 5 PM)

## 2015-02-27 ENCOUNTER — Non-Acute Institutional Stay (SKILLED_NURSING_FACILITY): Payer: Medicare Other | Admitting: Adult Health

## 2015-02-27 DIAGNOSIS — F330 Major depressive disorder, recurrent, mild: Secondary | ICD-10-CM | POA: Diagnosis not present

## 2015-02-27 DIAGNOSIS — I10 Essential (primary) hypertension: Secondary | ICD-10-CM | POA: Diagnosis not present

## 2015-02-27 DIAGNOSIS — I480 Paroxysmal atrial fibrillation: Secondary | ICD-10-CM | POA: Diagnosis not present

## 2015-02-27 DIAGNOSIS — G4700 Insomnia, unspecified: Secondary | ICD-10-CM

## 2015-02-27 DIAGNOSIS — K5900 Constipation, unspecified: Secondary | ICD-10-CM

## 2015-02-27 DIAGNOSIS — G20 Parkinson's disease: Secondary | ICD-10-CM | POA: Diagnosis not present

## 2015-02-27 DIAGNOSIS — D649 Anemia, unspecified: Secondary | ICD-10-CM | POA: Diagnosis not present

## 2015-02-27 DIAGNOSIS — G218 Other secondary parkinsonism: Secondary | ICD-10-CM | POA: Diagnosis not present

## 2015-02-27 DIAGNOSIS — Z79899 Other long term (current) drug therapy: Secondary | ICD-10-CM | POA: Diagnosis not present

## 2015-03-10 DIAGNOSIS — G629 Polyneuropathy, unspecified: Secondary | ICD-10-CM | POA: Diagnosis not present

## 2015-03-10 DIAGNOSIS — M79675 Pain in left toe(s): Secondary | ICD-10-CM | POA: Diagnosis not present

## 2015-03-10 DIAGNOSIS — B351 Tinea unguium: Secondary | ICD-10-CM | POA: Diagnosis not present

## 2015-03-10 DIAGNOSIS — E1159 Type 2 diabetes mellitus with other circulatory complications: Secondary | ICD-10-CM | POA: Diagnosis not present

## 2015-03-10 DIAGNOSIS — M79674 Pain in right toe(s): Secondary | ICD-10-CM | POA: Diagnosis not present

## 2015-03-20 DIAGNOSIS — M1712 Unilateral primary osteoarthritis, left knee: Secondary | ICD-10-CM | POA: Diagnosis not present

## 2015-03-26 DIAGNOSIS — M1712 Unilateral primary osteoarthritis, left knee: Secondary | ICD-10-CM | POA: Diagnosis not present

## 2015-03-26 DIAGNOSIS — M1612 Unilateral primary osteoarthritis, left hip: Secondary | ICD-10-CM | POA: Diagnosis not present

## 2015-03-27 ENCOUNTER — Non-Acute Institutional Stay (SKILLED_NURSING_FACILITY): Payer: Medicare Other | Admitting: Adult Health

## 2015-03-27 DIAGNOSIS — M1712 Unilateral primary osteoarthritis, left knee: Secondary | ICD-10-CM

## 2015-03-28 DIAGNOSIS — F064 Anxiety disorder due to known physiological condition: Secondary | ICD-10-CM | POA: Diagnosis not present

## 2015-03-28 DIAGNOSIS — F39 Unspecified mood [affective] disorder: Secondary | ICD-10-CM | POA: Diagnosis not present

## 2015-03-28 DIAGNOSIS — M1712 Unilateral primary osteoarthritis, left knee: Secondary | ICD-10-CM | POA: Diagnosis not present

## 2015-03-28 DIAGNOSIS — F339 Major depressive disorder, recurrent, unspecified: Secondary | ICD-10-CM | POA: Diagnosis not present

## 2015-03-28 DIAGNOSIS — G218 Other secondary parkinsonism: Secondary | ICD-10-CM | POA: Diagnosis not present

## 2015-03-28 DIAGNOSIS — I4891 Unspecified atrial fibrillation: Secondary | ICD-10-CM | POA: Diagnosis not present

## 2015-03-28 DIAGNOSIS — E559 Vitamin D deficiency, unspecified: Secondary | ICD-10-CM | POA: Diagnosis not present

## 2015-03-28 DIAGNOSIS — I120 Hypertensive chronic kidney disease with stage 5 chronic kidney disease or end stage renal disease: Secondary | ICD-10-CM | POA: Diagnosis not present

## 2015-03-29 DIAGNOSIS — G218 Other secondary parkinsonism: Secondary | ICD-10-CM | POA: Diagnosis not present

## 2015-03-29 DIAGNOSIS — I4891 Unspecified atrial fibrillation: Secondary | ICD-10-CM | POA: Diagnosis not present

## 2015-03-29 DIAGNOSIS — M1712 Unilateral primary osteoarthritis, left knee: Secondary | ICD-10-CM | POA: Diagnosis not present

## 2015-03-29 DIAGNOSIS — F064 Anxiety disorder due to known physiological condition: Secondary | ICD-10-CM | POA: Diagnosis not present

## 2015-03-29 DIAGNOSIS — F39 Unspecified mood [affective] disorder: Secondary | ICD-10-CM | POA: Diagnosis not present

## 2015-03-29 DIAGNOSIS — I120 Hypertensive chronic kidney disease with stage 5 chronic kidney disease or end stage renal disease: Secondary | ICD-10-CM | POA: Diagnosis not present

## 2015-03-29 DIAGNOSIS — E559 Vitamin D deficiency, unspecified: Secondary | ICD-10-CM | POA: Diagnosis not present

## 2015-03-29 DIAGNOSIS — F339 Major depressive disorder, recurrent, unspecified: Secondary | ICD-10-CM | POA: Diagnosis not present

## 2015-03-31 DIAGNOSIS — F339 Major depressive disorder, recurrent, unspecified: Secondary | ICD-10-CM | POA: Diagnosis not present

## 2015-03-31 DIAGNOSIS — G218 Other secondary parkinsonism: Secondary | ICD-10-CM | POA: Diagnosis not present

## 2015-03-31 DIAGNOSIS — M1712 Unilateral primary osteoarthritis, left knee: Secondary | ICD-10-CM | POA: Diagnosis not present

## 2015-03-31 DIAGNOSIS — F39 Unspecified mood [affective] disorder: Secondary | ICD-10-CM | POA: Diagnosis not present

## 2015-03-31 DIAGNOSIS — E559 Vitamin D deficiency, unspecified: Secondary | ICD-10-CM | POA: Diagnosis not present

## 2015-03-31 DIAGNOSIS — F064 Anxiety disorder due to known physiological condition: Secondary | ICD-10-CM | POA: Diagnosis not present

## 2015-03-31 DIAGNOSIS — I120 Hypertensive chronic kidney disease with stage 5 chronic kidney disease or end stage renal disease: Secondary | ICD-10-CM | POA: Diagnosis not present

## 2015-03-31 DIAGNOSIS — I4891 Unspecified atrial fibrillation: Secondary | ICD-10-CM | POA: Diagnosis not present

## 2015-04-01 ENCOUNTER — Non-Acute Institutional Stay (SKILLED_NURSING_FACILITY): Payer: Medicare Other | Admitting: Adult Health

## 2015-04-01 DIAGNOSIS — G218 Other secondary parkinsonism: Secondary | ICD-10-CM | POA: Diagnosis not present

## 2015-04-01 DIAGNOSIS — M1712 Unilateral primary osteoarthritis, left knee: Secondary | ICD-10-CM

## 2015-04-01 DIAGNOSIS — G250 Essential tremor: Secondary | ICD-10-CM | POA: Diagnosis not present

## 2015-04-01 DIAGNOSIS — I10 Essential (primary) hypertension: Secondary | ICD-10-CM | POA: Diagnosis not present

## 2015-04-01 DIAGNOSIS — F39 Unspecified mood [affective] disorder: Secondary | ICD-10-CM | POA: Diagnosis not present

## 2015-04-01 DIAGNOSIS — F064 Anxiety disorder due to known physiological condition: Secondary | ICD-10-CM | POA: Diagnosis not present

## 2015-04-01 DIAGNOSIS — F330 Major depressive disorder, recurrent, mild: Secondary | ICD-10-CM

## 2015-04-01 DIAGNOSIS — G20 Parkinson's disease: Secondary | ICD-10-CM | POA: Diagnosis not present

## 2015-04-01 DIAGNOSIS — E559 Vitamin D deficiency, unspecified: Secondary | ICD-10-CM | POA: Diagnosis not present

## 2015-04-01 DIAGNOSIS — I120 Hypertensive chronic kidney disease with stage 5 chronic kidney disease or end stage renal disease: Secondary | ICD-10-CM | POA: Diagnosis not present

## 2015-04-01 DIAGNOSIS — I480 Paroxysmal atrial fibrillation: Secondary | ICD-10-CM | POA: Diagnosis not present

## 2015-04-01 DIAGNOSIS — F339 Major depressive disorder, recurrent, unspecified: Secondary | ICD-10-CM | POA: Diagnosis not present

## 2015-04-01 DIAGNOSIS — K5900 Constipation, unspecified: Secondary | ICD-10-CM | POA: Diagnosis not present

## 2015-04-01 DIAGNOSIS — I4891 Unspecified atrial fibrillation: Secondary | ICD-10-CM | POA: Diagnosis not present

## 2015-04-02 DIAGNOSIS — I4891 Unspecified atrial fibrillation: Secondary | ICD-10-CM | POA: Diagnosis not present

## 2015-04-02 DIAGNOSIS — G218 Other secondary parkinsonism: Secondary | ICD-10-CM | POA: Diagnosis not present

## 2015-04-02 DIAGNOSIS — F339 Major depressive disorder, recurrent, unspecified: Secondary | ICD-10-CM | POA: Diagnosis not present

## 2015-04-02 DIAGNOSIS — E559 Vitamin D deficiency, unspecified: Secondary | ICD-10-CM | POA: Diagnosis not present

## 2015-04-02 DIAGNOSIS — F064 Anxiety disorder due to known physiological condition: Secondary | ICD-10-CM | POA: Diagnosis not present

## 2015-04-02 DIAGNOSIS — I120 Hypertensive chronic kidney disease with stage 5 chronic kidney disease or end stage renal disease: Secondary | ICD-10-CM | POA: Diagnosis not present

## 2015-04-02 DIAGNOSIS — M1712 Unilateral primary osteoarthritis, left knee: Secondary | ICD-10-CM | POA: Diagnosis not present

## 2015-04-02 DIAGNOSIS — F39 Unspecified mood [affective] disorder: Secondary | ICD-10-CM | POA: Diagnosis not present

## 2015-04-03 DIAGNOSIS — I120 Hypertensive chronic kidney disease with stage 5 chronic kidney disease or end stage renal disease: Secondary | ICD-10-CM | POA: Diagnosis not present

## 2015-04-03 DIAGNOSIS — G218 Other secondary parkinsonism: Secondary | ICD-10-CM | POA: Diagnosis not present

## 2015-04-03 DIAGNOSIS — F39 Unspecified mood [affective] disorder: Secondary | ICD-10-CM | POA: Diagnosis not present

## 2015-04-03 DIAGNOSIS — M1712 Unilateral primary osteoarthritis, left knee: Secondary | ICD-10-CM | POA: Diagnosis not present

## 2015-04-03 DIAGNOSIS — F064 Anxiety disorder due to known physiological condition: Secondary | ICD-10-CM | POA: Diagnosis not present

## 2015-04-03 DIAGNOSIS — I4891 Unspecified atrial fibrillation: Secondary | ICD-10-CM | POA: Diagnosis not present

## 2015-04-03 DIAGNOSIS — E559 Vitamin D deficiency, unspecified: Secondary | ICD-10-CM | POA: Diagnosis not present

## 2015-04-03 DIAGNOSIS — F339 Major depressive disorder, recurrent, unspecified: Secondary | ICD-10-CM | POA: Diagnosis not present

## 2015-04-04 DIAGNOSIS — E559 Vitamin D deficiency, unspecified: Secondary | ICD-10-CM | POA: Diagnosis not present

## 2015-04-04 DIAGNOSIS — F064 Anxiety disorder due to known physiological condition: Secondary | ICD-10-CM | POA: Diagnosis not present

## 2015-04-04 DIAGNOSIS — I120 Hypertensive chronic kidney disease with stage 5 chronic kidney disease or end stage renal disease: Secondary | ICD-10-CM | POA: Diagnosis not present

## 2015-04-04 DIAGNOSIS — F339 Major depressive disorder, recurrent, unspecified: Secondary | ICD-10-CM | POA: Diagnosis not present

## 2015-04-04 DIAGNOSIS — F39 Unspecified mood [affective] disorder: Secondary | ICD-10-CM | POA: Diagnosis not present

## 2015-04-04 DIAGNOSIS — G218 Other secondary parkinsonism: Secondary | ICD-10-CM | POA: Diagnosis not present

## 2015-04-04 DIAGNOSIS — M1712 Unilateral primary osteoarthritis, left knee: Secondary | ICD-10-CM | POA: Diagnosis not present

## 2015-04-04 DIAGNOSIS — I4891 Unspecified atrial fibrillation: Secondary | ICD-10-CM | POA: Diagnosis not present

## 2015-04-07 DIAGNOSIS — G218 Other secondary parkinsonism: Secondary | ICD-10-CM | POA: Diagnosis not present

## 2015-04-07 DIAGNOSIS — I120 Hypertensive chronic kidney disease with stage 5 chronic kidney disease or end stage renal disease: Secondary | ICD-10-CM | POA: Diagnosis not present

## 2015-04-07 DIAGNOSIS — F339 Major depressive disorder, recurrent, unspecified: Secondary | ICD-10-CM | POA: Diagnosis not present

## 2015-04-07 DIAGNOSIS — M1712 Unilateral primary osteoarthritis, left knee: Secondary | ICD-10-CM | POA: Diagnosis not present

## 2015-04-07 DIAGNOSIS — I4891 Unspecified atrial fibrillation: Secondary | ICD-10-CM | POA: Diagnosis not present

## 2015-04-07 DIAGNOSIS — E559 Vitamin D deficiency, unspecified: Secondary | ICD-10-CM | POA: Diagnosis not present

## 2015-04-07 DIAGNOSIS — F064 Anxiety disorder due to known physiological condition: Secondary | ICD-10-CM | POA: Diagnosis not present

## 2015-04-07 DIAGNOSIS — F39 Unspecified mood [affective] disorder: Secondary | ICD-10-CM | POA: Diagnosis not present

## 2015-04-09 DIAGNOSIS — F39 Unspecified mood [affective] disorder: Secondary | ICD-10-CM | POA: Diagnosis not present

## 2015-04-09 DIAGNOSIS — I4891 Unspecified atrial fibrillation: Secondary | ICD-10-CM | POA: Diagnosis not present

## 2015-04-09 DIAGNOSIS — M1712 Unilateral primary osteoarthritis, left knee: Secondary | ICD-10-CM | POA: Diagnosis not present

## 2015-04-09 DIAGNOSIS — G218 Other secondary parkinsonism: Secondary | ICD-10-CM | POA: Diagnosis not present

## 2015-04-09 DIAGNOSIS — F064 Anxiety disorder due to known physiological condition: Secondary | ICD-10-CM | POA: Diagnosis not present

## 2015-04-09 DIAGNOSIS — I120 Hypertensive chronic kidney disease with stage 5 chronic kidney disease or end stage renal disease: Secondary | ICD-10-CM | POA: Diagnosis not present

## 2015-04-09 DIAGNOSIS — E559 Vitamin D deficiency, unspecified: Secondary | ICD-10-CM | POA: Diagnosis not present

## 2015-04-09 DIAGNOSIS — F339 Major depressive disorder, recurrent, unspecified: Secondary | ICD-10-CM | POA: Diagnosis not present

## 2015-04-10 ENCOUNTER — Encounter: Payer: Self-pay | Admitting: Adult Health

## 2015-04-10 NOTE — Progress Notes (Signed)
Patient ID: [name] [name], female   DOB: 09/02/1936, 79 y.o.   MRN: [id number]  [facility]     Allergies  Allergen Reactions  . Penicillins        Chief Complaint  Patient presents with  . Medical Management of Chronic Issues    HPI:  She is a long term resident of this facility being seen for the management of her chronic illnesses. She has cough with crackles and wheezes throughout. The nursing staff is concerned that she is developing pneumonia. There are no reports of fever present.    Past Medical History  Diagnosis Date  . Vitamin D deficiency   . Edema   . Anemia   . Hypokalemia   . Depression   . Anxiety   . Parkinson disease   . Chronic pain   . Hypertension   . A-fib   . Constipation   . Chronic kidney disease   . UTI (lower urinary tract infection)   . Insomnia   . Intertrochanteric fracture   . Gait disturbance     Past Surgical History  Procedure Laterality Date  . Right hip surgery due to fracture      VITAL SIGNS BP 129/74 mmHg  Pulse 74  Ht 5\' 2"  (1.575 m)  Wt 131 lb (59.421 kg)  BMI 23.95 kg/m2   Outpatient Encounter Prescriptions as of 02/27/2015  Medication Sig  . antiseptic oral rinse (BIOTENE) LIQD 15 mLs by Mouth Rinse route 2 (two) times daily.  Marland Kitchen aspirin 81 MG tablet Take 81 mg by mouth daily.  . carbidopa-levodopa (SINEMET IR) 25-100 MG per tablet Take 1 tablet by mouth 4 (four) times daily. For muscle spasms  . cholecalciferol (VITAMIN D) 1000 UNITS tablet Take 1,000 Units by mouth daily.  Marland Kitchen dextromethorphan 15 MG/5ML syrup Take 10 mLs by mouth 4 (four) times daily as needed for cough.  . diltiazem (CARDIZEM) 60 MG tablet Take 60 mg by mouth daily.  Marland Kitchen docusate sodium (COLACE) 100 MG capsule Take 100 mg by mouth 2 (two) times daily.  Marland Kitchen FLUoxetine (PROZAC) 20 MG capsule Take 60 mg by mouth daily. Take 3 tablets to equal 60 mg for anxiety.  . furosemide (LASIX) 20 MG tablet Take 20 mg by mouth daily.  Marland Kitchen lamoTRIgine (LAMICTAL) 150  MG tablet Take 150 mg by mouth daily.  . Melatonin 3 MG CAPS Take 6 mg by mouth at bedtime.  . metoprolol tartrate (LOPRESSOR) 25 MG tablet Take 25 mg by mouth daily.   . mirtazapine (REMERON) 15 MG tablet Take 15 mg by mouth at bedtime.  . Multiple Vitamin (DAILY VITE) TABS Take by mouth daily. Take 1 tablet  . oxyCODONE (ROXICODONE) 5 MG immediate release tablet Take one tablet by mouth every 4 hours as needed for pain  . promethazine (PHENERGAN) 25 MG tablet 25 mg daily.  . QUEtiapine (SEROQUEL) 100 MG tablet Take 100 mg by mouth at bedtime. For major depressive disorder  . ropinirole (REQUIP) 5 MG tablet Take 5 mg by mouth at bedtime. For anxiety  . sennosides-docusate sodium (SENOKOT-S) 8.6-50 MG tablet Take 2 tablets by mouth daily.  . Suvorexant (BELSOMRA) 5 MG TABS Take 5 mg by mouth at bedtime.     SIGNIFICANT DIAGNOSTIC EXAMS    LABS REVIEWED:   11-19-14: wbc 10.0; hgb 10.4; hct 36.0; mcv 88.7; plt 423 glucose 87; bun 20.9; creat 0.87 ;k+5.0; na++141; liver normal albumin 3.6 vit d 19.27  12-02-14: wbc 10.0; hgb 9.9; hct 32.7; mcv  87.4 ;plt 428    ROS Constitutional: Negative for malaise/fatigue.  Respiratory: has cough present   Cardiovascular: Negative for chest pain and leg swelling.  Gastrointestinal: Negative for heartburn, abdominal pain and constipation.  Musculoskeletal: Negative for myalgias and joint pain.  Skin: Negative.   Neurological: Negative for headaches.  Psychiatric/Behavioral: Positive for depression.    Physical Exam Constitutional: No distress.  Thin   Neck: Neck supple. No JVD present. No thyromegaly present.  Cardiovascular: Normal rate and intact distal pulses.   Heart rate irregular   Respiratory: crackles and wheezes throughout  GI: Soft. She exhibits no distension.  Musculoskeletal: She exhibits no edema.  Is able to move all extremities Has bilateral resting tremors present   Neurological: She is alert.  Skin: Skin is warm and dry.  She is not diaphoretic.      ASSESSMENT/ PLAN:   1. Parkinson disease: no change in status will continue sinemet 25/100 mg four times daily and requip 5 mg daily and will monitor   2. Afib: heart rate is stable will continue lopressor 25 mg daily and cardizem 60 mg daily for rate control and asa 81 mg daily   3. Hypertension: is stable will continue lopressor 25 mg daily and cardizem 50 mg daily   4. Constipation: will continue senna s 2 tabs daily and colace twice daily   5. Edema: is stable will continue las ix 20 mg daily  6. Major depression: she is followed by [facility]; will continue prozac 60 mg daily; lamictal 150 mg daily to help stabilize mood;  And seroquel 100 mg nightly will monitor  7. Insomnia: is significant: will continue melatonin 6 mg nightly; remeron 15 mg nightly and belsomra 5 mg nightly and will monitor   8. Pneumonia: will begin avelox 400 mg daily for 14 days with florastor twice daily for 14 days and will monitor her status.      Ok Edwards NP [facility]  Contact [phone] Monday through Friday 8am- 5pm  After hours call [phone]

## 2015-04-11 DIAGNOSIS — E559 Vitamin D deficiency, unspecified: Secondary | ICD-10-CM | POA: Diagnosis not present

## 2015-04-11 DIAGNOSIS — M1712 Unilateral primary osteoarthritis, left knee: Secondary | ICD-10-CM | POA: Diagnosis not present

## 2015-04-11 DIAGNOSIS — F064 Anxiety disorder due to known physiological condition: Secondary | ICD-10-CM | POA: Diagnosis not present

## 2015-04-11 DIAGNOSIS — I120 Hypertensive chronic kidney disease with stage 5 chronic kidney disease or end stage renal disease: Secondary | ICD-10-CM | POA: Diagnosis not present

## 2015-04-11 DIAGNOSIS — I4891 Unspecified atrial fibrillation: Secondary | ICD-10-CM | POA: Diagnosis not present

## 2015-04-11 DIAGNOSIS — G218 Other secondary parkinsonism: Secondary | ICD-10-CM | POA: Diagnosis not present

## 2015-04-11 DIAGNOSIS — F339 Major depressive disorder, recurrent, unspecified: Secondary | ICD-10-CM | POA: Diagnosis not present

## 2015-04-11 DIAGNOSIS — F39 Unspecified mood [affective] disorder: Secondary | ICD-10-CM | POA: Diagnosis not present

## 2015-04-14 DIAGNOSIS — I120 Hypertensive chronic kidney disease with stage 5 chronic kidney disease or end stage renal disease: Secondary | ICD-10-CM | POA: Diagnosis not present

## 2015-04-14 DIAGNOSIS — G218 Other secondary parkinsonism: Secondary | ICD-10-CM | POA: Diagnosis not present

## 2015-04-14 DIAGNOSIS — F39 Unspecified mood [affective] disorder: Secondary | ICD-10-CM | POA: Diagnosis not present

## 2015-04-14 DIAGNOSIS — F064 Anxiety disorder due to known physiological condition: Secondary | ICD-10-CM | POA: Diagnosis not present

## 2015-04-14 DIAGNOSIS — E559 Vitamin D deficiency, unspecified: Secondary | ICD-10-CM | POA: Diagnosis not present

## 2015-04-14 DIAGNOSIS — F339 Major depressive disorder, recurrent, unspecified: Secondary | ICD-10-CM | POA: Diagnosis not present

## 2015-04-14 DIAGNOSIS — M1712 Unilateral primary osteoarthritis, left knee: Secondary | ICD-10-CM | POA: Diagnosis not present

## 2015-04-14 DIAGNOSIS — I4891 Unspecified atrial fibrillation: Secondary | ICD-10-CM | POA: Diagnosis not present

## 2015-04-15 DIAGNOSIS — Z961 Presence of intraocular lens: Secondary | ICD-10-CM | POA: Diagnosis not present

## 2015-04-15 DIAGNOSIS — H02839 Dermatochalasis of unspecified eye, unspecified eyelid: Secondary | ICD-10-CM | POA: Diagnosis not present

## 2015-04-15 DIAGNOSIS — H04123 Dry eye syndrome of bilateral lacrimal glands: Secondary | ICD-10-CM | POA: Diagnosis not present

## 2015-04-16 DIAGNOSIS — I120 Hypertensive chronic kidney disease with stage 5 chronic kidney disease or end stage renal disease: Secondary | ICD-10-CM | POA: Diagnosis not present

## 2015-04-16 DIAGNOSIS — G218 Other secondary parkinsonism: Secondary | ICD-10-CM | POA: Diagnosis not present

## 2015-04-16 DIAGNOSIS — F39 Unspecified mood [affective] disorder: Secondary | ICD-10-CM | POA: Diagnosis not present

## 2015-04-16 DIAGNOSIS — M1712 Unilateral primary osteoarthritis, left knee: Secondary | ICD-10-CM | POA: Diagnosis not present

## 2015-04-16 DIAGNOSIS — E559 Vitamin D deficiency, unspecified: Secondary | ICD-10-CM | POA: Diagnosis not present

## 2015-04-16 DIAGNOSIS — I4891 Unspecified atrial fibrillation: Secondary | ICD-10-CM | POA: Diagnosis not present

## 2015-04-16 DIAGNOSIS — F339 Major depressive disorder, recurrent, unspecified: Secondary | ICD-10-CM | POA: Diagnosis not present

## 2015-04-16 DIAGNOSIS — F064 Anxiety disorder due to known physiological condition: Secondary | ICD-10-CM | POA: Diagnosis not present

## 2015-04-18 DIAGNOSIS — F064 Anxiety disorder due to known physiological condition: Secondary | ICD-10-CM | POA: Diagnosis not present

## 2015-04-18 DIAGNOSIS — F39 Unspecified mood [affective] disorder: Secondary | ICD-10-CM | POA: Diagnosis not present

## 2015-04-18 DIAGNOSIS — I120 Hypertensive chronic kidney disease with stage 5 chronic kidney disease or end stage renal disease: Secondary | ICD-10-CM | POA: Diagnosis not present

## 2015-04-18 DIAGNOSIS — I4891 Unspecified atrial fibrillation: Secondary | ICD-10-CM | POA: Diagnosis not present

## 2015-04-18 DIAGNOSIS — E559 Vitamin D deficiency, unspecified: Secondary | ICD-10-CM | POA: Diagnosis not present

## 2015-04-18 DIAGNOSIS — G218 Other secondary parkinsonism: Secondary | ICD-10-CM | POA: Diagnosis not present

## 2015-04-18 DIAGNOSIS — M1712 Unilateral primary osteoarthritis, left knee: Secondary | ICD-10-CM | POA: Diagnosis not present

## 2015-04-18 DIAGNOSIS — F339 Major depressive disorder, recurrent, unspecified: Secondary | ICD-10-CM | POA: Diagnosis not present

## 2015-04-23 DIAGNOSIS — F064 Anxiety disorder due to known physiological condition: Secondary | ICD-10-CM | POA: Diagnosis not present

## 2015-04-23 DIAGNOSIS — F339 Major depressive disorder, recurrent, unspecified: Secondary | ICD-10-CM | POA: Diagnosis not present

## 2015-04-23 DIAGNOSIS — I120 Hypertensive chronic kidney disease with stage 5 chronic kidney disease or end stage renal disease: Secondary | ICD-10-CM | POA: Diagnosis not present

## 2015-04-23 DIAGNOSIS — M1712 Unilateral primary osteoarthritis, left knee: Secondary | ICD-10-CM | POA: Diagnosis not present

## 2015-04-23 DIAGNOSIS — G218 Other secondary parkinsonism: Secondary | ICD-10-CM | POA: Diagnosis not present

## 2015-04-23 DIAGNOSIS — I4891 Unspecified atrial fibrillation: Secondary | ICD-10-CM | POA: Diagnosis not present

## 2015-04-23 DIAGNOSIS — E559 Vitamin D deficiency, unspecified: Secondary | ICD-10-CM | POA: Diagnosis not present

## 2015-04-23 DIAGNOSIS — F39 Unspecified mood [affective] disorder: Secondary | ICD-10-CM | POA: Diagnosis not present

## 2015-04-24 DIAGNOSIS — F339 Major depressive disorder, recurrent, unspecified: Secondary | ICD-10-CM | POA: Diagnosis not present

## 2015-04-24 DIAGNOSIS — G218 Other secondary parkinsonism: Secondary | ICD-10-CM | POA: Diagnosis not present

## 2015-04-24 DIAGNOSIS — E559 Vitamin D deficiency, unspecified: Secondary | ICD-10-CM | POA: Diagnosis not present

## 2015-04-24 DIAGNOSIS — I4891 Unspecified atrial fibrillation: Secondary | ICD-10-CM | POA: Diagnosis not present

## 2015-04-24 DIAGNOSIS — M1712 Unilateral primary osteoarthritis, left knee: Secondary | ICD-10-CM | POA: Diagnosis not present

## 2015-04-24 DIAGNOSIS — I120 Hypertensive chronic kidney disease with stage 5 chronic kidney disease or end stage renal disease: Secondary | ICD-10-CM | POA: Diagnosis not present

## 2015-04-24 DIAGNOSIS — F39 Unspecified mood [affective] disorder: Secondary | ICD-10-CM | POA: Diagnosis not present

## 2015-04-24 DIAGNOSIS — F064 Anxiety disorder due to known physiological condition: Secondary | ICD-10-CM | POA: Diagnosis not present

## 2015-04-25 ENCOUNTER — Encounter: Payer: Self-pay | Admitting: *Deleted

## 2015-05-01 ENCOUNTER — Non-Acute Institutional Stay (SKILLED_NURSING_FACILITY): Payer: Medicare Other | Admitting: Adult Health

## 2015-05-01 DIAGNOSIS — G20 Parkinson's disease: Secondary | ICD-10-CM | POA: Diagnosis not present

## 2015-05-01 DIAGNOSIS — R609 Edema, unspecified: Secondary | ICD-10-CM | POA: Diagnosis not present

## 2015-05-01 DIAGNOSIS — G4700 Insomnia, unspecified: Secondary | ICD-10-CM | POA: Diagnosis not present

## 2015-05-01 DIAGNOSIS — K5900 Constipation, unspecified: Secondary | ICD-10-CM | POA: Diagnosis not present

## 2015-05-01 DIAGNOSIS — I10 Essential (primary) hypertension: Secondary | ICD-10-CM

## 2015-05-01 DIAGNOSIS — F330 Major depressive disorder, recurrent, mild: Secondary | ICD-10-CM | POA: Diagnosis not present

## 2015-05-01 DIAGNOSIS — I480 Paroxysmal atrial fibrillation: Secondary | ICD-10-CM

## 2015-05-01 DIAGNOSIS — M1712 Unilateral primary osteoarthritis, left knee: Secondary | ICD-10-CM | POA: Diagnosis not present

## 2015-05-02 DIAGNOSIS — E119 Type 2 diabetes mellitus without complications: Secondary | ICD-10-CM | POA: Diagnosis not present

## 2015-05-02 DIAGNOSIS — R6889 Other general symptoms and signs: Secondary | ICD-10-CM | POA: Diagnosis not present

## 2015-05-12 ENCOUNTER — Other Ambulatory Visit: Payer: Self-pay | Admitting: *Deleted

## 2015-05-12 MED ORDER — OXYCODONE HCL 5 MG PO TABS: ORAL_TABLET | ORAL | Status: DC

## 2015-05-12 NOTE — Telephone Encounter (Signed)
[id number] 

## 2015-05-16 DIAGNOSIS — N390 Urinary tract infection, site not specified: Secondary | ICD-10-CM | POA: Diagnosis not present

## 2015-05-16 DIAGNOSIS — R319 Hematuria, unspecified: Secondary | ICD-10-CM | POA: Diagnosis not present

## 2015-05-22 DIAGNOSIS — M1712 Unilateral primary osteoarthritis, left knee: Secondary | ICD-10-CM | POA: Insufficient documentation

## 2015-05-22 NOTE — Progress Notes (Signed)
Patient ID: [name] [name], female   DOB: May 17, 1936, 79 y.o.   MRN: [id number]  [facility]     Allergies  Allergen Reactions  . Penicillins        Chief Complaint  Patient presents with  . Annual Exam    HPI:  She is a long term resident of this facility being seen for her annual exam. She has remained stable over this past year without being hospitalized. She has been treated for pneumonia in March of this year with resolution. Her knee pain is improving and is being seen by therapy as directed. There are no nursing concerns at this time.    Past Medical History  Diagnosis Date  . Vitamin D deficiency   . Edema   . Anemia   . Hypokalemia   . Depression   . Anxiety   . Parkinson disease   . Chronic pain   . Hypertension   . A-fib   . Constipation   . Chronic kidney disease   . UTI (lower urinary tract infection)   . Insomnia   . Intertrochanteric fracture   . Gait disturbance     Past Surgical History  Procedure Laterality Date  . Right hip surgery due to fracture     Family History  Problem Relation Age of Onset  . Cancer Mother   . Cancer Father     History   Social History  . Marital Status: Married    Spouse Name: N/A  . Number of Children: N/A  . Years of Education: N/A   Occupational History  . Not on file.   Social History Main Topics  . Smoking status: Never Smoker   . Smokeless tobacco: Never Used  . Alcohol Use: No  . Drug Use: No  . Sexual Activity: Not on file   Other Topics Concern  . Not on file   Social History Narrative    VITAL SIGNS BP 126/66 mmHg  Pulse 86  Ht 5\' 2"  (1.575 m)  Wt 128 lb (58.06 kg)  BMI 23.41 kg/m2   Outpatient Encounter Prescriptions as of 04/01/2015  Medication Sig  . antiseptic oral rinse (BIOTENE) LIQD 15 mLs by Mouth Rinse route 2 (two) times daily.  Marland Kitchen aspirin 81 MG tablet Take 81 mg by mouth daily.  . carbidopa-levodopa (SINEMET IR) 25-100 MG per tablet Take 1 tablet by mouth 4 (four) times  daily. For muscle spasms  . cholecalciferol (VITAMIN D) 1000 UNITS tablet Take 1,000 Units by mouth daily.  Marland Kitchen dextromethorphan 15 MG/5ML syrup Take 10 mLs by mouth 4 (four) times daily as needed for cough.  . diltiazem (CARDIZEM) 60 MG tablet Take 60 mg by mouth daily.  Marland Kitchen docusate sodium (COLACE) 100 MG capsule Take 100 mg by mouth 2 (two) times daily.  Marland Kitchen FLUoxetine (PROZAC) 20 MG capsule Take 60 mg by mouth daily. Take 3 tablets to equal 60 mg for anxiety.  . furosemide (LASIX) 20 MG tablet Take 20 mg by mouth daily.  Marland Kitchen lamoTRIgine (LAMICTAL) 150 MG tablet Take 150 mg by mouth daily.  . Melatonin 3 MG CAPS Take 6 mg by mouth at bedtime.  . metoprolol tartrate (LOPRESSOR) 25 MG tablet Take 25 mg by mouth daily.   . mirtazapine (REMERON) 15 MG tablet Take 15 mg by mouth at bedtime.  . Multiple Vitamin (DAILY VITE) TABS Take by mouth daily. Take 1 tablet  . promethazine (PHENERGAN) 25 MG tablet 25 mg daily.  . QUEtiapine (SEROQUEL) 100 MG tablet  Take 100 mg by mouth at bedtime. For major depressive disorder  . ropinirole (REQUIP) 5 MG tablet Take 5 mg by mouth at bedtime. For anxiety  . sennosides-docusate sodium (SENOKOT-S) 8.6-50 MG tablet Take 2 tablets by mouth daily.   voltaren gel 2 gm to left knee four times daily   . Suvorexant (BELSOMRA) 5 MG TABS Take 10 mg by mouth at bedtime.      SIGNIFICANT DIAGNOSTIC EXAMS: has declined mammogram; dexa;   03-26-15: left knee x-ray: modest osteoarthritis  03-26-15: left hip with pelvis x-ray; modest osteoarthritis left hip     LABS REVIEWED:   11-19-14: wbc 10.0; hgb 10.4; hct 36.0; mcv 88.7; plt 423 glucose 87; bun 20.9; creat 0.87 ;k+5.0; na++141; liver normal albumin 3.6 vit d 19.27  12-02-14: wbc 10.0; hgb 9.9; hct 32.7; mcv 87.4 ;plt 428 02-27-15: wbc 7.2; hgb 10.7; hct 34.5; mcv 90; plt 285 glucose 91; bun 18.9; creat 0.92; k+4.4; na++141; liver normal albumin 3.4      ROS Constitutional: Negative for malaise/fatigue.    Respiratory: no cough no shortness of breath  Cardiovascular: Negative for chest pain and leg swelling.  Gastrointestinal: Negative for heartburn, abdominal pain and constipation.  Musculoskeletal: Negative for myalgias  Has left knee pain is improving .  Skin: Negative.   Neurological: Negative for headaches.  Psychiatric/Behavioral: Positive for depression.     Physical Exam Constitutional: No distress.  Thin   Neck: Neck supple. No JVD present. No thyromegaly present.  Breast exam negative  Cardiovascular: Normal rate and intact distal pulses.   Heart rate irregular   Respiratory: lungs clear; no respiratory distress  GI: Soft. She exhibits no distension.  Musculoskeletal: She exhibits no edema.  Is able to move all extremities Left knee has pain with movement no signs of inflammation present Has bilateral resting tremors present   Neurological: She is alert.  Skin: Skin is warm and dry. She is not diaphoretic.       ASSESSMENT/ PLAN:   1. Parkinson disease: no change in status will continue sinemet 25/100 mg four times daily and requip 5 mg daily and will monitor   2. Afib: heart rate is stable will continue lopressor 25 mg daily and cardizem 60 mg daily for rate control and asa 81 mg daily   3. Hypertension: is stable will continue lopressor 25 mg daily and cardizem 50 mg daily   4. Constipation: will continue senna s 2 tabs daily and colace twice daily   5. Edema: is stable will continue las ix 20 mg daily  6. Major depression: she is followed by [facility]; will continue prozac 60 mg daily; lamictal 150 mg daily to help stabilize mood;  And seroquel 100 mg nightly will monitor  7. Insomnia: is significant: will continue melatonin 6 mg nightly; remeron 15 mg nightly and belsomra 10 mg nightly and will monitor   8. Left knee osteoarthritis: will continue voltaren gel 4 gm to left knee 4 times daily and will monitor    Her health maintenance is up to date.     Ok Edwards NP [facility]  Contact [phone] Monday through Friday 8am- 5pm  After hours call [phone]

## 2015-05-22 NOTE — Progress Notes (Signed)
Patient ID: [name] [name], female   DOB: 03/16/1936, 79 y.o.   MRN: [id number]  [facility]     Allergies  Allergen Reactions  . Penicillins        Chief Complaint  Patient presents with  . Acute Visit    left knee pain     HPI:  She is having left knee pain her x-ray demonstrates modest osteoarthritis. She states that he left knee hurts and makes too difficult for her move her knee. We discussed her pain relief and she is willing to have therapy help her with pain management.    Past Medical History  Diagnosis Date  . Vitamin D deficiency   . Edema   . Anemia   . Hypokalemia   . Depression   . Anxiety   . Parkinson disease   . Chronic pain   . Hypertension   . A-fib   . Constipation   . Chronic kidney disease   . UTI (lower urinary tract infection)   . Insomnia   . Intertrochanteric fracture   . Gait disturbance     Past Surgical History  Procedure Laterality Date  . Right hip surgery due to fracture      VITAL SIGNS BP 117/68 mmHg  Pulse 86  Ht 5\' 2"  (1.575 m)  Wt 131 lb (59.421 kg)  BMI 23.95 kg/m2   Outpatient Encounter Prescriptions as of 03/27/2015  Medication Sig  . antiseptic oral rinse (BIOTENE) LIQD 15 mLs by Mouth Rinse route 2 (two) times daily.  Marland Kitchen aspirin 81 MG tablet Take 81 mg by mouth daily.  . carbidopa-levodopa (SINEMET IR) 25-100 MG per tablet Take 1 tablet by mouth 4 (four) times daily. For muscle spasms  . cholecalciferol (VITAMIN D) 1000 UNITS tablet Take 1,000 Units by mouth daily.  Marland Kitchen dextromethorphan 15 MG/5ML syrup Take 10 mLs by mouth 4 (four) times daily as needed for cough.  . diltiazem (CARDIZEM) 60 MG tablet Take 60 mg by mouth daily.  Marland Kitchen docusate sodium (COLACE) 100 MG capsule Take 100 mg by mouth 2 (two) times daily.  Marland Kitchen FLUoxetine (PROZAC) 20 MG capsule Take 60 mg by mouth daily. Take 3 tablets to equal 60 mg for anxiety.  . furosemide (LASIX) 20 MG tablet Take 20 mg by mouth daily.  Marland Kitchen lamoTRIgine (LAMICTAL) 150 MG tablet  Take 150 mg by mouth daily.  . Melatonin 3 MG CAPS Take 6 mg by mouth at bedtime.  . metoprolol tartrate (LOPRESSOR) 25 MG tablet Take 25 mg by mouth daily.   . mirtazapine (REMERON) 15 MG tablet Take 15 mg by mouth at bedtime.  . Multiple Vitamin (DAILY VITE) TABS Take by mouth daily. Take 1 tablet  . promethazine (PHENERGAN) 25 MG tablet 25 mg daily.  . QUEtiapine (SEROQUEL) 100 MG tablet Take 100 mg by mouth at bedtime. For major depressive disorder  . ropinirole (REQUIP) 5 MG tablet Take 5 mg by mouth at bedtime. For anxiety  . sennosides-docusate sodium (SENOKOT-S) 8.6-50 MG tablet Take 2 tablets by mouth daily.      SIGNIFICANT DIAGNOSTIC EXAMS  03-26-15: left knee x-ray: modest osteoarthritis  03-26-15: left hip with pelvis x-ray; modest osteoarthritis left hip     LABS REVIEWED:   11-19-14: wbc 10.0; hgb 10.4; hct 36.0; mcv 88.7; plt 423 glucose 87; bun 20.9; creat 0.87 ;k+5.0; na++141; liver normal albumin 3.6 vit d 19.27  12-02-14: wbc 10.0; hgb 9.9; hct 32.7; mcv 87.4 ;plt 428 02-27-15: wbc 7.2; hgb 10.7; hct 34.5;  mcv 90; plt 285 glucose 91; bun 18.9; creat 0.92; k+4.4; na++141; liver normal albumin 3.4      ROS Constitutional: Negative for malaise/fatigue.  Respiratory: no cough no shortness of breath  Cardiovascular: Negative for chest pain and leg swelling.  Gastrointestinal: Negative for heartburn, abdominal pain and constipation.  Musculoskeletal: Negative for myalgias  Has left knee pain .  Skin: Negative.   Neurological: Negative for headaches.  Psychiatric/Behavioral: Positive for depression.    Physical Exam Constitutional: No distress.  Thin   Neck: Neck supple. No JVD present. No thyromegaly present.  Cardiovascular: Normal rate and intact distal pulses.   Heart rate irregular   Respiratory: lungs clear; no respiratory distress  GI: Soft. She exhibits no distension.  Musculoskeletal: She exhibits no edema.  Is able to move all extremities Left knee  slight swelling present; has pain with movement no signs of inflammation present Has bilateral resting tremors present   Neurological: She is alert.  Skin: Skin is warm and dry. She is not diaphoretic.        ASSESSMENT/ PLAN:  1.left knee osteoarthritis: will begin voltaren gel 4 gm four times daily; and will have therapy evaluate and treat as indicated.    Ok Edwards NP [facility]  Contact [phone] Monday through Friday 8am- 5pm  After hours call [phone]

## 2015-06-03 ENCOUNTER — Encounter: Payer: Self-pay | Admitting: Adult Health

## 2015-06-03 NOTE — Progress Notes (Signed)
Patient ID: [name] [name], female   DOB: 1936/11/12, 79 y.o.   MRN: [id number]  [facility]     Allergies  Allergen Reactions  . Penicillins        Chief Complaint  Patient presents with  . Medical Management of Chronic Issues    HPI:  She is a long term resident of this facility being seen for the management of her chronic illnesses. Her knee pain is improving; but not relieved. We discussed her pain management and have decided to begin routine tylenol. She is having heartburn as well. There are no nursing concerns at this time.    Past Medical History  Diagnosis Date  . Vitamin D deficiency   . Edema   . Anemia   . Hypokalemia   . Depression   . Anxiety   . Parkinson disease   . Chronic pain   . Hypertension   . A-fib   . Constipation   . Chronic kidney disease   . UTI (lower urinary tract infection)   . Insomnia   . Intertrochanteric fracture   . Gait disturbance     Past Surgical History  Procedure Laterality Date  . Right hip surgery due to fracture      VITAL SIGNS BP 124/86 mmHg  Pulse 88  Ht 5\' 2"  (1.575 m)  Wt 129 lb (58.514 kg)  BMI 23.59 kg/m2  SpO2 98%   Outpatient Encounter Prescriptions as of 05/01/2015  Medication Sig  . antiseptic oral rinse (BIOTENE) LIQD 15 mLs by Mouth Rinse route 2 (two) times daily.  Marland Kitchen aspirin 81 MG tablet Take 81 mg by mouth daily.  . carbidopa-levodopa (SINEMET IR) 25-100 MG per tablet Take 1 tablet by mouth 4 (four) times daily. For muscle spasms  . cholecalciferol (VITAMIN D) 1000 UNITS tablet Take 1,000 Units by mouth daily.  Marland Kitchen dextromethorphan 15 MG/5ML syrup Take 10 mLs by mouth 4 (four) times daily as needed for cough.  . diclofenac sodium (VOLTAREN) 1 % GEL Apply 2 g topically 4 (four) times daily. To knees  . diltiazem (CARDIZEM) 60 MG tablet Take 60 mg by mouth daily.  Marland Kitchen docusate sodium (COLACE) 100 MG capsule Take 100 mg by mouth 2 (two) times daily.  Marland Kitchen FLUoxetine (PROZAC) 20 MG capsule Take 60 mg by  mouth daily. Take 3 tablets to equal 60 mg for anxiety.  . furosemide (LASIX) 20 MG tablet Take 20 mg by mouth daily.  Marland Kitchen lamoTRIgine (LAMICTAL) 150 MG tablet Take 150 mg by mouth daily.  Marland Kitchen LORazepam (ATIVAN) 0.5 MG tablet Take 0.5 mg by mouth at bedtime.  . Melatonin 3 MG CAPS Take 6 mg by mouth at bedtime.  . metoprolol tartrate (LOPRESSOR) 25 MG tablet Take 12.5 mg by mouth daily.   . Multiple Vitamin (DAILY VITE) TABS Take by mouth daily. Take 1 tablet  . oxyCODONE (ROXICODONE) 5 MG immediate release tablet Take one tablet by mouth every 4 hours as needed for pain  . promethazine (PHENERGAN) 25 MG tablet 25 mg daily.  . QUEtiapine (SEROQUEL) 100 MG tablet Take 100 mg by mouth at bedtime. For major depressive disorder  . ropinirole (REQUIP) 5 MG tablet Take 5 mg by mouth at bedtime. For anxiety  . sennosides-docusate sodium (SENOKOT-S) 8.6-50 MG tablet Take 2 tablets by mouth daily.      SIGNIFICANT DIAGNOSTIC EXAMS  03-26-15: left knee x-ray: modest osteoarthritis  03-26-15: left hip with pelvis x-ray; modest osteoarthritis left hip     LABS REVIEWED:  11-19-14: wbc 10.0; hgb 10.4; hct 36.0; mcv 88.7; plt 423 glucose 87; bun 20.9; creat 0.87 ;k+5.0; na++141; liver normal albumin 3.6 vit d 19.27  12-02-14: wbc 10.0; hgb 9.9; hct 32.7; mcv 87.4 ;plt 428 02-27-15: wbc 7.2; hgb 10.7; hct 34.5; mcv 90; plt 285 glucose 91; bun 18.9; creat 0.92; k+4.4; na++141; liver normal albumin 3.4      ROS Constitutional: Negative for malaise/fatigue.  Respiratory: no cough no shortness of breath  Cardiovascular: Negative for chest pain and leg swelling.  Gastrointestinal: Negative for  abdominal pain and constipation. has gerd Musculoskeletal: Negative for myalgias  Has left knee pain is improving .  Skin: Negative.   Neurological: Negative for headaches.  Psychiatric/Behavioral: negative for depression      Physical Exam Constitutional: No distress.  Thin   Neck: Neck supple. No JVD  present. No thyromegaly present.  Breast exam negative  Cardiovascular: Normal rate and intact distal pulses.   Heart rate irregular   Respiratory: lungs clear; no respiratory distress  GI: Soft. She exhibits no distension.  Musculoskeletal: She exhibits no edema.  Is able to move all extremities Left knee has pain with movement no signs of inflammation present Has bilateral resting tremors present   Neurological: She is alert.  Skin: Skin is warm and dry. She is not diaphoretic.        ASSESSMENT/ PLAN:  1. Parkinson disease: no change in status will continue sinemet 25/100 mg four times daily and requip 5 mg daily and will monitor   2. Afib: heart rate is stable will continue lopressor 12.5 mg daily and cardizem 60 mg daily for rate control and asa 81 mg daily   3. Hypertension: is stable will continue lopressor 12.5 mg daily and cardizem 50 mg daily   4. Constipation: will continue senna s 2 tabs daily and colace twice daily   5. Edema: is stable will continue lasix 20 mg daily  6. Major depression: she is followed by [facility]; will continue prozac 60 mg daily; lamictal 150 mg daily to help stabilize mood;  And seroquel 100 mg nightly will monitor  7. Insomnia: is without change : will continue melatonin 6 mg nightly; the remeron and belsomra have been stopped.   8. Left knee osteoarthritis: will continue voltaren gel 2 gm to left knee 4 times daily and will begin tylenol 650 mg three times daily will monitor   9. Gerd: will begin zantac 150 mg nightly     Ok Edwards NP [facility]  Contact [phone] Monday through Friday 8am- 5pm  After hours call [phone]

## 2015-06-05 ENCOUNTER — Non-Acute Institutional Stay (SKILLED_NURSING_FACILITY): Payer: Medicare Other | Admitting: Adult Health

## 2015-06-05 DIAGNOSIS — I10 Essential (primary) hypertension: Secondary | ICD-10-CM | POA: Diagnosis not present

## 2015-06-05 DIAGNOSIS — K5900 Constipation, unspecified: Secondary | ICD-10-CM

## 2015-06-05 DIAGNOSIS — G4700 Insomnia, unspecified: Secondary | ICD-10-CM | POA: Diagnosis not present

## 2015-06-05 DIAGNOSIS — R609 Edema, unspecified: Secondary | ICD-10-CM | POA: Diagnosis not present

## 2015-06-05 DIAGNOSIS — I480 Paroxysmal atrial fibrillation: Secondary | ICD-10-CM | POA: Diagnosis not present

## 2015-06-05 DIAGNOSIS — F330 Major depressive disorder, recurrent, mild: Secondary | ICD-10-CM

## 2015-06-05 DIAGNOSIS — M1712 Unilateral primary osteoarthritis, left knee: Secondary | ICD-10-CM

## 2015-06-05 DIAGNOSIS — G20 Parkinson's disease: Secondary | ICD-10-CM

## 2015-07-03 ENCOUNTER — Non-Acute Institutional Stay (SKILLED_NURSING_FACILITY): Payer: Medicare Other | Admitting: Internal Medicine

## 2015-07-03 DIAGNOSIS — F411 Generalized anxiety disorder: Secondary | ICD-10-CM | POA: Diagnosis not present

## 2015-07-03 DIAGNOSIS — I480 Paroxysmal atrial fibrillation: Secondary | ICD-10-CM

## 2015-07-03 DIAGNOSIS — F330 Major depressive disorder, recurrent, mild: Secondary | ICD-10-CM | POA: Diagnosis not present

## 2015-07-03 DIAGNOSIS — G20 Parkinson's disease: Secondary | ICD-10-CM | POA: Diagnosis not present

## 2015-07-03 DIAGNOSIS — D6489 Other specified anemias: Secondary | ICD-10-CM | POA: Diagnosis not present

## 2015-07-03 DIAGNOSIS — M1712 Unilateral primary osteoarthritis, left knee: Secondary | ICD-10-CM | POA: Diagnosis not present

## 2015-07-03 DIAGNOSIS — I10 Essential (primary) hypertension: Secondary | ICD-10-CM | POA: Diagnosis not present

## 2015-07-07 DIAGNOSIS — E039 Hypothyroidism, unspecified: Secondary | ICD-10-CM | POA: Diagnosis not present

## 2015-07-07 DIAGNOSIS — Z79899 Other long term (current) drug therapy: Secondary | ICD-10-CM | POA: Diagnosis not present

## 2015-07-07 DIAGNOSIS — I120 Hypertensive chronic kidney disease with stage 5 chronic kidney disease or end stage renal disease: Secondary | ICD-10-CM | POA: Diagnosis not present

## 2015-07-07 DIAGNOSIS — D649 Anemia, unspecified: Secondary | ICD-10-CM | POA: Diagnosis not present

## 2015-07-07 DIAGNOSIS — I4891 Unspecified atrial fibrillation: Secondary | ICD-10-CM | POA: Diagnosis not present

## 2015-07-07 DIAGNOSIS — R6889 Other general symptoms and signs: Secondary | ICD-10-CM | POA: Diagnosis not present

## 2015-07-08 ENCOUNTER — Encounter: Payer: Self-pay | Admitting: Adult Health

## 2015-07-08 NOTE — Progress Notes (Signed)
Patient ID: [name] [name], female   DOB: [birth year], 79 y.o.   MRN: [id number]  [facility]     Allergies  Allergen Reactions  . Penicillins        Chief Complaint  Patient presents with  . Medical Management of Chronic Issues    HPI:  She is a long term resident of this facility being seen for the management of her chronic illnesses. Overall she remains stable. She has been treated for an uti this past month without complication. She is not voicing any complaints or concerns. There are no nursing concerns at this time.    Past Medical History  Diagnosis Date  . Vitamin D deficiency   . Edema   . Anemia   . Hypokalemia   . Depression   . Anxiety   . Parkinson disease   . Chronic pain   . Hypertension   . A-fib   . Constipation   . Chronic kidney disease   . UTI (lower urinary tract infection)   . Insomnia   . Intertrochanteric fracture   . Gait disturbance     Past Surgical History  Procedure Laterality Date  . Right hip surgery due to fracture      VITAL SIGNS BP 118/68 mmHg  Pulse 80  Ht 5\' 2"  (1.575 m)  Wt 130 lb (58.968 kg)  BMI 23.77 kg/m2  SpO2 98%   Outpatient Encounter Prescriptions as of 06/05/2015  Medication Sig  . acetaminophen (TYLENOL) 325 MG tablet Take 650 mg by mouth 3 (three) times daily.  Marland Kitchen antiseptic oral rinse (BIOTENE) LIQD 15 mLs by Mouth Rinse route 2 (two) times daily.  Marland Kitchen aspirin 81 MG tablet Take 81 mg by mouth daily.  . carbidopa-levodopa (SINEMET IR) 25-100 MG per tablet Take 1 tablet by mouth 4 (four) times daily. For muscle spasms  . cholecalciferol (VITAMIN D) 1000 UNITS tablet Take 1,000 Units by mouth daily.  Marland Kitchen dextromethorphan 15 MG/5ML syrup Take 10 mLs by mouth 4 (four) times daily as needed for cough.  . diclofenac sodium (VOLTAREN) 1 % GEL Apply 2 g topically 4 (four) times daily. To knees  . diltiazem (CARDIZEM) 60 MG tablet Take 60 mg by mouth daily.  Marland Kitchen docusate sodium (COLACE) 100 MG capsule Take 100 mg by mouth 2  (two) times daily.  Marland Kitchen FLUoxetine (PROZAC) 20 MG capsule Take 60 mg by mouth daily. Take 3 tablets to equal 60 mg for anxiety.  . furosemide (LASIX) 20 MG tablet Take 20 mg by mouth daily.  Marland Kitchen lamoTRIgine (LAMICTAL) 150 MG tablet Take 150 mg by mouth daily.  Marland Kitchen LORazepam (ATIVAN) 0.5 MG tablet Take 0.5 mg by mouth at bedtime.  . Melatonin 3 MG CAPS Take 6 mg by mouth at bedtime.  . metoprolol tartrate (LOPRESSOR) 25 MG tablet Take 12.5 mg by mouth daily.   . Multiple Vitamin (DAILY VITE) TABS Take by mouth daily. Take 1 tablet  . oxyCODONE (ROXICODONE) 5 MG immediate release tablet Take one tablet by mouth every 4 hours as needed for pain  . QUEtiapine (SEROQUEL) 100 MG tablet Take 125 mg by mouth at bedtime. For major depressive disorder  . ranitidine (ZANTAC) 150 MG tablet Take 150 mg by mouth at bedtime.  . ropinirole (REQUIP) 5 MG tablet Take 5 mg by mouth at bedtime. For anxiety  . sennosides-docusate sodium (SENOKOT-S) 8.6-50 MG tablet Take 2 tablets by mouth daily.      SIGNIFICANT DIAGNOSTIC EXAMS   03-26-15: left knee x-ray: modest  osteoarthritis  03-26-15: left hip with pelvis x-ray; modest osteoarthritis left hip     LABS REVIEWED:   11-19-14: wbc 10.0; hgb 10.4; hct 36.0; mcv 88.7; plt 423 glucose 87; bun 20.9; creat 0.87 ;k+5.0; na++141; liver normal albumin 3.6 vit d 19.27  12-02-14: wbc 10.0; hgb 9.9; hct 32.7; mcv 87.4 ;plt 428 02-27-15: wbc 7.2; hgb 10.7; hct 34.5; mcv 90; plt 285 glucose 91; bun 18.9; creat 0.92; k+4.4; na++141; liver normal albumin 3.4  05-02-15: hgb a1c 5.6 05-16-15:urine culture: e-coli: cipro      ROS Constitutional: Negative for malaise/fatigue.  Respiratory: no cough no shortness of breath  Cardiovascular: Negative for chest pain and leg swelling.  Gastrointestinal: Negative for  abdominal pain and constipation. No gerd  Musculoskeletal: Negative for myalgias  Has left knee pain is managed.  Skin: Negative.   Neurological: Negative for  headaches.  Psychiatric/Behavioral: negative for depression     Physical Exam Constitutional: No distress.  Thin   Neck: Neck supple. No JVD present. No thyromegaly present.  Breast exam negative  Cardiovascular: Normal rate and intact distal pulses.   Heart rate irregular   Respiratory: lungs clear; no respiratory distress  GI: Soft. She exhibits no distension.  Musculoskeletal: She exhibits no edema.  Is able to move all extremities Has bilateral resting tremors present   Neurological: She is alert.  Skin: Skin is warm and dry. She is not diaphoretic.       ASSESSMENT/ PLAN:  1. Parkinson disease: no change in status will continue sinemet 25/100 mg four times daily and requip 5 mg daily and will monitor   2. Afib: heart rate is stable will continue lopressor 12.5 mg daily and cardizem 60 mg daily for rate control and asa 81 mg daily   3. Hypertension: is stable will continue lopressor 12.5 mg daily and cardizem 60 mg daily   4. Constipation: will continue senna s 2 tabs daily and colace twice daily   5. Edema: is stable will continue lasix 20 mg daily  6. Major depression: she is followed by [facility]; will continue prozac 60 mg daily; lamictal 150 mg daily to help stabilize mood;  And seroquel 125 mg nightly will monitor  7. Insomnia: is without change : will continue melatonin 6 mg nightly; the remeron and belsomra have been stopped.   8. Left knee osteoarthritis: will continue voltaren gel 2 gm to left knee 4 times daily and tylenol 650 mg three times daily will monitor   9. Gerd: will continue  zantac 150 mg nightly   Ok Edwards NP [facility]  Contact [phone] Monday through Friday 8am- 5pm  After hours call [phone]

## 2015-07-10 NOTE — Progress Notes (Signed)
Patient ID: [name] [name], female   DOB: 1936-05-29, 79 y.o.   MRN: [id number]    DATE: 07/03/15  Location:  [facility]    Place of Service: SNF [phone])   Extended Emergency Contact Information Primary Emergency Contact: Folds,Bobby E Address: [address] [place], [place] [address] [place] Phone: [phone] Relation: Spouse Secondary Emergency Contact: Way,Cheryl Address: [address] [place] [address] [place] Phone: [phone] Relation: Daughter  Advanced Directive information  FULL CODE  Chief Complaint  Patient presents with  . Medical Management of Chronic Issues    HPI:  79 yo female long term resident seen today for f/u. She c/o left knee pain with movement. PT not helping. She fell a few mos ago and it has hurt ever since. xrays showed severe OA. She does not ambulate.   Chronic pain is controlled with tylenol and voltaren gel  She still has issues with sleeping but it is improved with increased melatonin.   afib is controlled with cardizem and metoprolol. BP stable. She takes ASA daily  Mood stable on seroquel and lamictal. Followed by psych  She takes zantac for GERD sx's  Parkinson's - tremor stable on sinemet  Past Medical History  Diagnosis Date  . Vitamin D deficiency   . Edema   . Anemia   . Hypokalemia   . Depression   . Anxiety   . Parkinson disease ([place])   . Chronic pain   . Hypertension   . A-fib ([place])   . Constipation   . Chronic kidney disease   . UTI (lower urinary tract infection)   . Insomnia   . Intertrochanteric fracture ([place])   . Gait disturbance     Past Surgical History  Procedure Laterality Date  . Right hip surgery due to fracture      Patient Care Team: Hennie Duos, MD as PCP - General (Internal Medicine) Gerlene Fee, NP as Nurse Practitioner (Nurse Practitioner) [place]  Social History   Social History  . Marital Status: Married    Spouse Name: N/A  . Number of Children: N/A  . Years of Education: N/A   Occupational History  . Not on file.   Social History Main Topics  . Smoking status: Never Smoker   . Smokeless tobacco: Never Used  . Alcohol Use: No  . Drug Use: No  . Sexual Activity: Not on file   Other Topics Concern  . Not on file   Social History Narrative     reports that she has never smoked. She has never used smokeless tobacco. She reports that she does not drink alcohol or use illicit drugs.  Immunization History  Administered Date(s) Administered  . Influenza-Unspecified 04/28/2013, 10/09/2014  . PPD Test 03/16/2011    Allergies  Allergen Reactions  . Penicillins     Medications: Patient's Medications  New Prescriptions   QUETIAPINE (SEROQUEL) 25 MG TABLET    Take 0.5 tablets (12.5 mg total) by mouth at bedtime.  Previous Medications   ACETAMINOPHEN (TYLENOL) 325 MG TABLET    Take 650 mg by mouth 3 (three) times daily.   ANTISEPTIC ORAL RINSE (BIOTENE) LIQD    15 mLs by Mouth Rinse route 2 (two) times daily.   ASPIRIN 81 MG TABLET  Take 81 mg by mouth daily.   CARBIDOPA-LEVODOPA (SINEMET IR) 25-100 MG PER TABLET    Take 1 tablet by mouth 4 (four) times daily. For muscle spasms   CHOLECALCIFEROL (VITAMIN D) 1000 UNITS TABLET    Take 1,000 Units by mouth daily.   DEXTROMETHORPHAN 15 MG/5ML SYRUP    Take 10 mLs by mouth 4 (four) times daily as needed for cough.   DICLOFENAC SODIUM (VOLTAREN) 1 % GEL    Apply 2 g topically 4 (four) times daily. To knees   DILTIAZEM (CARDIZEM) 60 MG TABLET    Take 60 mg by mouth daily.   DOCUSATE SODIUM (COLACE) 100 MG CAPSULE    Take 100 mg by mouth 2 (two) times daily.   FLUOXETINE (PROZAC) 20 MG CAPSULE    Take 60 mg by mouth daily. Take 3 tablets to equal 60 mg for anxiety.   FUROSEMIDE (LASIX) 20 MG TABLET    Take 20 mg by mouth daily.   LAMOTRIGINE (LAMICTAL) 150 MG  TABLET    Take 150 mg by mouth daily.   MELATONIN 3 MG CAPS    Take 6 mg by mouth at bedtime.   METOPROLOL TARTRATE (LOPRESSOR) 25 MG TABLET    Take 12.5 mg by mouth daily.    MULTIPLE VITAMIN (DAILY VITE) TABS    Take by mouth daily. Take 1 tablet   OXYCODONE (ROXICODONE) 5 MG IMMEDIATE RELEASE TABLET    Take one tablet by mouth every 4 hours as needed for pain   QUETIAPINE (SEROQUEL) 100 MG TABLET    Take 125 mg by mouth at bedtime. For major depressive disorder   RANITIDINE (ZANTAC) 150 MG TABLET    Take 150 mg by mouth at bedtime.   ROPINIROLE (REQUIP) 5 MG TABLET    Take 5 mg by mouth at bedtime. For anxiety   SENNOSIDES-DOCUSATE SODIUM (SENOKOT-S) 8.6-50 MG TABLET    Take 2 tablets by mouth daily.  Modified Medications   No medications on file  Discontinued Medications   LORAZEPAM (ATIVAN) 0.5 MG TABLET    Take 0.5 mg by mouth at bedtime.    Review of Systems  Musculoskeletal: Positive for arthralgias and gait problem.  Neurological: Positive for tremors.  Psychiatric/Behavioral: Positive for sleep disturbance.  All other systems reviewed and are negative.   Filed Vitals:   07/03/15 0004  BP: 120/60  Pulse: 68  Temp: 98.2 F (36.8 C)  SpO2: 98%   There is no weight on file to calculate BMI.  Physical Exam  Constitutional: She is oriented to person, place, and time. She appears well-developed.  Lying in bed in NAD. Frail appearing  HENT:  Mouth/Throat: Oropharynx is clear and moist. No oropharyngeal exudate.  Eyes: Pupils are equal, round, and reactive to light. No scleral icterus.  Neck: Neck supple. Carotid bruit is not present. No tracheal deviation present. No thyromegaly present.  Cardiovascular: Normal rate, regular rhythm and intact distal pulses.  Exam reveals no gallop and no friction rub.   Murmur (1/6 SEM) heard. No LE edema b/l. no calf TTP.   Pulmonary/Chest: Effort normal and breath sounds normal. No stridor. No respiratory distress. She has no wheezes.  She has no rales.  Abdominal: Soft. Bowel sounds are normal. She exhibits no distension and no mass. There is no hepatomegaly. There is no tenderness. There is no rebound and no guarding.  Musculoskeletal: She exhibits edema and tenderness.  Left knee swelling with reduced extension and flexion; grinds with ROM and crepitus noted  Lymphadenopathy:    She has no cervical adenopathy.  Neurological: She is alert and oriented to person, place, and time. She displays tremor (resting).  Skin: Skin is warm and dry. No rash noted.  Psychiatric: She has a normal mood and affect. Her behavior is normal. Judgment and thought content normal.     Labs reviewed: No visits with results within 3 Month(s) from this visit. Latest known visit with results is:  Nursing Home on 02/23/2015  Component Date Value Ref Range Status  . Hemoglobin 12/02/2014 9.9* 12.0 - 16.0 g/dL Final  . HCT 12/02/2014 33* 36 - 46 % Final  . Platelets 12/02/2014 428* 150 - 399 K/L Final  . WBC 12/02/2014 10.0   Final   05/02/15: A1c 5/6%  No results found.   Assessment/Plan   ICD-9-CM ICD-10-CM   1. Primary osteoarthritis of left knee 715.16 M17.12   2. Parkinson disease ([place]) 332.0 G20   3. Essential hypertension, benign 401.1 I10   4. Paroxysmal atrial fibrillation (HCC) 427.31 I48.0   5. Major depressive disorder, recurrent episode, mild (HCC) 296.31 F33.0   6. Anemia due to other cause 285.8 D64.89   7. Generalized anxiety disorder 300.02 F41.1     --check CBC w diff, CMP and TSH  --t/c ortho referral for knee pain  Cont current meds as ordered  PT/OT as indicated  Will follow  Sol Englert S. Perlie Gold  [facility] [address] [place], [place] [address] [phone] Cell (Monday-Friday 8 AM - 5 PM) [phone] After 5 PM and follow prompts

## 2015-08-12 ENCOUNTER — Non-Acute Institutional Stay (SKILLED_NURSING_FACILITY): Payer: Medicare Other | Admitting: Adult Health

## 2015-08-12 DIAGNOSIS — G20 Parkinson's disease: Secondary | ICD-10-CM

## 2015-08-12 DIAGNOSIS — I10 Essential (primary) hypertension: Secondary | ICD-10-CM

## 2015-08-12 DIAGNOSIS — F330 Major depressive disorder, recurrent, mild: Secondary | ICD-10-CM | POA: Diagnosis not present

## 2015-08-12 DIAGNOSIS — G4700 Insomnia, unspecified: Secondary | ICD-10-CM | POA: Diagnosis not present

## 2015-08-12 DIAGNOSIS — R609 Edema, unspecified: Secondary | ICD-10-CM | POA: Diagnosis not present

## 2015-08-12 DIAGNOSIS — M1712 Unilateral primary osteoarthritis, left knee: Secondary | ICD-10-CM | POA: Diagnosis not present

## 2015-08-12 DIAGNOSIS — I480 Paroxysmal atrial fibrillation: Secondary | ICD-10-CM | POA: Diagnosis not present

## 2015-09-11 DIAGNOSIS — B351 Tinea unguium: Secondary | ICD-10-CM | POA: Diagnosis not present

## 2015-09-11 DIAGNOSIS — M79675 Pain in left toe(s): Secondary | ICD-10-CM | POA: Diagnosis not present

## 2015-09-11 DIAGNOSIS — M79674 Pain in right toe(s): Secondary | ICD-10-CM | POA: Diagnosis not present

## 2015-09-11 DIAGNOSIS — E1159 Type 2 diabetes mellitus with other circulatory complications: Secondary | ICD-10-CM | POA: Diagnosis not present

## 2015-09-20 ENCOUNTER — Encounter: Payer: Self-pay | Admitting: Adult Health

## 2015-09-20 NOTE — Progress Notes (Signed)
Patient ID: [name] [name], female   DOB: 10-Oct-1936, 79 y.o.   MRN: [id number]    Facility: [facility]      Allergies  Allergen Reactions  . Penicillins     Chief Complaint  Patient presents with  . Medical Management of Chronic Issues    HPI:  She is a long term resident of this facility being seen for the management of her chronic illnesses. Overall there is little change in her status. She is not voicing any complaints or concerns at this time. There are no nursing concerns at this time   Past Medical History  Diagnosis Date  . Vitamin D deficiency   . Edema   . Anemia   . Hypokalemia   . Depression   . Anxiety   . Parkinson disease   . Chronic pain   . Hypertension   . A-fib   . Constipation   . Chronic kidney disease   . UTI (lower urinary tract infection)   . Insomnia   . Intertrochanteric fracture   . Gait disturbance     Past Surgical History  Procedure Laterality Date  . Right hip surgery due to fracture      VITAL SIGNS BP 122/68 mmHg  Pulse 86  Ht 5\' 2"  (1.575 m)  Wt 133 lb (60.328 kg)  BMI 24.32 kg/m2  SpO2 98%  Patient's Medications  New Prescriptions   No medications on file  Previous Medications   ACETAMINOPHEN (TYLENOL) 325 MG TABLET    Take 650 mg by mouth 3 (three) times daily.   ANTISEPTIC ORAL RINSE (BIOTENE) LIQD    15 mLs by Mouth Rinse route 2 (two) times daily.   ASPIRIN 81 MG TABLET    Take 81 mg by mouth daily.   CARBIDOPA-LEVODOPA (SINEMET IR) 25-100 MG PER TABLET    Take 1 tablet by mouth 4 (four) times daily. For muscle spasms   CHOLECALCIFEROL (VITAMIN D) 1000 UNITS TABLET    Take 1,000 Units by mouth daily.   DEXTROMETHORPHAN 15 MG/5ML SYRUP    Take 10 mLs by mouth 4 (four) times daily as needed for cough.   DICLOFENAC SODIUM (VOLTAREN) 1 % GEL    Apply 2 g topically 4 (four) times daily. To knees   DILTIAZEM (CARDIZEM) 60 MG TABLET    Take 60 mg by mouth daily.   DOCUSATE SODIUM (COLACE) 100 MG CAPSULE     Take 100 mg by mouth 2 (two) times daily.   FLUOXETINE (PROZAC) 20 MG CAPSULE    Take 60 mg by mouth daily. Take 3 tablets to equal 60 mg for anxiety.   FUROSEMIDE (LASIX) 20 MG TABLET    Take 20 mg by mouth daily.   LAMOTRIGINE (LAMICTAL) 150 MG TABLET    Take 150 mg by mouth daily.   MELATONIN 3 MG CAPS    Take 6 mg by mouth at bedtime.   METOPROLOL TARTRATE (LOPRESSOR) 25 MG TABLET    Take 12.5 mg by mouth daily.    MULTIPLE VITAMIN (DAILY VITE) TABS    Take by mouth daily. Take 1 tablet   OXYCODONE (ROXICODONE) 5 MG IMMEDIATE RELEASE TABLET    Take one tablet by mouth every 4 hours as needed for pain   QUETIAPINE (SEROQUEL) 100 MG TABLET    Take 125 mg by mouth at bedtime. For major depressive disorder   RANITIDINE (ZANTAC) 150 MG TABLET    Take 150 mg by mouth at bedtime.   ROPINIROLE (REQUIP)  5 MG TABLET    Take 5 mg by mouth at bedtime. For anxiety   SENNOSIDES-DOCUSATE SODIUM (SENOKOT-S) 8.6-50 MG TABLET    Take 2 tablets by mouth daily.  Modified Medications   No medications on file  Discontinued Medications     SIGNIFICANT DIAGNOSTIC EXAMS   03-26-15: left knee x-ray: modest osteoarthritis  03-26-15: left hip with pelvis x-ray; modest osteoarthritis left hip     LABS REVIEWED:   11-19-14: wbc 10.0; hgb 10.4; hct 36.0; mcv 88.7; plt 423 glucose 87; bun 20.9; creat 0.87 ;k+5.0; na++141; liver normal albumin 3.6 vit d 19.27  12-02-14: wbc 10.0; hgb 9.9; hct 32.7; mcv 87.4 ;plt 428 02-27-15: wbc 7.2; hgb 10.7; hct 34.5; mcv 90; plt 285 glucose 91; bun 18.9; creat 0.92; k+4.4; na++141; liver normal albumin 3.4  05-02-15: hgb a1c 5.6 05-16-15:urine culture: e-coli: cipro 07-07-15: wbc 8.4; hgb 11.5; hct 38.3; mcv 92.7; plt 324; glucose 104; bun 31.7; creat 0.93; k+ 5.0; na++ 141; liver normal albumin 4.0; tsh 1.52      Review of Systems Constitutional: Negative for malaise/fatigue.  Respiratory: no cough no shortness of breath  Cardiovascular: Negative for chest pain and leg  swelling.  Gastrointestinal: Negative for  abdominal pain and constipation. No gerd  Musculoskeletal: Negative for myalgias  Has left knee pain is managed.  Skin: Negative.   Neurological: Negative for headaches.  Psychiatric/Behavioral: negative for depression     Physical Exam Constitutional: No distress.  Thin   Neck: Neck supple. No JVD present. No thyromegaly present.  Breast exam negative  Cardiovascular: Normal rate and intact distal pulses.   Heart rate irregular   Respiratory: lungs clear; no respiratory distress  GI: Soft. She exhibits no distension.  Musculoskeletal: She exhibits no edema.  Is able to move all extremities Has bilateral resting tremors present   Neurological: She is alert.  Skin: Skin is warm and dry. She is not diaphoretic.     ASSESSMENT/ PLAN:  1. Parkinson disease: no change in status will continue sinemet 25/100 mg four times daily and requip 5 mg daily and will monitor   2. Afib: heart rate is stable will continue lopressor 12.5 mg daily and cardizem 60 mg daily for rate control and asa 81 mg daily   3. Hypertension: is stable will continue lopressor 12.5 mg daily and cardizem 60 mg daily   4. Constipation: will continue senna s 2 tabs daily and colace twice daily   5. Edema: is stable will continue lasix 20 mg daily  6. Major depression: she is followed by [facility]; will continue prozac 60 mg daily; lamictal 150 mg daily to help stabilize mood;  And seroquel 125 mg nightly will monitor  7. Insomnia: is without change : will continue melatonin 6 mg nightly; the remeron and belsomra have been stopped.   8. Left knee osteoarthritis: will continue voltaren gel 2 gm to left knee 4 times daily and tylenol 650 mg three times daily will monitor   9. Gerd: will continue  zantac 150 mg nightly      Ok Edwards NP [facility]  Contact [phone] Monday through Friday 8am- 5pm  After hours call [phone]

## 2015-10-08 ENCOUNTER — Non-Acute Institutional Stay (SKILLED_NURSING_FACILITY): Payer: Medicare Other | Admitting: Adult Health

## 2015-10-08 DIAGNOSIS — I10 Essential (primary) hypertension: Secondary | ICD-10-CM | POA: Diagnosis not present

## 2015-10-08 DIAGNOSIS — G20A1 Parkinson's disease without dyskinesia, without mention of fluctuations: Secondary | ICD-10-CM

## 2015-10-08 DIAGNOSIS — R609 Edema, unspecified: Secondary | ICD-10-CM | POA: Diagnosis not present

## 2015-10-08 DIAGNOSIS — G20 Parkinson's disease: Secondary | ICD-10-CM | POA: Diagnosis not present

## 2015-10-08 DIAGNOSIS — F339 Major depressive disorder, recurrent, unspecified: Secondary | ICD-10-CM

## 2015-10-08 DIAGNOSIS — K5900 Constipation, unspecified: Secondary | ICD-10-CM

## 2015-10-08 DIAGNOSIS — M1712 Unilateral primary osteoarthritis, left knee: Secondary | ICD-10-CM | POA: Diagnosis not present

## 2015-10-08 DIAGNOSIS — I480 Paroxysmal atrial fibrillation: Secondary | ICD-10-CM

## 2015-10-24 ENCOUNTER — Encounter: Payer: Self-pay | Admitting: Adult Health

## 2015-10-24 NOTE — Progress Notes (Signed)
Patient ID: [name] [name], female   DOB: 1936-11-11, 79 y.o.   MRN: [id number]    Facility: [facility]      Allergies  Allergen Reactions  . Penicillins     Chief Complaint  Patient presents with  . Medical Management of Chronic Issues    HPI:  She is a long term resident of this facility being seen for the management of her chronic illnesses. Overall her status remains stable. She is not voicing any complaints or concerns today stating that she is feeling good. There are no nursing concerns at this time.    Past Medical History  Diagnosis Date  . Vitamin D deficiency   . Edema   . Anemia   . Hypokalemia   . Depression   . Anxiety   . Parkinson disease ([place])   . Chronic pain   . Hypertension   . A-fib ([place])   . Constipation   . Chronic kidney disease   . UTI (lower urinary tract infection)   . Insomnia   . Intertrochanteric fracture ([place])   . Gait disturbance     Past Surgical History  Procedure Laterality Date  . Right hip surgery due to fracture      VITAL SIGNS BP 120/70 mmHg  Pulse 86  Ht 5\' 2"  (1.575 m)  Wt 133 lb (60.328 kg)  BMI 24.32 kg/m2  Patient's Medications  New Prescriptions   No medications on file  Previous Medications   ACETAMINOPHEN (TYLENOL) 325 MG TABLET    Take 650 mg by mouth 3 (three) times daily.   ANTISEPTIC ORAL RINSE (BIOTENE) LIQD    15 mLs by Mouth Rinse route 2 (two) times daily.   ASPIRIN 81 MG TABLET    Take 81 mg by mouth daily.   CARBIDOPA-LEVODOPA (SINEMET IR) 25-100 MG PER TABLET    Take 1 tablet by mouth 4 (four) times daily. For muscle spasms   CHOLECALCIFEROL (VITAMIN D) 1000 UNITS TABLET    Take 1,000 Units by mouth daily.   DEXTROMETHORPHAN 15 MG/5ML SYRUP    Take 10 mLs by mouth 4 (four) times daily as needed for cough.   DICLOFENAC SODIUM (VOLTAREN) 1 % GEL    Apply 2 g topically 4 (four) times daily. To knees   DILTIAZEM (CARDIZEM) 60 MG TABLET    Take 60 mg by mouth daily.   DOCUSATE SODIUM  (COLACE) 100 MG CAPSULE    Take 100 mg by mouth 2 (two) times daily.   FLUOXETINE (PROZAC) 20 MG CAPSULE    Take 60 mg by mouth daily. Take 3 tablets to equal 60 mg for anxiety.   FUROSEMIDE (LASIX) 20 MG TABLET    Take 20 mg by mouth daily.   LAMOTRIGINE (LAMICTAL) 150 MG TABLET    Take 150 mg by mouth daily.   MELATONIN 3 MG CAPS    Take 6 mg by mouth at bedtime.   METOPROLOL TARTRATE (LOPRESSOR) 25 MG TABLET    Take 12.5 mg by mouth daily.    MULTIPLE VITAMIN (DAILY VITE) TABS    Take by mouth daily. Take 1 tablet   OXYCODONE (ROXICODONE) 5 MG IMMEDIATE RELEASE TABLET    Take one tablet by mouth every 4 hours as needed for pain   QUETIAPINE (SEROQUEL) 100 MG TABLET    Take 125 mg by mouth at bedtime. For major depressive disorder   RANITIDINE (ZANTAC) 150 MG TABLET    Take 150 mg by mouth at bedtime.  ROPINIROLE (REQUIP) 5 MG TABLET    Take 5 mg by mouth at bedtime. For anxiety   SENNOSIDES-DOCUSATE SODIUM (SENOKOT-S) 8.6-50 MG TABLET    Take 2 tablets by mouth daily.  Modified Medications   No medications on file  Discontinued Medications   No medications on file     SIGNIFICANT DIAGNOSTIC EXAMS  03-26-15: left knee x-ray: modest osteoarthritis  03-26-15: left hip with pelvis x-ray; modest osteoarthritis left hip     LABS REVIEWED:   11-19-14: wbc 10.0; hgb 10.4; hct 36.0; mcv 88.7; plt 423 glucose 87; bun 20.9; creat 0.87 ;k+5.0; na++141; liver normal albumin 3.6 vit d 19.27  12-02-14: wbc 10.0; hgb 9.9; hct 32.7; mcv 87.4 ;plt 428 02-27-15: wbc 7.2; hgb 10.7; hct 34.5; mcv 90; plt 285 glucose 91; bun 18.9; creat 0.92; k+4.4; na++141; liver normal albumin 3.4  05-02-15: hgb a1c 5.6 05-16-15:urine culture: e-coli: cipro 07-07-15: wbc 8.4; hgb 11.5; hct 38.3; mcv 92.7; plt 324; glucose 104; bun 31.7; creat 0.93; k+ 5.0; na++ 141; liver normal albumin 4.0; tsh 1.52     Review of Systems Constitutional: Negative for malaise/fatigue.  Respiratory: no cough no shortness of breath    Cardiovascular: Negative for chest pain and leg swelling.  Gastrointestinal: Negative for  abdominal pain and constipation. No gerd  Musculoskeletal: Negative for myalgias  Has left knee pain is managed.  Skin: Negative.   Neurological: Negative for headaches.  Psychiatric/Behavioral: negative for depression     Physical Exam Constitutional: No distress.  Thin   Neck: Neck supple. No JVD present. No thyromegaly present.  Breast exam negative  Cardiovascular: Normal rate and intact distal pulses.   Heart rate irregular   Respiratory: lungs clear; no respiratory distress  GI: Soft. She exhibits no distension.  Musculoskeletal: She exhibits no edema.  Is able to move all extremities Has bilateral resting tremors present   Neurological: She is alert.  Skin: Skin is warm and dry. She is not diaphoretic.      ASSESSMENT/ PLAN:  1. Parkinson disease: no change in status will continue sinemet 25/100 mg four times daily and requip 5 mg daily and will monitor   2. Afib: heart rate is stable will continue lopressor 12.5 mg daily and cardizem 60 mg daily for rate control and asa 81 mg daily   3. Hypertension: is stable will continue lopressor 12.5 mg daily and cardizem 60 mg daily   4. Constipation: will continue senna s 2 tabs daily and daily prn and colace twice daily   5. Edema: is stable will continue lasix 20 mg daily  6. Major depression: she is followed by [facility]; will continue prozac 60 mg daily; lamictal 150 mg daily to help stabilize mood;  And seroquel 125 mg nightly will monitor  7. Insomnia: is without change : will continue melatonin 6 mg nightly; the remeron and belsomra have been stopped.   8. Left knee osteoarthritis: will continue voltaren gel 2 gm to left knee 4 times daily and tylenol 650 mg three times daily will monitor   9. Gerd: will continue  zantac 150 mg nightly    Ok Edwards NP [facility]  Contact [phone] Monday through Friday 8am-  5pm  After hours call [phone]

## 2015-11-13 ENCOUNTER — Non-Acute Institutional Stay (SKILLED_NURSING_FACILITY): Payer: Medicare Other | Admitting: Adult Health

## 2015-11-13 ENCOUNTER — Encounter: Payer: Self-pay | Admitting: Adult Health

## 2015-11-13 DIAGNOSIS — I10 Essential (primary) hypertension: Secondary | ICD-10-CM

## 2015-11-13 DIAGNOSIS — K5900 Constipation, unspecified: Secondary | ICD-10-CM | POA: Diagnosis not present

## 2015-11-13 DIAGNOSIS — F339 Major depressive disorder, recurrent, unspecified: Secondary | ICD-10-CM | POA: Diagnosis not present

## 2015-11-13 DIAGNOSIS — I482 Chronic atrial fibrillation, unspecified: Secondary | ICD-10-CM | POA: Insufficient documentation

## 2015-11-13 DIAGNOSIS — G4700 Insomnia, unspecified: Secondary | ICD-10-CM

## 2015-11-13 DIAGNOSIS — M1712 Unilateral primary osteoarthritis, left knee: Secondary | ICD-10-CM

## 2015-11-13 DIAGNOSIS — K5909 Other constipation: Secondary | ICD-10-CM | POA: Insufficient documentation

## 2015-11-13 DIAGNOSIS — R600 Localized edema: Secondary | ICD-10-CM

## 2015-11-13 DIAGNOSIS — G20 Parkinson's disease: Secondary | ICD-10-CM

## 2015-11-13 DIAGNOSIS — G20A1 Parkinson's disease without dyskinesia, without mention of fluctuations: Secondary | ICD-10-CM

## 2015-11-13 NOTE — Progress Notes (Signed)
Patient ID: [name] [name], female   DOB: Sep 14, 1936, 79 y.o.   MRN: [id number]   Facility:  [facility]      Allergies  Allergen Reactions  . Penicillins     Chief Complaint  Patient presents with  . Medical Management of Chronic Issues    HPI:  She is a long term resident of this facility being seen for the management of her chronic illnesses. Overall there is little change in her status. She is presently denying any knee pain at this time. She would like to start getting out of bed. She is sleeping a little bit at night. There are no nursing concerns today.    Past Medical History  Diagnosis Date  . Vitamin D deficiency   . Edema   . Anemia   . Hypokalemia   . Depression   . Anxiety   . Parkinson disease ([place])   . Chronic pain   . Hypertension   . A-fib ([place])   . Constipation   . Chronic kidney disease   . UTI (lower urinary tract infection)   . Insomnia   . Intertrochanteric fracture ([place])   . Gait disturbance     Past Surgical History  Procedure Laterality Date  . Right hip surgery due to fracture      VITAL SIGNS BP 120/70 mmHg  Pulse 80  Ht 5\' 2"  (1.575 m)  Wt 131 lb (59.421 kg)  BMI 23.95 kg/m2  SpO2 98%  Patient's Medications  New Prescriptions   No medications on file  Previous Medications   ACETAMINOPHEN (TYLENOL) 325 MG TABLET    Take 650 mg by mouth 3 (three) times daily.   ANTISEPTIC ORAL RINSE (BIOTENE) LIQD    15 mLs by Mouth Rinse route 2 (two) times daily.   ASPIRIN 81 MG TABLET    Take 81 mg by mouth daily.   CARBIDOPA-LEVODOPA (SINEMET IR) 25-100 MG PER TABLET    Take 1 tablet by mouth 4 (four) times daily. For muscle spasms   CHOLECALCIFEROL (VITAMIN D) 1000 UNITS TABLET    Take 1,000 Units by mouth daily.   DEXTROMETHORPHAN 15 MG/5ML SYRUP    Take 10 mLs by mouth 4 (four) times daily as needed for cough.   DICLOFENAC SODIUM (VOLTAREN) 1 % GEL    Apply 2 g topically 4 (four) times daily. To knees   DILTIAZEM (CARDIZEM) 60 MG TABLET     Take 60 mg by mouth daily.   DOCUSATE SODIUM (COLACE) 100 MG CAPSULE    Take 100 mg by mouth 2 (two) times daily.   FLUOXETINE (PROZAC) 20 MG CAPSULE    Take 60 mg by mouth daily. Take 3 tablets to equal 60 mg for anxiety.   FUROSEMIDE (LASIX) 20 MG TABLET    Take 20 mg by mouth daily.   LAMOTRIGINE (LAMICTAL) 150 MG TABLET    Take 150 mg by mouth daily.   MELATONIN 3 MG CAPS    Take 6 mg by mouth at bedtime.   METOPROLOL TARTRATE (LOPRESSOR) 25 MG TABLET    Take 12.5 mg by mouth daily.    MULTIPLE VITAMIN (DAILY VITE) TABS    Take by mouth daily. Take 1 tablet   OXYCODONE (ROXICODONE) 5 MG IMMEDIATE RELEASE TABLET    Take one tablet by mouth every 4 hours as needed for pain   QUETIAPINE (SEROQUEL) 100 MG TABLET    Take 125 mg by mouth at bedtime. For major depressive disorder   RANITIDINE (ZANTAC) 150  MG TABLET    Take 150 mg by mouth at bedtime.   ROPINIROLE (REQUIP) 5 MG TABLET    Take 5 mg by mouth at bedtime. For anxiety   SENNOSIDES-DOCUSATE SODIUM (SENOKOT-S) 8.6-50 MG TABLET    Take 2 tablets by mouth daily.  Modified Medications   No medications on file  Discontinued Medications   No medications on file     SIGNIFICANT DIAGNOSTIC EXAMS   03-26-15: left knee x-ray: modest osteoarthritis  03-26-15: left hip with pelvis x-ray; modest osteoarthritis left hip     LABS REVIEWED:   11-19-14: wbc 10.0; hgb 10.4; hct 36.0; mcv 88.7; plt 423 glucose 87; bun 20.9; creat 0.87 ;k+5.0; na++141; liver normal albumin 3.6 vit d 19.27  12-02-14: wbc 10.0; hgb 9.9; hct 32.7; mcv 87.4 ;plt 428 02-27-15: wbc 7.2; hgb 10.7; hct 34.5; mcv 90; plt 285 glucose 91; bun 18.9; creat 0.92; k+4.4; na++141; liver normal albumin 3.4  05-02-15: hgb a1c 5.6 05-16-15:urine culture: e-coli: cipro 07-07-15: wbc 8.4; hgb 11.5; hct 38.3; mcv 92.7; plt 324; glucose 104; bun 31.7; creat 0.93; k+ 5.0; na++ 141; liver normal albumin 4.0; tsh 1.52     Review of Systems Constitutional: Negative for malaise/fatigue.    Respiratory: no cough no shortness of breath  Cardiovascular: Negative for chest pain and leg swelling.  Gastrointestinal: Negative for  abdominal pain and constipation. No gerd  Musculoskeletal: Negative for myalgias negative for joint pain.  Skin: Negative.   Neurological: Negative for headaches.  Psychiatric/Behavioral: negative for depression     Physical Exam Constitutional: No distress.  Thin   Neck: Neck supple. No JVD present. No thyromegaly present.  Breast exam negative  Cardiovascular: Normal rate and intact distal pulses.   Heart rate irregular   Respiratory: lungs clear; no respiratory distress  GI: Soft. She exhibits no distension.  Musculoskeletal: She exhibits no edema.  Is able to move all extremities Has bilateral resting tremors present   Neurological: She is alert.  Skin: Skin is warm and dry. She is not diaphoretic.      ASSESSMENT/ PLAN:  1. Parkinson disease: no change in status will continue sinemet 25/100 mg four times daily and requip 5 mg daily and will monitor   2. Afib: heart rate is stable will continue lopressor 12.5 mg daily and cardizem 60 mg daily for rate control and asa 81 mg daily   3. Hypertension: is stable will continue lopressor 12.5 mg daily and cardizem 60 mg daily   4. Constipation: will continue senna s 2 tabs daily and daily prn and colace twice daily   5. Edema: is stable will continue lasix 20 mg daily  6. Major depression: she is followed by [facility]; will continue prozac 60 mg daily; lamictal 150 mg daily to help stabilize mood;  and seroquel 125 mg nightly will monitor  7. Insomnia: is without change : will continue melatonin 6 mg nightly; the remeron and belsomra have been stopped.   8. Left knee osteoarthritis: will continue voltaren gel 2 gm to left knee 4 times daily and tylenol 650 mg three times daily will monitor   9. Gerd: will continue  zantac 150 mg nightly     Ok Edwards NP [facility]   Contact [phone] Monday through Friday 8am- 5pm  After hours call [phone]

## 2015-12-16 ENCOUNTER — Non-Acute Institutional Stay (SKILLED_NURSING_FACILITY): Payer: Medicare Other | Admitting: Adult Health

## 2015-12-16 DIAGNOSIS — M1712 Unilateral primary osteoarthritis, left knee: Secondary | ICD-10-CM | POA: Diagnosis not present

## 2015-12-16 DIAGNOSIS — I482 Chronic atrial fibrillation, unspecified: Secondary | ICD-10-CM

## 2015-12-16 DIAGNOSIS — I120 Hypertensive chronic kidney disease with stage 5 chronic kidney disease or end stage renal disease: Secondary | ICD-10-CM | POA: Diagnosis not present

## 2015-12-16 DIAGNOSIS — F333 Major depressive disorder, recurrent, severe with psychotic symptoms: Secondary | ICD-10-CM | POA: Diagnosis not present

## 2015-12-16 DIAGNOSIS — R600 Localized edema: Secondary | ICD-10-CM

## 2015-12-16 DIAGNOSIS — R6889 Other general symptoms and signs: Secondary | ICD-10-CM | POA: Diagnosis not present

## 2015-12-16 DIAGNOSIS — G20 Parkinson's disease: Secondary | ICD-10-CM | POA: Diagnosis not present

## 2015-12-16 DIAGNOSIS — I10 Essential (primary) hypertension: Secondary | ICD-10-CM

## 2015-12-16 DIAGNOSIS — K5900 Constipation, unspecified: Secondary | ICD-10-CM

## 2015-12-16 DIAGNOSIS — K5909 Other constipation: Secondary | ICD-10-CM

## 2015-12-18 DIAGNOSIS — N390 Urinary tract infection, site not specified: Secondary | ICD-10-CM | POA: Diagnosis not present

## 2015-12-29 ENCOUNTER — Encounter: Payer: Self-pay | Admitting: Adult Health

## 2015-12-29 DIAGNOSIS — F333 Major depressive disorder, recurrent, severe with psychotic symptoms: Secondary | ICD-10-CM | POA: Insufficient documentation

## 2015-12-29 MED ORDER — QUETIAPINE FUMARATE 25 MG PO TABS: 12.5000 mg | ORAL_TABLET | Freq: Every day | ORAL | Status: DC

## 2015-12-29 NOTE — Progress Notes (Signed)
Patient ID: [name] [name], female   DOB: 18-Jul-1936, 80 y.o.   MRN: [id number]    Facility:  [facility]      Allergies  Allergen Reactions  . Penicillins     Chief Complaint  Patient presents with  . Medical Management of Chronic Issues    HPI:  She is a long term resident of this facility being seen for the management of her chronic illnesses. She is agitated today. She is having active visual and audial hallucinations with paranoid thoughts. She is denying any pain at this time. I'm not certain how long this has been going on. There are no report of any change in appetite or fevers.   Past Medical History  Diagnosis Date  . Vitamin D deficiency   . Edema   . Anemia   . Hypokalemia   . Depression   . Anxiety   . Parkinson disease ([place])   . Chronic pain   . Hypertension   . A-fib ([place])   . Constipation   . Chronic kidney disease   . UTI (lower urinary tract infection)   . Insomnia   . Intertrochanteric fracture ([place])   . Gait disturbance     Past Surgical History  Procedure Laterality Date  . Right hip surgery due to fracture      VITAL SIGNS BP 126/76 mmHg  Pulse 86  Ht 5\' 2"  (1.575 m)  Wt 133 lb (60.328 kg)  BMI 24.32 kg/m2  SpO2 98%  Patient's Medications  New Prescriptions   No medications on file  Previous Medications   ACETAMINOPHEN (TYLENOL) 325 MG TABLET    Take 650 mg by mouth 3 (three) times daily.   ANTISEPTIC ORAL RINSE (BIOTENE) LIQD    15 mLs by Mouth Rinse route 2 (two) times daily.   ASPIRIN 81 MG TABLET    Take 81 mg by mouth daily.   CARBIDOPA-LEVODOPA (SINEMET IR) 25-100 MG PER TABLET    Take 1 tablet by mouth 4 (four) times daily. For muscle spasms   CHOLECALCIFEROL (VITAMIN D) 1000 UNITS TABLET    Take 1,000 Units by mouth daily.   DEXTROMETHORPHAN 15 MG/5ML SYRUP    Take 10 mLs by mouth 4 (four) times daily as needed for cough.   DICLOFENAC SODIUM (VOLTAREN) 1 % GEL    Apply 2 g topically 4 (four) times daily. To knees   DILTIAZEM  (CARDIZEM) 60 MG TABLET    Take 60 mg by mouth daily.   DOCUSATE SODIUM (COLACE) 100 MG CAPSULE    Take 100 mg by mouth 2 (two) times daily.   FLUOXETINE (PROZAC) 20 MG CAPSULE    Take 60 mg by mouth daily. Take 3 tablets to equal 60 mg for anxiety.   FUROSEMIDE (LASIX) 20 MG TABLET    Take 20 mg by mouth daily.   LAMOTRIGINE (LAMICTAL) 150 MG TABLET    Take 150 mg by mouth daily.   MELATONIN 3 MG CAPS    Take 6 mg by mouth at bedtime.   METOPROLOL TARTRATE (LOPRESSOR) 25 MG TABLET    Take 12.5 mg by mouth daily.    MULTIPLE VITAMIN (DAILY VITE) TABS    Take by mouth daily. Take 1 tablet   OXYCODONE (ROXICODONE) 5 MG IMMEDIATE RELEASE TABLET    Take one tablet by mouth every 4 hours as needed for pain   QUETIAPINE (SEROQUEL) 100 MG TABLET    Take 125 mg by mouth at bedtime. For major depressive disorder  RANITIDINE (ZANTAC) 150 MG TABLET    Take 150 mg by mouth at bedtime.   ROPINIROLE (REQUIP) 5 MG TABLET    Take 5 mg by mouth at bedtime. For anxiety   SENNOSIDES-DOCUSATE SODIUM (SENOKOT-S) 8.6-50 MG TABLET    Take 2 tablets by mouth daily.  Modified Medications   No medications on file  Discontinued Medications   No medications on file     SIGNIFICANT DIAGNOSTIC EXAMS   03-26-15: left knee x-ray: modest osteoarthritis  03-26-15: left hip with pelvis x-ray; modest osteoarthritis left hip     LABS REVIEWED:   02-27-15: wbc 7.2; hgb 10.7; hct 34.5; mcv 90; plt 285 glucose 91; bun 18.9; creat 0.92; k+4.4; na++141; liver normal albumin 3.4  05-02-15: hgb a1c 5.6 05-16-15:urine culture: e-coli: cipro 07-07-15: wbc 8.4; hgb 11.5; hct 38.3; mcv 92.7; plt 324; glucose 104; bun 31.7; creat 0.93; k+ 5.0; na++ 141; liver normal albumin 4.0; tsh 1.52    Review of Systems  Unable to perform ROS: other       Physical Exam Constitutional: No distress.  Thin   Neck: Neck supple. No JVD present. No thyromegaly present.  Breast exam negative  Cardiovascular: Normal rate and intact distal  pulses.   Heart rate irregular   Respiratory: lungs clear; no respiratory distress  GI: Soft. She exhibits no distension.  Musculoskeletal: She exhibits no edema.  Is able to move all extremities Has bilateral resting tremors present   Neurological: She is alert.  Skin: Skin is warm and dry. She is not diaphoretic.      ASSESSMENT/ PLAN:  1. Parkinson disease: no change in status will continue sinemet 25/100 mg four times daily and requip 5 mg daily and will monitor   2. Afib: heart rate is stable will continue lopressor 12.5 mg daily and cardizem 60 mg daily for rate control and asa 81 mg daily   3. Hypertension: is stable will continue lopressor 12.5 mg daily and cardizem 60 mg daily   4. Constipation: will continue senna s 2 tabs daily and daily prn and colace twice daily   5. Edema: is stable will continue lasix 20 mg daily  6. Major depression: she is followed by [facility]; will continue prozac 60 mg daily; lamictal 150 mg daily to help stabilize mood;  Will change her seroquel to 12.5 mg in the AM and 125 mg in the PM will monitor  7. Insomnia: is without change : will continue melatonin 6 mg nightly; the remeron and belsomra have been stopped.   8. Left knee osteoarthritis: will continue voltaren gel 2 gm to left knee 4 times daily and tylenol 650 mg three times daily will monitor    Will get ua/c&s Will get cbc; cmp        Ok Edwards NP [facility]  Contact [phone] Monday through Friday 8am- 5pm  After hours call [phone]

## 2016-01-01 DIAGNOSIS — B353 Tinea pedis: Secondary | ICD-10-CM | POA: Diagnosis not present

## 2016-01-01 DIAGNOSIS — M79674 Pain in right toe(s): Secondary | ICD-10-CM | POA: Diagnosis not present

## 2016-01-01 DIAGNOSIS — E1159 Type 2 diabetes mellitus with other circulatory complications: Secondary | ICD-10-CM | POA: Diagnosis not present

## 2016-01-01 DIAGNOSIS — M79675 Pain in left toe(s): Secondary | ICD-10-CM | POA: Diagnosis not present

## 2016-01-01 DIAGNOSIS — B351 Tinea unguium: Secondary | ICD-10-CM | POA: Diagnosis not present

## 2016-01-20 DIAGNOSIS — G218 Other secondary parkinsonism: Secondary | ICD-10-CM | POA: Diagnosis not present

## 2016-01-20 DIAGNOSIS — E559 Vitamin D deficiency, unspecified: Secondary | ICD-10-CM | POA: Diagnosis not present

## 2016-01-20 DIAGNOSIS — M179 Osteoarthritis of knee, unspecified: Secondary | ICD-10-CM | POA: Diagnosis not present

## 2016-01-21 ENCOUNTER — Non-Acute Institutional Stay (SKILLED_NURSING_FACILITY): Payer: Medicare Other | Admitting: Adult Health

## 2016-01-21 DIAGNOSIS — I10 Essential (primary) hypertension: Secondary | ICD-10-CM | POA: Diagnosis not present

## 2016-01-21 DIAGNOSIS — I482 Chronic atrial fibrillation, unspecified: Secondary | ICD-10-CM

## 2016-01-21 DIAGNOSIS — F333 Major depressive disorder, recurrent, severe with psychotic symptoms: Secondary | ICD-10-CM

## 2016-01-21 DIAGNOSIS — M179 Osteoarthritis of knee, unspecified: Secondary | ICD-10-CM | POA: Diagnosis not present

## 2016-01-21 DIAGNOSIS — M1712 Unilateral primary osteoarthritis, left knee: Secondary | ICD-10-CM

## 2016-01-21 DIAGNOSIS — G20 Parkinson's disease: Secondary | ICD-10-CM | POA: Diagnosis not present

## 2016-01-21 DIAGNOSIS — G218 Other secondary parkinsonism: Secondary | ICD-10-CM | POA: Diagnosis not present

## 2016-01-21 DIAGNOSIS — R600 Localized edema: Secondary | ICD-10-CM | POA: Diagnosis not present

## 2016-01-21 DIAGNOSIS — E559 Vitamin D deficiency, unspecified: Secondary | ICD-10-CM | POA: Diagnosis not present

## 2016-01-22 DIAGNOSIS — E559 Vitamin D deficiency, unspecified: Secondary | ICD-10-CM | POA: Diagnosis not present

## 2016-01-22 DIAGNOSIS — I120 Hypertensive chronic kidney disease with stage 5 chronic kidney disease or end stage renal disease: Secondary | ICD-10-CM | POA: Diagnosis not present

## 2016-01-22 DIAGNOSIS — I4891 Unspecified atrial fibrillation: Secondary | ICD-10-CM | POA: Diagnosis not present

## 2016-01-22 DIAGNOSIS — G218 Other secondary parkinsonism: Secondary | ICD-10-CM | POA: Diagnosis not present

## 2016-01-22 DIAGNOSIS — M179 Osteoarthritis of knee, unspecified: Secondary | ICD-10-CM | POA: Diagnosis not present

## 2016-01-23 DIAGNOSIS — M179 Osteoarthritis of knee, unspecified: Secondary | ICD-10-CM | POA: Diagnosis not present

## 2016-01-23 DIAGNOSIS — E559 Vitamin D deficiency, unspecified: Secondary | ICD-10-CM | POA: Diagnosis not present

## 2016-01-23 DIAGNOSIS — G218 Other secondary parkinsonism: Secondary | ICD-10-CM | POA: Diagnosis not present

## 2016-01-25 DIAGNOSIS — E559 Vitamin D deficiency, unspecified: Secondary | ICD-10-CM | POA: Diagnosis not present

## 2016-01-25 DIAGNOSIS — G218 Other secondary parkinsonism: Secondary | ICD-10-CM | POA: Diagnosis not present

## 2016-01-25 DIAGNOSIS — M179 Osteoarthritis of knee, unspecified: Secondary | ICD-10-CM | POA: Diagnosis not present

## 2016-01-26 DIAGNOSIS — E559 Vitamin D deficiency, unspecified: Secondary | ICD-10-CM | POA: Diagnosis not present

## 2016-01-26 DIAGNOSIS — G218 Other secondary parkinsonism: Secondary | ICD-10-CM | POA: Diagnosis not present

## 2016-01-26 DIAGNOSIS — M179 Osteoarthritis of knee, unspecified: Secondary | ICD-10-CM | POA: Diagnosis not present

## 2016-01-27 DIAGNOSIS — E559 Vitamin D deficiency, unspecified: Secondary | ICD-10-CM | POA: Diagnosis not present

## 2016-01-27 DIAGNOSIS — M179 Osteoarthritis of knee, unspecified: Secondary | ICD-10-CM | POA: Diagnosis not present

## 2016-01-27 DIAGNOSIS — G218 Other secondary parkinsonism: Secondary | ICD-10-CM | POA: Diagnosis not present

## 2016-01-28 DIAGNOSIS — R279 Unspecified lack of coordination: Secondary | ICD-10-CM | POA: Diagnosis not present

## 2016-01-28 DIAGNOSIS — M6281 Muscle weakness (generalized): Secondary | ICD-10-CM | POA: Diagnosis not present

## 2016-01-28 DIAGNOSIS — R1311 Dysphagia, oral phase: Secondary | ICD-10-CM | POA: Diagnosis not present

## 2016-01-28 DIAGNOSIS — G20 Parkinson's disease: Secondary | ICD-10-CM | POA: Diagnosis not present

## 2016-01-30 DIAGNOSIS — G20 Parkinson's disease: Secondary | ICD-10-CM | POA: Diagnosis not present

## 2016-01-30 DIAGNOSIS — M6281 Muscle weakness (generalized): Secondary | ICD-10-CM | POA: Diagnosis not present

## 2016-01-30 DIAGNOSIS — R279 Unspecified lack of coordination: Secondary | ICD-10-CM | POA: Diagnosis not present

## 2016-01-30 DIAGNOSIS — R1311 Dysphagia, oral phase: Secondary | ICD-10-CM | POA: Diagnosis not present

## 2016-02-01 DIAGNOSIS — R279 Unspecified lack of coordination: Secondary | ICD-10-CM | POA: Diagnosis not present

## 2016-02-01 DIAGNOSIS — R1311 Dysphagia, oral phase: Secondary | ICD-10-CM | POA: Diagnosis not present

## 2016-02-01 DIAGNOSIS — G20 Parkinson's disease: Secondary | ICD-10-CM | POA: Diagnosis not present

## 2016-02-01 DIAGNOSIS — M6281 Muscle weakness (generalized): Secondary | ICD-10-CM | POA: Diagnosis not present

## 2016-02-02 DIAGNOSIS — G20 Parkinson's disease: Secondary | ICD-10-CM | POA: Diagnosis not present

## 2016-02-02 DIAGNOSIS — R279 Unspecified lack of coordination: Secondary | ICD-10-CM | POA: Diagnosis not present

## 2016-02-02 DIAGNOSIS — M6281 Muscle weakness (generalized): Secondary | ICD-10-CM | POA: Diagnosis not present

## 2016-02-02 DIAGNOSIS — R1311 Dysphagia, oral phase: Secondary | ICD-10-CM | POA: Diagnosis not present

## 2016-02-03 DIAGNOSIS — M6281 Muscle weakness (generalized): Secondary | ICD-10-CM | POA: Diagnosis not present

## 2016-02-03 DIAGNOSIS — R1311 Dysphagia, oral phase: Secondary | ICD-10-CM | POA: Diagnosis not present

## 2016-02-03 DIAGNOSIS — G20 Parkinson's disease: Secondary | ICD-10-CM | POA: Diagnosis not present

## 2016-02-03 DIAGNOSIS — R279 Unspecified lack of coordination: Secondary | ICD-10-CM | POA: Diagnosis not present

## 2016-02-04 DIAGNOSIS — R279 Unspecified lack of coordination: Secondary | ICD-10-CM | POA: Diagnosis not present

## 2016-02-04 DIAGNOSIS — G20 Parkinson's disease: Secondary | ICD-10-CM | POA: Diagnosis not present

## 2016-02-04 DIAGNOSIS — R1311 Dysphagia, oral phase: Secondary | ICD-10-CM | POA: Diagnosis not present

## 2016-02-04 DIAGNOSIS — M6281 Muscle weakness (generalized): Secondary | ICD-10-CM | POA: Diagnosis not present

## 2016-02-05 DIAGNOSIS — R1311 Dysphagia, oral phase: Secondary | ICD-10-CM | POA: Diagnosis not present

## 2016-02-05 DIAGNOSIS — G20 Parkinson's disease: Secondary | ICD-10-CM | POA: Diagnosis not present

## 2016-02-05 DIAGNOSIS — R279 Unspecified lack of coordination: Secondary | ICD-10-CM | POA: Diagnosis not present

## 2016-02-05 DIAGNOSIS — M6281 Muscle weakness (generalized): Secondary | ICD-10-CM | POA: Diagnosis not present

## 2016-02-08 DIAGNOSIS — R1311 Dysphagia, oral phase: Secondary | ICD-10-CM | POA: Diagnosis not present

## 2016-02-08 DIAGNOSIS — G20 Parkinson's disease: Secondary | ICD-10-CM | POA: Diagnosis not present

## 2016-02-08 DIAGNOSIS — R279 Unspecified lack of coordination: Secondary | ICD-10-CM | POA: Diagnosis not present

## 2016-02-08 DIAGNOSIS — M6281 Muscle weakness (generalized): Secondary | ICD-10-CM | POA: Diagnosis not present

## 2016-02-09 DIAGNOSIS — R279 Unspecified lack of coordination: Secondary | ICD-10-CM | POA: Diagnosis not present

## 2016-02-09 DIAGNOSIS — R1311 Dysphagia, oral phase: Secondary | ICD-10-CM | POA: Diagnosis not present

## 2016-02-09 DIAGNOSIS — M6281 Muscle weakness (generalized): Secondary | ICD-10-CM | POA: Diagnosis not present

## 2016-02-09 DIAGNOSIS — G20 Parkinson's disease: Secondary | ICD-10-CM | POA: Diagnosis not present

## 2016-02-10 ENCOUNTER — Encounter: Payer: Self-pay | Admitting: Adult Health

## 2016-02-10 DIAGNOSIS — M6281 Muscle weakness (generalized): Secondary | ICD-10-CM | POA: Diagnosis not present

## 2016-02-10 DIAGNOSIS — G20 Parkinson's disease: Secondary | ICD-10-CM | POA: Diagnosis not present

## 2016-02-10 DIAGNOSIS — R1311 Dysphagia, oral phase: Secondary | ICD-10-CM | POA: Diagnosis not present

## 2016-02-10 DIAGNOSIS — R279 Unspecified lack of coordination: Secondary | ICD-10-CM | POA: Diagnosis not present

## 2016-02-10 MED ORDER — DICLOFENAC SODIUM 1 % TD GEL: 4.0000 g | Freq: Four times a day (QID) | TRANSDERMAL | Status: DC

## 2016-02-10 NOTE — Progress Notes (Signed)
Patient ID: [name] [name], female   DOB: August 07, 1936, 80 y.o.   MRN: [id number]    Facility:  [facility]       Allergies  Allergen Reactions  . Penicillins     Chief Complaint  Patient presents with  . Medical Management of Chronic Issues    HPI:  She is a long term resident of this facility being seen for the management of her chronic illnesses. Overall there is little change in her status. She tells me that the voltaren gel works; but not well enough and would like a higher dose if possible. There are no nursing concerns at this time.    Past Medical History  Diagnosis Date  . Vitamin D deficiency   . Edema   . Anemia   . Hypokalemia   . Depression   . Anxiety   . Parkinson disease ([place])   . Chronic pain   . Hypertension   . A-fib ([place])   . Constipation   . Chronic kidney disease   . UTI (lower urinary tract infection)   . Insomnia   . Intertrochanteric fracture ([place])   . Gait disturbance     Past Surgical History  Procedure Laterality Date  . Right hip surgery due to fracture      VITAL SIGNS BP 112/56 mmHg  Pulse 92  Temp(Src) 98.1 F (36.7 C)  Ht 5\' 2"  (1.575 m)  Wt 133 lb (60.328 kg)  BMI 24.32 kg/m2  SpO2 99%  Patient's Medications  New Prescriptions   No medications on file  Previous Medications   ACETAMINOPHEN (TYLENOL) 325 MG TABLET    Take 650 mg by mouth 3 (three) times daily.   ANTISEPTIC ORAL RINSE (BIOTENE) LIQD    15 mLs by Mouth Rinse route 2 (two) times daily.   ASPIRIN 81 MG TABLET    Take 81 mg by mouth daily.   CARBIDOPA-LEVODOPA (SINEMET IR) 25-100 MG PER TABLET    Take 1 tablet by mouth 4 (four) times daily. For muscle spasms   CHOLECALCIFEROL (VITAMIN D) 1000 UNITS TABLET    Take 1,000 Units by mouth daily.   DEXTROMETHORPHAN 15 MG/5ML SYRUP    Take 10 mLs by mouth 4 (four) times daily as needed for cough.   DICLOFENAC SODIUM (VOLTAREN) 1 % GEL    Apply 2 g topically 4 (four) times daily. To knees   DILTIAZEM (CARDIZEM) 60 MG  TABLET    Take 60 mg by mouth daily.   DOCUSATE SODIUM (COLACE) 100 MG CAPSULE    Take 100 mg by mouth 2 (two) times daily.   FLUOXETINE (PROZAC) 20 MG CAPSULE    Take 60 mg by mouth daily. Take 3 tablets to equal 60 mg for anxiety.   FUROSEMIDE (LASIX) 20 MG TABLET    Take 20 mg by mouth daily.   LAMOTRIGINE (LAMICTAL) 150 MG TABLET    Take 150 mg by mouth daily.   MELATONIN 3 MG CAPS    Take 6 mg by mouth at bedtime.   METOPROLOL TARTRATE (LOPRESSOR) 25 MG TABLET    Take 12.5 mg by mouth daily.    MULTIPLE VITAMIN (DAILY VITE) TABS    Take by mouth daily. Take 1 tablet   QUETIAPINE (SEROQUEL) 100 MG TABLET    Take 125 mg by mouth at bedtime. For major depressive disorder   QUETIAPINE (SEROQUEL) 25 MG TABLET    Take 0.5 tablets (12.5 mg total) by mouth at bedtime.   RANITIDINE (ZANTAC) 150 MG  TABLET    Take 150 mg by mouth at bedtime.   ROPINIROLE (REQUIP) 5 MG TABLET    Take 5 mg by mouth at bedtime. For anxiety   SENNOSIDES-DOCUSATE SODIUM (SENOKOT-S) 8.6-50 MG TABLET    Take 2 tablets by mouth daily.  Modified Medications   No medications on file  Discontinued Medications     SIGNIFICANT DIAGNOSTIC EXAMS    03-26-15: left knee x-ray: modest osteoarthritis  03-26-15: left hip with pelvis x-ray; modest osteoarthritis left hip     LABS REVIEWED:   02-27-15: wbc 7.2; hgb 10.7; hct 34.5; mcv 90; plt 285 glucose 91; bun 18.9; creat 0.92; k+4.4; na++141; liver normal albumin 3.4  05-02-15: hgb a1c 5.6 05-16-15:urine culture: e-coli: cipro 07-07-15: wbc 8.4; hgb 11.5; hct 38.3; mcv 92.7; plt 324; glucose 104; bun 31.7; creat 0.93; k+ 5.0; na++ 141; liver normal albumin 4.0; tsh 1.52  12-18-15: urine culture: e-coli: cipro    Review of Systems  Unable to perform ROS: other       Physical Exam Constitutional: No distress.  Thin   Neck: Neck supple. No JVD present. No thyromegaly present.  Breast exam negative  Cardiovascular: Normal rate and intact distal pulses.   Heart rate  irregular   Respiratory: lungs clear; no respiratory distress  GI: Soft. She exhibits no distension.  Musculoskeletal: She exhibits no edema.  Is able to move all extremities Has bilateral resting tremors present   Neurological: She is alert.  Skin: Skin is warm and dry. She is not diaphoretic.      ASSESSMENT/ PLAN:  1. Parkinson disease: no change in status will continue sinemet 25/100 mg four times daily and requip 5 mg daily and will monitor   2. Afib: heart rate is stable will continue lopressor 12.5 mg daily and cardizem 60 mg daily for rate control and asa 81 mg daily   3. Hypertension: is stable will continue lopressor 12.5 mg daily and cardizem 60 mg daily   4. Constipation: will continue senna s 2 tabs daily  and colace twice daily   5. Edema: is stable will continue lasix 20 mg daily  6. Major depression: she is followed by [facility]; will continue prozac 60 mg daily; lamictal 150 mg daily to help stabilize mood;  Will continue seroquel  12.5 mg in the AM and 125 mg in the PM will monitor  7. Insomnia: is without change : will continue melatonin 6 mg nightly; the remeron and belsomra have been stopped.   8. Left knee osteoarthritis: will continue  tylenol 650 mg three times daily  And will increase voltaren gel 4 gm four times daily for her left knee  will monitor          Ok Edwards NP [facility]  Contact [phone] Monday through Friday 8am- 5pm  After hours call [phone]

## 2016-02-11 DIAGNOSIS — R279 Unspecified lack of coordination: Secondary | ICD-10-CM | POA: Diagnosis not present

## 2016-02-11 DIAGNOSIS — G20 Parkinson's disease: Secondary | ICD-10-CM | POA: Diagnosis not present

## 2016-02-11 DIAGNOSIS — M6281 Muscle weakness (generalized): Secondary | ICD-10-CM | POA: Diagnosis not present

## 2016-02-11 DIAGNOSIS — R1311 Dysphagia, oral phase: Secondary | ICD-10-CM | POA: Diagnosis not present

## 2016-02-12 ENCOUNTER — Encounter: Payer: Self-pay | Admitting: Internal Medicine

## 2016-02-12 ENCOUNTER — Non-Acute Institutional Stay (SKILLED_NURSING_FACILITY): Payer: Medicare Other | Admitting: Internal Medicine

## 2016-02-12 DIAGNOSIS — R1311 Dysphagia, oral phase: Secondary | ICD-10-CM | POA: Diagnosis not present

## 2016-02-12 DIAGNOSIS — I482 Chronic atrial fibrillation, unspecified: Secondary | ICD-10-CM

## 2016-02-12 DIAGNOSIS — G20 Parkinson's disease: Secondary | ICD-10-CM

## 2016-02-12 DIAGNOSIS — I10 Essential (primary) hypertension: Secondary | ICD-10-CM | POA: Diagnosis not present

## 2016-02-12 DIAGNOSIS — M6281 Muscle weakness (generalized): Secondary | ICD-10-CM | POA: Diagnosis not present

## 2016-02-12 DIAGNOSIS — R279 Unspecified lack of coordination: Secondary | ICD-10-CM | POA: Diagnosis not present

## 2016-02-12 NOTE — Assessment & Plan Note (Signed)
Stable on cardizem, 60 mg daily,  Lasix 20 mg daily and lopressor 12.5 mg daily;plan - cont current meds

## 2016-02-12 NOTE — Assessment & Plan Note (Signed)
Stable will continue lopressor 12.5 mg daily and cardizem 60 mg daily for rate control and asa 81 mg daily as prophylaxis

## 2016-02-12 NOTE — Assessment & Plan Note (Signed)
Chronic, no change in status will continue sinemet 25/100 mg four times daily and requip 5 mg daily and will monitor

## 2016-02-12 NOTE — Progress Notes (Signed)
MRN: [id number] Name: [name] [name]  Sex: female Age: 80 y.o. DOB: 1936-09-08  [place] #: Karren Burly Facility/Room:221 Level Of Care: SNF Provider: Inocencio Homes D Emergency Contacts: Extended Emergency Contact Information Primary Emergency Contact: Winward,Bobby E Address: [address] [place] [place], [place] [address] [place] Phone: [phone] Relation: Spouse Secondary Emergency Contact: Way,Cheryl Address: [address] [place] [address] [place] Phone: [phone] Relation: Daughter  Code Status:   Allergies: Penicillins  Chief Complaint  Patient presents with  . Medical Management of Chronic Issues    HPI: Patient is 80 y.o. female who is being seen for routine issues of HTN, AF and parkinson's dx.  Past Medical History  Diagnosis Date  . Vitamin D deficiency   . Edema   . Anemia   . Hypokalemia   . Depression   . Anxiety   . Parkinson disease ([place])   . Chronic pain   . Hypertension   . A-fib ([place])   . Constipation   . Chronic kidney disease   . UTI (lower urinary tract infection)   . Insomnia   . Intertrochanteric fracture ([place])   . Gait disturbance     Past Surgical History  Procedure Laterality Date  . Right hip surgery due to fracture        Medication List       This list is accurate as of: 02/12/16 10:18 PM.  Always use your most recent med list.               acetaminophen 325 MG tablet  Commonly known as:  TYLENOL  Take 650 mg by mouth 3 (three) times daily.     antiseptic oral rinse Liqd  15 mLs by Mouth Rinse route 2 (two) times daily.     aspirin 81 MG tablet  Take 81 mg by mouth daily.     carbidopa-levodopa 25-100 MG tablet  Commonly known as:  SINEMET IR  Take 1 tablet by mouth 4 (four) times daily. For muscle spasms     cholecalciferol 1000 units tablet  Commonly known as:  VITAMIN D  Take 1,000 Units by mouth daily.     DAILY VITE Tabs  Take by mouth  daily. Take 1 tablet     dextromethorphan 15 MG/5ML syrup  Take 10 mLs by mouth 4 (four) times daily as needed for cough.     diclofenac sodium 1 % Gel  Commonly known as:  VOLTAREN  Apply 4 g topically 4 (four) times daily. To knees     diltiazem 60 MG tablet  Commonly known as:  CARDIZEM  Take 60 mg by mouth daily.     docusate sodium 100 MG capsule  Commonly known as:  COLACE  Take 100 mg by mouth 2 (two) times daily.     FLUoxetine 20 MG capsule  Commonly known as:  PROZAC  Take 60 mg by mouth daily. Take 3 tablets to equal 60 mg for anxiety.     furosemide 20 MG tablet  Commonly known as:  LASIX  Take 20 mg by mouth daily.     lamoTRIgine 150 MG tablet  Commonly known as:  LAMICTAL  Take 150 mg by mouth daily.     Melatonin 3 MG Caps  Take 6 mg by mouth at bedtime.     metoprolol tartrate 25 MG tablet  Commonly known as:  LOPRESSOR  Take 12.5 mg by mouth daily.     QUEtiapine 100 MG tablet  Commonly known as:  SEROQUEL  Take 125 mg by mouth at bedtime. For major depressive disorder     QUEtiapine 25 MG tablet  Commonly known as:  SEROQUEL  Take 0.5 tablets (12.5 mg total) by mouth at bedtime.     ranitidine 150 MG tablet  Commonly known as:  ZANTAC  Take 150 mg by mouth at bedtime.     ropinirole 5 MG tablet  Commonly known as:  REQUIP  Take 5 mg by mouth at bedtime. For anxiety     sennosides-docusate sodium 8.6-50 MG tablet  Commonly known as:  SENOKOT-S  Take 2 tablets by mouth daily.        No orders of the defined types were placed in this encounter.    Immunization History  Administered Date(s) Administered  . Influenza-Unspecified 04/28/2013, 10/09/2014  . PPD Test 03/16/2011    Social History  Substance Use Topics  . Smoking status: Never Smoker   . Smokeless tobacco: Never Used  . Alcohol Use: No    Review of Systems  Pt is not really reliable;she says sheis sleeping better;nursing without concerns    Filed Vitals:    02/12/16 1409  BP: 140/64  Pulse: 83  Temp: 96.1 F (35.6 C)  Resp: 18    Physical Exam  GENERAL APPEARANCE: Alert, mod conversant, No acute distress  SKIN: No diaphoresis rash HEENT: Unremarkable RESPIRATORY: Breathing is even, unlabored. Lung sounds are clear   CARDIOVASCULAR: Heart RRR no murmurs, rubs or gallops. No peripheral edema  GASTROINTESTINAL: Abdomen is soft, non-tender, not distended w/ normal bowel sounds.  GENITOURINARY: Bladder non tender, not distended  MUSCULOSKELETAL: No abnormal joints or musculature NEUROLOGIC: Cranial nerves 2-12 grossly intact. Moves all extremities; BUE tremor not particularly characteristic PSYCHIATRIC: Mood and affect appropriate to situation wirh dementia, no behavioral issues  Patient Active Problem List   Diagnosis Date Noted  . Depression, major, recurrent, severe with psychosis ([place]) 12/29/2015  . Chronic a-fib ([place]) 11/13/2015  . Chronic constipation 11/13/2015  . Bilateral lower extremity edema 11/13/2015  . Osteoarthritis of left knee 05/22/2015  . Anemia due to other cause 02/26/2015  . Insomnia 12/31/2014  . Enchondroma of femur 02/21/2014  . Vitamin D deficiency 04/17/2013  . Major depression ([place]) 04/17/2013  . Parkinson disease ([place]) 04/17/2013  . Essential hypertension, benign 04/17/2013  . Tremor, essential 04/17/2013    CBC    Component Value Date/Time   WBC 10.0 12/02/2014   WBC 6.3 11/02/2010 0520   RBC 3.29* 11/02/2010 0520   HGB 9.9* 12/02/2014   HCT 33* 12/02/2014   PLT 428* 12/02/2014   MCV 91.6 11/02/2010 0520   LYMPHSABS 1.6 10/24/2010 0902   MONOABS 0.9 10/24/2010 0902   EOSABS 0.0 10/24/2010 0902   BASOSABS 0.0 10/24/2010 0902    CMP     Component Value Date/Time   NA 144 04/18/2014   NA 140 03/03/2011 0443   K 4.6 04/18/2014   CL 103 03/03/2011 0443   CO2 28 11/02/2010 0520   GLUCOSE 109* 03/03/2011 0443   BUN 23* 04/18/2014   BUN 19 03/03/2011 0443   CREATININE 0.7 04/18/2014    CREATININE 0.8 03/03/2011 0443   CALCIUM 8.8 11/02/2010 0520   PROT 6.7 10/22/2010 1933   ALBUMIN 3.6 10/22/2010 1933   AST 16 10/22/2010 1933   ALT 10 10/22/2010 1933   ALKPHOS 80 10/22/2010  1933   BILITOT 0.5 10/22/2010 1933   GFRNONAA >60 11/02/2010 0520   GFRAA  11/02/2010 0520    >60        The eGFR has been calculated using the MDRD equation. This calculation has not been validated in all clinical situations. eGFR's persistently <60 mL/min signify possible Chronic Kidney Disease.    Assessment and Plan  Essential hypertension, benign Stable on cardizem, 60 mg daily,  Lasix 20 mg daily and lopressor 12.5 mg daily;plan - cont current meds  Chronic a-fib ([facility]) Stable will continue lopressor 12.5 mg daily and cardizem 60 mg daily for rate control and asa 81 mg daily as prophylaxis   Parkinson disease Chronic, no change in status will continue sinemet 25/100 mg four times daily and requip 5 mg daily and will monitor     Hennie Duos, MD

## 2016-02-13 DIAGNOSIS — R279 Unspecified lack of coordination: Secondary | ICD-10-CM | POA: Diagnosis not present

## 2016-02-13 DIAGNOSIS — R1311 Dysphagia, oral phase: Secondary | ICD-10-CM | POA: Diagnosis not present

## 2016-02-13 DIAGNOSIS — M6281 Muscle weakness (generalized): Secondary | ICD-10-CM | POA: Diagnosis not present

## 2016-02-13 DIAGNOSIS — G20 Parkinson's disease: Secondary | ICD-10-CM | POA: Diagnosis not present

## 2016-02-15 DIAGNOSIS — R279 Unspecified lack of coordination: Secondary | ICD-10-CM | POA: Diagnosis not present

## 2016-02-15 DIAGNOSIS — M6281 Muscle weakness (generalized): Secondary | ICD-10-CM | POA: Diagnosis not present

## 2016-02-15 DIAGNOSIS — G20 Parkinson's disease: Secondary | ICD-10-CM | POA: Diagnosis not present

## 2016-02-15 DIAGNOSIS — R1311 Dysphagia, oral phase: Secondary | ICD-10-CM | POA: Diagnosis not present

## 2016-02-16 DIAGNOSIS — R1311 Dysphagia, oral phase: Secondary | ICD-10-CM | POA: Diagnosis not present

## 2016-02-16 DIAGNOSIS — R279 Unspecified lack of coordination: Secondary | ICD-10-CM | POA: Diagnosis not present

## 2016-02-16 DIAGNOSIS — G20 Parkinson's disease: Secondary | ICD-10-CM | POA: Diagnosis not present

## 2016-02-16 DIAGNOSIS — M6281 Muscle weakness (generalized): Secondary | ICD-10-CM | POA: Diagnosis not present

## 2016-02-17 DIAGNOSIS — R279 Unspecified lack of coordination: Secondary | ICD-10-CM | POA: Diagnosis not present

## 2016-02-17 DIAGNOSIS — G20 Parkinson's disease: Secondary | ICD-10-CM | POA: Diagnosis not present

## 2016-02-17 DIAGNOSIS — R1311 Dysphagia, oral phase: Secondary | ICD-10-CM | POA: Diagnosis not present

## 2016-02-17 DIAGNOSIS — M6281 Muscle weakness (generalized): Secondary | ICD-10-CM | POA: Diagnosis not present

## 2016-02-18 DIAGNOSIS — G20 Parkinson's disease: Secondary | ICD-10-CM | POA: Diagnosis not present

## 2016-02-18 DIAGNOSIS — R279 Unspecified lack of coordination: Secondary | ICD-10-CM | POA: Diagnosis not present

## 2016-02-18 DIAGNOSIS — R1311 Dysphagia, oral phase: Secondary | ICD-10-CM | POA: Diagnosis not present

## 2016-02-18 DIAGNOSIS — M6281 Muscle weakness (generalized): Secondary | ICD-10-CM | POA: Diagnosis not present

## 2016-02-19 DIAGNOSIS — G20 Parkinson's disease: Secondary | ICD-10-CM | POA: Diagnosis not present

## 2016-02-19 DIAGNOSIS — M6281 Muscle weakness (generalized): Secondary | ICD-10-CM | POA: Diagnosis not present

## 2016-02-19 DIAGNOSIS — R1311 Dysphagia, oral phase: Secondary | ICD-10-CM | POA: Diagnosis not present

## 2016-02-19 DIAGNOSIS — R279 Unspecified lack of coordination: Secondary | ICD-10-CM | POA: Diagnosis not present

## 2016-02-20 DIAGNOSIS — R1311 Dysphagia, oral phase: Secondary | ICD-10-CM | POA: Diagnosis not present

## 2016-02-20 DIAGNOSIS — R279 Unspecified lack of coordination: Secondary | ICD-10-CM | POA: Diagnosis not present

## 2016-02-20 DIAGNOSIS — G20 Parkinson's disease: Secondary | ICD-10-CM | POA: Diagnosis not present

## 2016-02-20 DIAGNOSIS — M6281 Muscle weakness (generalized): Secondary | ICD-10-CM | POA: Diagnosis not present

## 2016-02-23 DIAGNOSIS — R279 Unspecified lack of coordination: Secondary | ICD-10-CM | POA: Diagnosis not present

## 2016-02-23 DIAGNOSIS — G20 Parkinson's disease: Secondary | ICD-10-CM | POA: Diagnosis not present

## 2016-02-23 DIAGNOSIS — R1311 Dysphagia, oral phase: Secondary | ICD-10-CM | POA: Diagnosis not present

## 2016-02-23 DIAGNOSIS — M6281 Muscle weakness (generalized): Secondary | ICD-10-CM | POA: Diagnosis not present

## 2016-02-24 DIAGNOSIS — G20 Parkinson's disease: Secondary | ICD-10-CM | POA: Diagnosis not present

## 2016-02-24 DIAGNOSIS — R279 Unspecified lack of coordination: Secondary | ICD-10-CM | POA: Diagnosis not present

## 2016-02-24 DIAGNOSIS — R1311 Dysphagia, oral phase: Secondary | ICD-10-CM | POA: Diagnosis not present

## 2016-02-24 DIAGNOSIS — M6281 Muscle weakness (generalized): Secondary | ICD-10-CM | POA: Diagnosis not present

## 2016-02-25 DIAGNOSIS — G20 Parkinson's disease: Secondary | ICD-10-CM | POA: Diagnosis not present

## 2016-02-25 DIAGNOSIS — R279 Unspecified lack of coordination: Secondary | ICD-10-CM | POA: Diagnosis not present

## 2016-02-25 DIAGNOSIS — M6281 Muscle weakness (generalized): Secondary | ICD-10-CM | POA: Diagnosis not present

## 2016-02-25 DIAGNOSIS — R1311 Dysphagia, oral phase: Secondary | ICD-10-CM | POA: Diagnosis not present

## 2016-02-26 DIAGNOSIS — R1311 Dysphagia, oral phase: Secondary | ICD-10-CM | POA: Diagnosis not present

## 2016-02-26 DIAGNOSIS — G20 Parkinson's disease: Secondary | ICD-10-CM | POA: Diagnosis not present

## 2016-02-26 DIAGNOSIS — M6281 Muscle weakness (generalized): Secondary | ICD-10-CM | POA: Diagnosis not present

## 2016-02-26 DIAGNOSIS — R279 Unspecified lack of coordination: Secondary | ICD-10-CM | POA: Diagnosis not present

## 2016-02-27 DIAGNOSIS — G20 Parkinson's disease: Secondary | ICD-10-CM | POA: Diagnosis not present

## 2016-02-27 DIAGNOSIS — R279 Unspecified lack of coordination: Secondary | ICD-10-CM | POA: Diagnosis not present

## 2016-02-27 DIAGNOSIS — M6281 Muscle weakness (generalized): Secondary | ICD-10-CM | POA: Diagnosis not present

## 2016-02-27 DIAGNOSIS — R1311 Dysphagia, oral phase: Secondary | ICD-10-CM | POA: Diagnosis not present

## 2016-02-28 DIAGNOSIS — G20 Parkinson's disease: Secondary | ICD-10-CM | POA: Diagnosis not present

## 2016-02-28 DIAGNOSIS — R279 Unspecified lack of coordination: Secondary | ICD-10-CM | POA: Diagnosis not present

## 2016-02-28 DIAGNOSIS — M6281 Muscle weakness (generalized): Secondary | ICD-10-CM | POA: Diagnosis not present

## 2016-02-28 DIAGNOSIS — R1311 Dysphagia, oral phase: Secondary | ICD-10-CM | POA: Diagnosis not present

## 2016-02-29 DIAGNOSIS — R1311 Dysphagia, oral phase: Secondary | ICD-10-CM | POA: Diagnosis not present

## 2016-02-29 DIAGNOSIS — G20 Parkinson's disease: Secondary | ICD-10-CM | POA: Diagnosis not present

## 2016-02-29 DIAGNOSIS — R279 Unspecified lack of coordination: Secondary | ICD-10-CM | POA: Diagnosis not present

## 2016-02-29 DIAGNOSIS — M6281 Muscle weakness (generalized): Secondary | ICD-10-CM | POA: Diagnosis not present

## 2016-03-01 DIAGNOSIS — R1311 Dysphagia, oral phase: Secondary | ICD-10-CM | POA: Diagnosis not present

## 2016-03-01 DIAGNOSIS — M6281 Muscle weakness (generalized): Secondary | ICD-10-CM | POA: Diagnosis not present

## 2016-03-01 DIAGNOSIS — G20 Parkinson's disease: Secondary | ICD-10-CM | POA: Diagnosis not present

## 2016-03-01 DIAGNOSIS — R279 Unspecified lack of coordination: Secondary | ICD-10-CM | POA: Diagnosis not present

## 2016-03-02 DIAGNOSIS — M6281 Muscle weakness (generalized): Secondary | ICD-10-CM | POA: Diagnosis not present

## 2016-03-02 DIAGNOSIS — G20 Parkinson's disease: Secondary | ICD-10-CM | POA: Diagnosis not present

## 2016-03-02 DIAGNOSIS — R279 Unspecified lack of coordination: Secondary | ICD-10-CM | POA: Diagnosis not present

## 2016-03-02 DIAGNOSIS — R1311 Dysphagia, oral phase: Secondary | ICD-10-CM | POA: Diagnosis not present

## 2016-03-03 DIAGNOSIS — M6281 Muscle weakness (generalized): Secondary | ICD-10-CM | POA: Diagnosis not present

## 2016-03-03 DIAGNOSIS — R1311 Dysphagia, oral phase: Secondary | ICD-10-CM | POA: Diagnosis not present

## 2016-03-03 DIAGNOSIS — R279 Unspecified lack of coordination: Secondary | ICD-10-CM | POA: Diagnosis not present

## 2016-03-03 DIAGNOSIS — G20 Parkinson's disease: Secondary | ICD-10-CM | POA: Diagnosis not present

## 2016-03-04 DIAGNOSIS — M6281 Muscle weakness (generalized): Secondary | ICD-10-CM | POA: Diagnosis not present

## 2016-03-04 DIAGNOSIS — R1311 Dysphagia, oral phase: Secondary | ICD-10-CM | POA: Diagnosis not present

## 2016-03-04 DIAGNOSIS — R279 Unspecified lack of coordination: Secondary | ICD-10-CM | POA: Diagnosis not present

## 2016-03-04 DIAGNOSIS — G20 Parkinson's disease: Secondary | ICD-10-CM | POA: Diagnosis not present

## 2016-03-05 DIAGNOSIS — M6281 Muscle weakness (generalized): Secondary | ICD-10-CM | POA: Diagnosis not present

## 2016-03-05 DIAGNOSIS — R279 Unspecified lack of coordination: Secondary | ICD-10-CM | POA: Diagnosis not present

## 2016-03-05 DIAGNOSIS — R1311 Dysphagia, oral phase: Secondary | ICD-10-CM | POA: Diagnosis not present

## 2016-03-05 DIAGNOSIS — G20 Parkinson's disease: Secondary | ICD-10-CM | POA: Diagnosis not present

## 2016-03-07 DIAGNOSIS — R1311 Dysphagia, oral phase: Secondary | ICD-10-CM | POA: Diagnosis not present

## 2016-03-07 DIAGNOSIS — M6281 Muscle weakness (generalized): Secondary | ICD-10-CM | POA: Diagnosis not present

## 2016-03-07 DIAGNOSIS — R279 Unspecified lack of coordination: Secondary | ICD-10-CM | POA: Diagnosis not present

## 2016-03-07 DIAGNOSIS — G20 Parkinson's disease: Secondary | ICD-10-CM | POA: Diagnosis not present

## 2016-03-08 ENCOUNTER — Other Ambulatory Visit (HOSPITAL_COMMUNITY): Payer: Self-pay | Admitting: Internal Medicine

## 2016-03-08 DIAGNOSIS — R1311 Dysphagia, oral phase: Secondary | ICD-10-CM | POA: Diagnosis not present

## 2016-03-08 DIAGNOSIS — G20 Parkinson's disease: Secondary | ICD-10-CM | POA: Diagnosis not present

## 2016-03-08 DIAGNOSIS — M6281 Muscle weakness (generalized): Secondary | ICD-10-CM | POA: Diagnosis not present

## 2016-03-08 DIAGNOSIS — R1310 Dysphagia, unspecified: Secondary | ICD-10-CM

## 2016-03-08 DIAGNOSIS — R279 Unspecified lack of coordination: Secondary | ICD-10-CM | POA: Diagnosis not present

## 2016-03-09 DIAGNOSIS — R1311 Dysphagia, oral phase: Secondary | ICD-10-CM | POA: Diagnosis not present

## 2016-03-09 DIAGNOSIS — G20 Parkinson's disease: Secondary | ICD-10-CM | POA: Diagnosis not present

## 2016-03-09 DIAGNOSIS — M6281 Muscle weakness (generalized): Secondary | ICD-10-CM | POA: Diagnosis not present

## 2016-03-09 DIAGNOSIS — R279 Unspecified lack of coordination: Secondary | ICD-10-CM | POA: Diagnosis not present

## 2016-03-10 DIAGNOSIS — G20 Parkinson's disease: Secondary | ICD-10-CM | POA: Diagnosis not present

## 2016-03-10 DIAGNOSIS — R1311 Dysphagia, oral phase: Secondary | ICD-10-CM | POA: Diagnosis not present

## 2016-03-10 DIAGNOSIS — R279 Unspecified lack of coordination: Secondary | ICD-10-CM | POA: Diagnosis not present

## 2016-03-10 DIAGNOSIS — M6281 Muscle weakness (generalized): Secondary | ICD-10-CM | POA: Diagnosis not present

## 2016-03-11 DIAGNOSIS — M6281 Muscle weakness (generalized): Secondary | ICD-10-CM | POA: Diagnosis not present

## 2016-03-11 DIAGNOSIS — G20 Parkinson's disease: Secondary | ICD-10-CM | POA: Diagnosis not present

## 2016-03-11 DIAGNOSIS — R1311 Dysphagia, oral phase: Secondary | ICD-10-CM | POA: Diagnosis not present

## 2016-03-11 DIAGNOSIS — R279 Unspecified lack of coordination: Secondary | ICD-10-CM | POA: Diagnosis not present

## 2016-03-12 DIAGNOSIS — R279 Unspecified lack of coordination: Secondary | ICD-10-CM | POA: Diagnosis not present

## 2016-03-12 DIAGNOSIS — R1311 Dysphagia, oral phase: Secondary | ICD-10-CM | POA: Diagnosis not present

## 2016-03-12 DIAGNOSIS — M6281 Muscle weakness (generalized): Secondary | ICD-10-CM | POA: Diagnosis not present

## 2016-03-12 DIAGNOSIS — G20 Parkinson's disease: Secondary | ICD-10-CM | POA: Diagnosis not present

## 2016-03-14 DIAGNOSIS — R279 Unspecified lack of coordination: Secondary | ICD-10-CM | POA: Diagnosis not present

## 2016-03-14 DIAGNOSIS — R1311 Dysphagia, oral phase: Secondary | ICD-10-CM | POA: Diagnosis not present

## 2016-03-14 DIAGNOSIS — M6281 Muscle weakness (generalized): Secondary | ICD-10-CM | POA: Diagnosis not present

## 2016-03-14 DIAGNOSIS — G20 Parkinson's disease: Secondary | ICD-10-CM | POA: Diagnosis not present

## 2016-03-15 ENCOUNTER — Ambulatory Visit (HOSPITAL_COMMUNITY)
Admission: RE | Admit: 2016-03-15 | Discharge: 2016-03-15 | Disposition: A | Discharge status: Home / Self Care | Payer: Medicare Other | Source: Ambulatory Visit | Attending: Internal Medicine | Admitting: Internal Medicine

## 2016-03-15 DIAGNOSIS — R1310 Dysphagia, unspecified: Secondary | ICD-10-CM

## 2016-03-15 DIAGNOSIS — R1311 Dysphagia, oral phase: Secondary | ICD-10-CM | POA: Diagnosis not present

## 2016-03-15 DIAGNOSIS — M6281 Muscle weakness (generalized): Secondary | ICD-10-CM | POA: Diagnosis not present

## 2016-03-15 DIAGNOSIS — T17300A Unspecified foreign body in larynx causing asphyxiation, initial encounter: Secondary | ICD-10-CM | POA: Diagnosis not present

## 2016-03-15 DIAGNOSIS — G20 Parkinson's disease: Secondary | ICD-10-CM | POA: Diagnosis not present

## 2016-03-15 DIAGNOSIS — R279 Unspecified lack of coordination: Secondary | ICD-10-CM | POA: Diagnosis not present

## 2016-03-16 DIAGNOSIS — R1311 Dysphagia, oral phase: Secondary | ICD-10-CM | POA: Diagnosis not present

## 2016-03-16 DIAGNOSIS — G20 Parkinson's disease: Secondary | ICD-10-CM | POA: Diagnosis not present

## 2016-03-16 DIAGNOSIS — M6281 Muscle weakness (generalized): Secondary | ICD-10-CM | POA: Diagnosis not present

## 2016-03-16 DIAGNOSIS — R279 Unspecified lack of coordination: Secondary | ICD-10-CM | POA: Diagnosis not present

## 2016-03-17 ENCOUNTER — Non-Acute Institutional Stay (SKILLED_NURSING_FACILITY): Payer: Medicare Other | Admitting: Adult Health

## 2016-03-17 DIAGNOSIS — R279 Unspecified lack of coordination: Secondary | ICD-10-CM | POA: Diagnosis not present

## 2016-03-17 DIAGNOSIS — K5900 Constipation, unspecified: Secondary | ICD-10-CM

## 2016-03-17 DIAGNOSIS — G20 Parkinson's disease: Secondary | ICD-10-CM

## 2016-03-17 DIAGNOSIS — I10 Essential (primary) hypertension: Secondary | ICD-10-CM

## 2016-03-17 DIAGNOSIS — F333 Major depressive disorder, recurrent, severe with psychotic symptoms: Secondary | ICD-10-CM

## 2016-03-17 DIAGNOSIS — I482 Chronic atrial fibrillation, unspecified: Secondary | ICD-10-CM

## 2016-03-17 DIAGNOSIS — D6489 Other specified anemias: Secondary | ICD-10-CM

## 2016-03-17 DIAGNOSIS — M1712 Unilateral primary osteoarthritis, left knee: Secondary | ICD-10-CM

## 2016-03-17 DIAGNOSIS — R600 Localized edema: Secondary | ICD-10-CM | POA: Diagnosis not present

## 2016-03-17 DIAGNOSIS — R1311 Dysphagia, oral phase: Secondary | ICD-10-CM | POA: Diagnosis not present

## 2016-03-17 DIAGNOSIS — K5909 Other constipation: Secondary | ICD-10-CM

## 2016-03-17 DIAGNOSIS — M6281 Muscle weakness (generalized): Secondary | ICD-10-CM | POA: Diagnosis not present

## 2016-03-18 ENCOUNTER — Encounter: Payer: Self-pay | Admitting: Adult Health

## 2016-03-18 DIAGNOSIS — R1311 Dysphagia, oral phase: Secondary | ICD-10-CM | POA: Diagnosis not present

## 2016-03-18 DIAGNOSIS — R279 Unspecified lack of coordination: Secondary | ICD-10-CM | POA: Diagnosis not present

## 2016-03-18 DIAGNOSIS — M6281 Muscle weakness (generalized): Secondary | ICD-10-CM | POA: Diagnosis not present

## 2016-03-18 DIAGNOSIS — G20 Parkinson's disease: Secondary | ICD-10-CM | POA: Diagnosis not present

## 2016-03-18 NOTE — Progress Notes (Signed)
Patient ID: [name] [name], female   DOB: 1936/06/02, 80 y.o.   MRN: [id number]   Facility:  [facility]       Allergies  Allergen Reactions  . Penicillins     Chief Complaint  Patient presents with  . Medical Management of Chronic Issues    Follow up    HPI:  She is a long term resident of this facility being seen for the management of her chronic illnesses. Overall there is little change in her status. She tells me that she is feeling good and is not having pain at this time. There are no nursing concerns at this time.   Past Medical History  Diagnosis Date  . Vitamin D deficiency   . Edema   . Anemia   . Hypokalemia   . Depression   . Anxiety   . Parkinson disease ([place])   . Chronic pain   . Hypertension   . A-fib ([place])   . Constipation   . Chronic kidney disease   . UTI (lower urinary tract infection)   . Insomnia   . Intertrochanteric fracture ([place])   . Gait disturbance     Past Surgical History  Procedure Laterality Date  . Right hip surgery due to fracture      VITAL SIGNS BP 120/66 mmHg  Pulse 68  Temp(Src) 98.2 F (36.8 C) (Oral)  Resp 16  Ht 5\' 2"  (1.575 m)  Wt 128 lb (58.06 kg)  BMI 23.41 kg/m2  SpO2 97%  Patient's Medications  New Prescriptions   No medications on file  Previous Medications   ACETAMINOPHEN (TYLENOL) 325 MG TABLET    Take 650 mg by mouth 3 (three) times daily.   ANTISEPTIC ORAL RINSE (BIOTENE) LIQD    15 mLs by Mouth Rinse route 2 (two) times daily.   ASPIRIN 81 MG TABLET    Take 81 mg by mouth daily.   CARBIDOPA-LEVODOPA (SINEMET IR) 25-100 MG PER TABLET    Take 1 tablet by mouth 4 (four) times daily. For muscle spasms   CHOLECALCIFEROL (VITAMIN D) 1000 UNITS TABLET    Take 1,000 Units by mouth daily.   DEXTROMETHORPHAN 15 MG/5ML SYRUP    Take 10 mLs by mouth every 4 (four) hours as needed for cough.    DICLOFENAC SODIUM (VOLTAREN) 1 % GEL    Apply 4 g topically 4 (four) times daily. To knees   DILTIAZEM (CARDIZEM) 60 MG  TABLET    Take 60 mg by mouth daily.   DOCUSATE SODIUM (COLACE) 100 MG CAPSULE    Take 100 mg by mouth 2 (two) times daily.   FLUOXETINE (PROZAC) 20 MG CAPSULE    Take 60 mg by mouth daily. Take 3 tablets to equal 60 mg for anxiety.   FUROSEMIDE (LASIX) 20 MG TABLET    Take 20 mg by mouth daily.   LAMOTRIGINE (LAMICTAL) 150 MG TABLET    Take 150 mg by mouth daily.   MAGNESIUM HYDROXIDE (MILK OF MAGNESIA) 400 MG/5ML SUSPENSION    Take 30 mLs by mouth daily.   MELATONIN 3 MG CAPS    Take 6 mg by mouth at bedtime.   METOPROLOL TARTRATE (LOPRESSOR) 25 MG TABLET    Take 12.5 mg by mouth daily.    MULTIPLE VITAMIN (DAILY VITE) TABS    Take by mouth daily. Take 1 tablet   PROMETHAZINE (PHENERGAN) 25 MG TABLET    Take 25 mg by mouth every 6 (six) hours as needed for nausea or  vomiting.   QUETIAPINE (SEROQUEL) 100 MG TABLET    Take 125 mg by mouth at bedtime. For major depressive disorder   QUETIAPINE (SEROQUEL) 25 MG TABLET    Take 0.5 tablets (12.5 mg total) by mouth at bedtime.   RANITIDINE (ZANTAC) 150 MG TABLET    Take 150 mg by mouth at bedtime.   ROPINIROLE (REQUIP) 5 MG TABLET    Take 5 mg by mouth at bedtime. For anxiety   SENNOSIDES-DOCUSATE SODIUM (SENOKOT-S) 8.6-50 MG TABLET    Take 2 tablets by mouth daily.  Modified Medications   No medications on file  Discontinued Medications   No medications on file     SIGNIFICANT DIAGNOSTIC EXAMS  03-26-15: left knee x-ray: modest osteoarthritis  03-26-15: left hip with pelvis x-ray; modest osteoarthritis left hip     LABS REVIEWED:   02-27-15: wbc 7.2; hgb 10.7; hct 34.5; mcv 90; plt 285 glucose 91; bun 18.9; creat 0.92; k+4.4; na++141; liver normal albumin 3.4  05-02-15: hgb a1c 5.6 05-16-15:urine culture: e-coli: cipro 07-07-15: wbc 8.4; hgb 11.5; hct 38.3; mcv 92.7; plt 324; glucose 104; bun 31.7; creat 0.93; k+ 5.0; na++ 141; liver normal albumin 4.0; tsh 1.52  12-18-15: urine culture: e-coli: cipro    Review of Systems  Unable to  perform ROS: other       Physical Exam Constitutional: No distress.  Thin   Neck: Neck supple. No JVD present. No thyromegaly present.  Breast exam negative  Cardiovascular: Normal rate and intact distal pulses.   Heart rate irregular   Respiratory: lungs clear; no respiratory distress  GI: Soft. She exhibits no distension.  Musculoskeletal: She exhibits no edema.  Is able to move all extremities Has bilateral resting tremors present   Neurological: She is alert.  Skin: Skin is warm and dry. She is not diaphoretic.      ASSESSMENT/ PLAN:  1. Parkinson disease: no change in status will continue sinemet 25/100 mg four times daily and requip 5 mg daily and will monitor   2. Afib: heart rate is stable will continue lopressor 12.5 mg daily and cardizem 60 mg daily for rate control and asa 81 mg daily   3. Hypertension: is stable will continue lopressor 12.5 mg daily and cardizem 60 mg daily   4. Constipation: will continue senna s 2 tabs daily  and colace twice daily   5. Edema: is stable will continue lasix 20 mg daily  6. Major depression: she is followed by [facility]; will continue prozac 60 mg daily; lamictal 150 mg daily to help stabilize mood;  Will continue seroquel  12.5 mg in the AM and 125 mg in the PM will monitor  7. Insomnia: is without change : will continue melatonin 6 mg nightly; the remeron and belsomra have been stopped.   8. Left knee osteoarthritis: will continue  tylenol 650 mg three times daily  voltaren gel 4 gm four times daily for her left knee  will monitor             Ok Edwards NP [facility]  Contact [phone] Monday through Friday 8am- 5pm  After hours call [phone]

## 2016-03-19 DIAGNOSIS — M6281 Muscle weakness (generalized): Secondary | ICD-10-CM | POA: Diagnosis not present

## 2016-03-19 DIAGNOSIS — G20 Parkinson's disease: Secondary | ICD-10-CM | POA: Diagnosis not present

## 2016-03-19 DIAGNOSIS — R1311 Dysphagia, oral phase: Secondary | ICD-10-CM | POA: Diagnosis not present

## 2016-03-19 DIAGNOSIS — R279 Unspecified lack of coordination: Secondary | ICD-10-CM | POA: Diagnosis not present

## 2016-03-21 DIAGNOSIS — G20 Parkinson's disease: Secondary | ICD-10-CM | POA: Diagnosis not present

## 2016-03-21 DIAGNOSIS — R1311 Dysphagia, oral phase: Secondary | ICD-10-CM | POA: Diagnosis not present

## 2016-03-21 DIAGNOSIS — M6281 Muscle weakness (generalized): Secondary | ICD-10-CM | POA: Diagnosis not present

## 2016-03-21 DIAGNOSIS — R279 Unspecified lack of coordination: Secondary | ICD-10-CM | POA: Diagnosis not present

## 2016-03-22 DIAGNOSIS — R279 Unspecified lack of coordination: Secondary | ICD-10-CM | POA: Diagnosis not present

## 2016-03-22 DIAGNOSIS — R1311 Dysphagia, oral phase: Secondary | ICD-10-CM | POA: Diagnosis not present

## 2016-03-22 DIAGNOSIS — M6281 Muscle weakness (generalized): Secondary | ICD-10-CM | POA: Diagnosis not present

## 2016-03-22 DIAGNOSIS — G20 Parkinson's disease: Secondary | ICD-10-CM | POA: Diagnosis not present

## 2016-03-23 DIAGNOSIS — M6281 Muscle weakness (generalized): Secondary | ICD-10-CM | POA: Diagnosis not present

## 2016-03-23 DIAGNOSIS — R1311 Dysphagia, oral phase: Secondary | ICD-10-CM | POA: Diagnosis not present

## 2016-03-23 DIAGNOSIS — G20 Parkinson's disease: Secondary | ICD-10-CM | POA: Diagnosis not present

## 2016-03-23 DIAGNOSIS — R279 Unspecified lack of coordination: Secondary | ICD-10-CM | POA: Diagnosis not present

## 2016-03-24 DIAGNOSIS — G20 Parkinson's disease: Secondary | ICD-10-CM | POA: Diagnosis not present

## 2016-03-24 DIAGNOSIS — M6281 Muscle weakness (generalized): Secondary | ICD-10-CM | POA: Diagnosis not present

## 2016-03-24 DIAGNOSIS — R1311 Dysphagia, oral phase: Secondary | ICD-10-CM | POA: Diagnosis not present

## 2016-03-24 DIAGNOSIS — R279 Unspecified lack of coordination: Secondary | ICD-10-CM | POA: Diagnosis not present

## 2016-03-25 DIAGNOSIS — G20 Parkinson's disease: Secondary | ICD-10-CM | POA: Diagnosis not present

## 2016-03-25 DIAGNOSIS — R1311 Dysphagia, oral phase: Secondary | ICD-10-CM | POA: Diagnosis not present

## 2016-03-25 DIAGNOSIS — R279 Unspecified lack of coordination: Secondary | ICD-10-CM | POA: Diagnosis not present

## 2016-03-25 DIAGNOSIS — M6281 Muscle weakness (generalized): Secondary | ICD-10-CM | POA: Diagnosis not present

## 2016-03-26 IMAGING — RF DG SWALLOWING FUNCTION
1 series · 1 of 1 positions shown · non-contrast
Comparison: None.

CLINICAL DATA: History of flash penetration and dysphagia. Reported
history of neurologic deficits .

EXAM:
MODIFIED BARIUM SWALLOW
TECHNIQUE: Different consistencies of barium were administered orally to the
patient by the Speech Pathologist. Imaging of the pharynx was
performed in the lateral projection.
FLUOROSCOPY TIME:  Fluoroscopy Time:  1.56 minutes .
Number of Acquired Images:  1 image capture.

[Series 1: run · 1 of 1 slices shown]
[im 1/1]
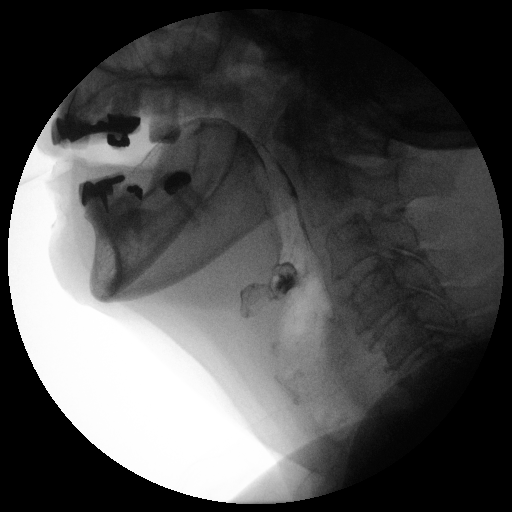

[1 of 1 positions shown; findings below may reference images not displayed]

FINDINGS: Thin liquid- Delayed oral transit. Premature spill into the
vallecula. Silent flash penetration. No aspiration. Mild barium
retention in the vallecula.

[place]?Tiger with cracker- Delayed oral transit. Mild solid retention in
the vallecula.

Barium tablet -  within normal limits
IMPRESSION: Delayed oral transit. Silent flash penetration with thin liquid. No
tracheobronchial aspiration. Mild retention in the vallecula.

Please refer to the Speech Pathologists report for complete details
and recommendations.

## 2016-04-12 ENCOUNTER — Non-Acute Institutional Stay (SKILLED_NURSING_FACILITY): Payer: Medicare Other | Admitting: Internal Medicine

## 2016-04-12 ENCOUNTER — Encounter: Payer: Self-pay | Admitting: Internal Medicine

## 2016-04-12 DIAGNOSIS — R0989 Other specified symptoms and signs involving the circulatory and respiratory systems: Secondary | ICD-10-CM | POA: Diagnosis not present

## 2016-04-12 DIAGNOSIS — R059 Cough, unspecified: Secondary | ICD-10-CM

## 2016-04-12 DIAGNOSIS — I509 Heart failure, unspecified: Secondary | ICD-10-CM | POA: Diagnosis not present

## 2016-04-12 DIAGNOSIS — R05 Cough: Secondary | ICD-10-CM | POA: Diagnosis not present

## 2016-04-12 LAB — BASIC METABOLIC PANEL
BUN: 18 mg/dL (ref 4–21)
CREATININE: 0.8 mg/dL (ref <=1.1)
Glucose: 85 mg/dL
POTASSIUM: 4.8 mmol/L (ref 3.4–5.3)
SODIUM: 139 mmol/L (ref 137–147)

## 2016-04-12 LAB — CBC AND DIFFERENTIAL
HCT: 33 % — AB (ref 36–46)
Hemoglobin: 10 g/dL — AB (ref 12.0–16.0)
Platelets: 404 10*3/uL — AB (ref 150–399)
WBC: 8.2 10^3/mL

## 2016-04-12 NOTE — Progress Notes (Signed)
MRN: [id number] Name: CONCHITA TRUXILLO  Sex: female Age: 80 y.o. DOB: 1936-10-20  [place] #: [facility] Facility/Room:221 Level Of Care: SNF Provider: Inocencio Homes MD Emergency Contacts: Extended Emergency Contact Information Primary Emergency Contact: Dubree,Bobby E Address: [address] [place], [place] [address] [place] Phone: [phone] Relation: Spouse Secondary Emergency Contact: Way,Cheryl Address: [address], [place] [address] [place] Phone: [phone] Relation: Daughter  Code Status:   Allergies: Penicillins  Chief Complaint  Patient presents with  . Acute Visit    HPI: Patient is 80 y.o. female who nursing asked me to ee for pt having a cough and congestion. Pt admits that she has had a cough for 2 weeks with thick sputum but she feels is starting to feel better.Pt admits she would like some cough syrup. Pt has not had fever or MS change.  Past Medical History  Diagnosis Date  . Vitamin D deficiency   . Edema   . Anemia   . Hypokalemia   . Depression   . Anxiety   . Parkinson disease ([place])   . Chronic pain   . Hypertension   . A-fib ([place])   . Constipation   . Chronic kidney disease   . UTI (lower urinary tract infection)   . Insomnia   . Intertrochanteric fracture ([place])   . Gait disturbance     Past Surgical History  Procedure Laterality Date  . Right hip surgery due to fracture        Medication List       This list is accurate as of: 04/12/16 11:59 PM.  Always use your most recent med list.               acetaminophen 325 MG tablet  Commonly known as:  TYLENOL  Take 650 mg by mouth 3 (three) times daily.     antiseptic oral rinse Liqd  15 mLs by Mouth Rinse route 2 (two) times daily.     aspirin 81 MG tablet  Take 81 mg by mouth daily.     carbidopa-levodopa 25-100 MG tablet  Commonly known as:  SINEMET IR  Take 1 tablet by mouth 4 (four) times daily. For  muscle spasms     cholecalciferol 1000 units tablet  Commonly known as:  VITAMIN D  Take 1,000 Units by mouth daily.     DAILY VITE Tabs  Take by mouth daily. Take 1 tablet     dextromethorphan 15 MG/5ML syrup  Take 10 mLs by mouth every 4 (four) hours as needed for cough.     diclofenac sodium 1 % Gel  Commonly known as:  VOLTAREN  Apply 4 g topically 4 (four) times daily. To knees     diltiazem 60 MG tablet  Commonly known as:  CARDIZEM  Take 60 mg by mouth daily.     docusate sodium 100 MG capsule  Commonly known as:  COLACE  Take 100 mg by mouth 2 (two) times daily.     FLUoxetine 20 MG capsule  Commonly known as:  PROZAC  Take 60 mg by mouth daily. Take 3 tablets to equal 60 mg for anxiety.     furosemide 20 MG tablet  Commonly known as:  LASIX  Take 20 mg by mouth daily.     lamoTRIgine 150 MG tablet  Commonly known as:  LAMICTAL  Take 150 mg by mouth daily.     Melatonin 3 MG Caps  Take 6 mg by mouth at bedtime.     metoprolol tartrate 25 MG tablet  Commonly known as:  LOPRESSOR  Take 12.5 mg by mouth daily.     MILK OF MAGNESIA 400 MG/5ML suspension  Generic drug:  magnesium hydroxide  Take 30 mLs by mouth daily.     promethazine 25 MG tablet  Commonly known as:  PHENERGAN  Take 25 mg by mouth every 6 (six) hours as needed for nausea or vomiting.     QUEtiapine 100 MG tablet  Commonly known as:  SEROQUEL  Take 125 mg by mouth at bedtime. For major depressive disorder     QUEtiapine 25 MG tablet  Commonly known as:  SEROQUEL  Take 0.5 tablets (12.5 mg total) by mouth at bedtime.     ranitidine 150 MG tablet  Commonly known as:  ZANTAC  Take 150 mg by mouth at bedtime.     ropinirole 5 MG tablet  Commonly known as:  REQUIP  Take 5 mg by mouth at bedtime. For anxiety     sennosides-docusate sodium 8.6-50 MG tablet  Commonly known as:  SENOKOT-S  Take 2 tablets by mouth daily.        No orders of the defined types were placed in this  encounter.    Immunization History  Administered Date(s) Administered  . Influenza-Unspecified 04/28/2013, 10/09/2014, 02/03/2016  . PPD Test 03/16/2011    Social History  Substance Use Topics  . Smoking status: Never Smoker   . Smokeless tobacco: Never Used  . Alcohol Use: No    Review of Systems  DATA OBTAINED: from patient, nurse GENERAL:  no fevers, fatigue, appetite changes SKIN: No itching, rash HEENT: No complaint RESPIRATORY: + cough, no wheezing, no SOB CARDIAC: No chest pain, palpitations, lower extremity edema  GI: No abdominal pain, No N/V/D or constipation, No heartburn or reflux  GU: No dysuria, frequency or urgency, or incontinence  MUSCULOSKELETAL: No unrelieved bone/joint pain NEUROLOGIC: No headache, dizziness  PSYCHIATRIC: No overt anxiety or sadness  Filed Vitals:   04/12/16 1212  BP: 102/75  Pulse: 109  Temp: 97.8 F (36.6 C)  Resp: 22    Physical Exam  GENERAL APPEARANCE: Alert, conversant, No acute distress  SKIN: No diaphoresis rash HEENT: Unremarkable RESPIRATORY: Breathing is even, unlabored. Lung sounds are rhonchi with goos BS, no rales, no wheezes CARDIOVASCULAR: Heart RRR no murmurs, rubs or gallops. No peripheral edema  GASTROINTESTINAL: Abdomen is soft, non-tender, not distended w/ normal bowel sounds.  GENITOURINARY: Bladder non tender, not distended  MUSCULOSKELETAL: No abnormal joints or musculature NEUROLOGIC: Cranial nerves 2-12 grossly intact. Moves all extremities PSYCHIATRIC: Mood and affect appropriate to situation, no behavioral issues  Patient Active Problem List   Diagnosis Date Noted  . Depression, major, recurrent, severe with psychosis ([place]) 12/29/2015  . Chronic a-fib ([place]) 11/13/2015  . Chronic constipation 11/13/2015  . Bilateral lower extremity edema 11/13/2015  . Osteoarthritis of left knee 05/22/2015  . Anemia due to other cause 02/26/2015  . Insomnia 12/31/2014  . Enchondroma of femur 02/21/2014  .  Vitamin D deficiency 04/17/2013  . Major depression ([place]) 04/17/2013  . Parkinson disease ([employer]) 04/17/2013  . Essential hypertension, benign 04/17/2013  . Tremor, essential 04/17/2013    CBC    Component Value Date/Time   WBC 10.0 12/02/2014   WBC 6.3 11/02/2010 0520   RBC 3.29* 11/02/2010 0520  HGB 9.9* 12/02/2014   HCT 33* 12/02/2014   PLT 428* 12/02/2014   MCV 91.6 11/02/2010 0520   LYMPHSABS 1.6 10/24/2010 0902   MONOABS 0.9 10/24/2010 0902   EOSABS 0.0 10/24/2010 0902   BASOSABS 0.0 10/24/2010 0902    CMP     Component Value Date/Time   NA 144 04/18/2014   NA 140 03/03/2011 0443   K 4.6 04/18/2014   CL 103 03/03/2011 0443   CO2 28 11/02/2010 0520   GLUCOSE 109* 03/03/2011 0443   BUN 23* 04/18/2014   BUN 19 03/03/2011 0443   CREATININE 0.7 04/18/2014   CREATININE 0.8 03/03/2011 0443   CALCIUM 8.8 11/02/2010 0520   PROT 6.7 10/22/2010 1933   ALBUMIN 3.6 10/22/2010 1933   AST 16 10/22/2010 1933   ALT 10 10/22/2010 1933   ALKPHOS 80 10/22/2010 1933   BILITOT 0.5 10/22/2010 1933   GFRNONAA >60 11/02/2010 0520   GFRAA  11/02/2010 0520    >60        The eGFR has been calculated using the MDRD equation. This calculation has not been validated in all clinical situations. eGFR's persistently <60 mL/min signify possible Chronic Kidney Disease.    Assessment and Plan  COUGH - I think pt has had the respiratory virus that has been going around. However need to r/o worse; Have ordered CXR, BMP, CBC; HAVE ORDERED MUCINEX Q 12 FOR A WEEK, TUSSIN COUGH SYRUP SCHEDULED FOR A WEEK AND ALB NEBS tid FOR A WEEK. wILL TX WITH ABX IF pna IS PRESENT.   tIME SPENT  >35 MIN;> 50% of time with patient was spent reviewing records, labs, tests and studies, counseling and developing plan of care  Kallen Mccrystal  MD

## 2016-04-20 ENCOUNTER — Encounter: Payer: Self-pay | Admitting: Internal Medicine

## 2016-04-27 DIAGNOSIS — I2510 Atherosclerotic heart disease of native coronary artery without angina pectoris: Secondary | ICD-10-CM | POA: Diagnosis not present

## 2016-04-27 DIAGNOSIS — B351 Tinea unguium: Secondary | ICD-10-CM | POA: Diagnosis not present

## 2016-04-27 DIAGNOSIS — E119 Type 2 diabetes mellitus without complications: Secondary | ICD-10-CM | POA: Diagnosis not present

## 2016-04-27 DIAGNOSIS — M79671 Pain in right foot: Secondary | ICD-10-CM | POA: Diagnosis not present

## 2016-05-06 ENCOUNTER — Non-Acute Institutional Stay (SKILLED_NURSING_FACILITY): Payer: Medicare Other | Admitting: Internal Medicine

## 2016-05-06 ENCOUNTER — Encounter: Payer: Self-pay | Admitting: Internal Medicine

## 2016-05-06 DIAGNOSIS — M1712 Unilateral primary osteoarthritis, left knee: Secondary | ICD-10-CM | POA: Diagnosis not present

## 2016-05-06 DIAGNOSIS — G4700 Insomnia, unspecified: Secondary | ICD-10-CM | POA: Diagnosis not present

## 2016-05-06 DIAGNOSIS — F333 Major depressive disorder, recurrent, severe with psychotic symptoms: Secondary | ICD-10-CM | POA: Diagnosis not present

## 2016-05-06 NOTE — Assessment & Plan Note (Signed)
will continue melatonin 6 mg nightly; the remeron and belsomra have been stopped.

## 2016-05-06 NOTE — Assessment & Plan Note (Signed)
continue prozac 60 mg daily; lamictal 150 mg daily to help stabilize mood; Will continue seroquel 12.5 mg in the AM and 125 mg in the PM will monitor

## 2016-05-06 NOTE — Progress Notes (Signed)
MRN: [id number] Name: [name] [name]  Sex: female Age: 80 y.o. DOB: January 14, 1936  [place] #: [facility] Facility/Room:221-B Level Of Care: SNF Provider:Marycarmen Hagey, Webb Silversmith MD Emergency Contacts: Extended Emergency Contact Information Primary Emergency Contact: Kingdon,Bobby E Address: [address] [place], [place] [address] [place] Phone: [phone] Relation: Spouse Secondary Emergency Contact: Way,Cheryl Address: [address] [place] [address] [place] Phone: [phone] Relation: Daughter  Code Status: FullCode  Allergies: Penicillins  Chief Complaint  Patient presents with  . Medical Management of Chronic Issues    HPI: Patient is 80 y.o. female who is being seen for routine issues of depression, Insomnia and OA.  Past Medical History  Diagnosis Date  . Vitamin D deficiency   . Edema   . Anemia   . Hypokalemia   . Depression   . Anxiety   . Parkinson disease ([place])   . Chronic pain   . Hypertension   . A-fib ([place])   . Constipation   . Chronic kidney disease   . UTI (lower urinary tract infection)   . Insomnia   . Intertrochanteric fracture ([place])   . Gait disturbance     Past Surgical History  Procedure Laterality Date  . Right hip surgery due to fracture        Medication List       This list is accurate as of: 05/06/16  7:34 PM.  Always use your most recent med list.               acetaminophen 325 MG tablet  Commonly known as:  TYLENOL  Take 650 mg by mouth 3 (three) times daily.     antiseptic oral rinse Liqd  15 mLs by Mouth Rinse route 2 (two) times daily.     aspirin 81 MG tablet  Take 81 mg by mouth daily.     carbidopa-levodopa 25-100 MG tablet  Commonly known as:  SINEMET IR  Take 1 tablet by mouth 4 (four) times daily. For muscle spasms     cholecalciferol 1000 units tablet  Commonly known as:  VITAMIN D  Take 1,000 Units by mouth daily.     DAILY VITE Tabs   Take by mouth daily. Take 1 tablet     dextromethorphan 15 MG/5ML syrup  Take 10 mLs by mouth every 4 (four) hours as needed for cough. Reported on 05/06/2016     diclofenac sodium 1 % Gel  Commonly known as:  VOLTAREN  Apply 4 g topically 4 (four) times daily. To knees     diltiazem 60 MG tablet  Commonly known as:  CARDIZEM  Take 60 mg by mouth daily.     docusate sodium 100 MG capsule  Commonly known as:  COLACE  Take 100 mg by mouth 2 (two) times daily.     FLUoxetine 20 MG capsule  Commonly known as:  PROZAC  Take 60 mg by mouth daily. Take 3 tablets to equal 60 mg for anxiety.     furosemide 20 MG tablet  Commonly known as:  LASIX  Take 20 mg by mouth daily.     lamoTRIgine 150 MG tablet  Commonly known as:  LAMICTAL  Take 150 mg by mouth daily.     Melatonin 3 MG Caps  Take 6 mg by mouth at bedtime.     metoprolol tartrate 25 MG  tablet  Commonly known as:  LOPRESSOR  Take 12.5 mg by mouth daily.     MILK OF MAGNESIA 400 MG/5ML suspension  Generic drug:  magnesium hydroxide  Take 30 mLs by mouth daily.     promethazine 25 MG tablet  Commonly known as:  PHENERGAN  Take 25 mg by mouth every 6 (six) hours as needed for nausea or vomiting.     QUEtiapine 100 MG tablet  Commonly known as:  SEROQUEL  Take 125 mg by mouth at bedtime. For major depressive disorder     QUEtiapine 25 MG tablet  Commonly known as:  SEROQUEL  Take 0.5 tablets (12.5 mg total) by mouth at bedtime.     ranitidine 150 MG tablet  Commonly known as:  ZANTAC  Take 150 mg by mouth at bedtime.     ropinirole 5 MG tablet  Commonly known as:  REQUIP  Take 5 mg by mouth at bedtime. For anxiety     sennosides-docusate sodium 8.6-50 MG tablet  Commonly known as:  SENOKOT-S  Take 2 tablets by mouth daily.        No orders of the defined types were placed in this encounter.    Immunization History  Administered Date(s) Administered  . Influenza-Unspecified 04/28/2013, 10/09/2014,  02/03/2016  . PPD Test 03/16/2011    Social History  Substance Use Topics  . Smoking status: Never Smoker   . Smokeless tobacco: Never Used  . Alcohol Use: No    Review of Systems  - UTO2/2/ dementia    Filed Vitals:   05/06/16 1419  BP: 112/75  Pulse: 86  Temp: 98.2 F (36.8 C)  Resp: 16    Physical Exam  GENERAL APPEARANCE: Alert, mod conversant, No acute distress  SKIN: No diaphoresis rash HEENT: Unremarkable RESPIRATORY: Breathing is even, unlabored. Lung sounds are clear   CARDIOVASCULAR: Heart irreg no murmurs, rubs or gallops. No peripheral edema  GASTROINTESTINAL: Abdomen is soft, non-tender, not distended w/ normal bowel sounds.  GENITOURINARY: Bladder non tender, not distended  MUSCULOSKELETAL: No abnormal joints or musculature NEUROLOGIC: Cranial nerves 2-12 grossly intact. Moves all extremities, mild resting tremor PSYCHIATRIC: Mood and affect appropriate to situation with dementia, no behavioral issues  Patient Active Problem List   Diagnosis Date Noted  . Depression, major, recurrent, severe with psychosis ([place]) 12/29/2015  . Chronic a-fib ([place]) 11/13/2015  . Chronic constipation 11/13/2015  . Bilateral lower extremity edema 11/13/2015  . Osteoarthritis of left knee 05/22/2015  . Anemia due to other cause 02/26/2015  . Insomnia 12/31/2014  . Enchondroma of femur 02/21/2014  . Vitamin D deficiency 04/17/2013  . Major depression ([place]) 04/17/2013  . Parkinson disease ([place]) 04/17/2013  . Essential hypertension, benign 04/17/2013  . Tremor, essential 04/17/2013    CBC    Component Value Date/Time   WBC 8.2 04/12/2016   WBC 6.3 11/02/2010 0520   RBC 3.29* 11/02/2010 0520   HGB 10.0* 04/12/2016   HCT 33* 04/12/2016   PLT 404* 04/12/2016   MCV 91.6 11/02/2010 0520   LYMPHSABS 1.6 10/24/2010 0902   MONOABS 0.9 10/24/2010 0902   EOSABS 0.0 10/24/2010 0902   BASOSABS 0.0 10/24/2010 0902    CMP     Component Value Date/Time   NA 139  04/12/2016   NA 140 03/03/2011 0443   K 4.8 04/12/2016   CL 103 03/03/2011 0443   CO2 28 11/02/2010 0520   GLUCOSE 109* 03/03/2011 0443   BUN 18 04/12/2016   BUN 19 03/03/2011 0443  CREATININE 0.8 04/12/2016   CREATININE 0.8 03/03/2011 0443   CALCIUM 8.8 11/02/2010 0520   PROT 6.7 10/22/2010 1933   ALBUMIN 3.6 10/22/2010 1933   AST 16 10/22/2010 1933   ALT 10 10/22/2010 1933   ALKPHOS 80 10/22/2010 1933   BILITOT 0.5 10/22/2010 1933   GFRNONAA >60 11/02/2010 0520   GFRAA  11/02/2010 0520    >60        The eGFR has been calculated using the MDRD equation. This calculation has not been validated in all clinical situations. eGFR's persistently <60 mL/min signify possible Chronic Kidney Disease.    Assessment and Plan  Depression, major, recurrent, severe with psychosis ([place]) continue prozac 60 mg daily; lamictal 150 mg daily to help stabilize mood; Will continue seroquel 12.5 mg in the AM and 125 mg in the PM will monitor  Insomnia will continue melatonin 6 mg nightly; the remeron and belsomra have been stopped.   Osteoarthritis of left knee continue tylenol 650 mg three times daily voltaren gel 4 gm four times daily for her left knee will monitor     Inocencio Homes  MD

## 2016-05-06 NOTE — Assessment & Plan Note (Signed)
continue tylenol 650 mg three times daily voltaren gel 4 gm four times daily for her left knee will monitor

## 2016-06-03 ENCOUNTER — Emergency Department (HOSPITAL_COMMUNITY): Payer: Medicare Other

## 2016-06-03 ENCOUNTER — Emergency Department (HOSPITAL_COMMUNITY)
Admission: EM | Admit: 2016-06-03 | Discharge: 2016-06-03 | Disposition: A | Discharge status: Home / Self Care | Payer: Medicare Other | Attending: Emergency Medicine | Admitting: Emergency Medicine

## 2016-06-03 ENCOUNTER — Encounter (HOSPITAL_COMMUNITY): Payer: Self-pay | Admitting: Emergency Medicine

## 2016-06-03 DIAGNOSIS — R079 Chest pain, unspecified: Secondary | ICD-10-CM | POA: Insufficient documentation

## 2016-06-03 DIAGNOSIS — N189 Chronic kidney disease, unspecified: Secondary | ICD-10-CM | POA: Insufficient documentation

## 2016-06-03 DIAGNOSIS — R0789 Other chest pain: Secondary | ICD-10-CM | POA: Diagnosis not present

## 2016-06-03 DIAGNOSIS — R05 Cough: Secondary | ICD-10-CM | POA: Diagnosis not present

## 2016-06-03 DIAGNOSIS — R059 Cough, unspecified: Secondary | ICD-10-CM

## 2016-06-03 DIAGNOSIS — I129 Hypertensive chronic kidney disease with stage 1 through stage 4 chronic kidney disease, or unspecified chronic kidney disease: Secondary | ICD-10-CM | POA: Insufficient documentation

## 2016-06-03 DIAGNOSIS — Z7982 Long term (current) use of aspirin: Secondary | ICD-10-CM | POA: Insufficient documentation

## 2016-06-03 DIAGNOSIS — Z79899 Other long term (current) drug therapy: Secondary | ICD-10-CM | POA: Insufficient documentation

## 2016-06-03 DIAGNOSIS — R066 Hiccough: Secondary | ICD-10-CM | POA: Diagnosis not present

## 2016-06-03 DIAGNOSIS — R0981 Nasal congestion: Secondary | ICD-10-CM | POA: Diagnosis not present

## 2016-06-03 DIAGNOSIS — F418 Other specified anxiety disorders: Secondary | ICD-10-CM | POA: Diagnosis not present

## 2016-06-03 LAB — BASIC METABOLIC PANEL
ANION GAP: 6 (ref 5–15)
BUN: 16 mg/dL (ref 6–20)
CO2: 26 mmol/L (ref 22–32)
Calcium: 9.4 mg/dL (ref 8.9–10.3)
Chloride: 105 mmol/L (ref 101–111)
Creatinine, Ser: 0.77 mg/dL (ref 0.44–1.00)
GFR calc Af Amer: >60 mL/min (ref >60)
GLUCOSE: 94 mg/dL (ref 65–99)
POTASSIUM: 4.2 mmol/L (ref 3.5–5.1)
Sodium: 137 mmol/L (ref 135–145)

## 2016-06-03 LAB — CBC WITH DIFFERENTIAL/PLATELET
BASOS ABS: 0 10*3/uL (ref 0.0–0.1)
Basophils Relative: 0 %
Eosinophils Absolute: 0.4 10*3/uL (ref 0.0–0.7)
Eosinophils Relative: 4 %
HEMATOCRIT: 34.3 % — AB (ref 36.0–46.0)
HEMOGLOBIN: 10.3 g/dL — AB (ref 12.0–15.0)
LYMPHS PCT: 23 %
Lymphs Abs: 2.1 10*3/uL (ref 0.7–4.0)
MCH: 26.2 pg (ref 26.0–34.0)
MCHC: 30 g/dL (ref 30.0–36.0)
MCV: 87.3 fL (ref 78.0–100.0)
Monocytes Absolute: 0.6 10*3/uL (ref 0.1–1.0)
Monocytes Relative: 6 %
NEUTROS ABS: 6.3 10*3/uL (ref 1.7–7.7)
NEUTROS PCT: 67 %
PLATELETS: 425 10*3/uL — AB (ref 150–400)
RBC: 3.93 MIL/uL (ref 3.87–5.11)
RDW: 16.6 % — ABNORMAL HIGH (ref 11.5–15.5)
WBC: 9.4 10*3/uL (ref 4.0–10.5)

## 2016-06-03 LAB — I-STAT TROPONIN, ED: Troponin i, poc: 0 ng/mL (ref 0.00–0.08)

## 2016-06-03 NOTE — ED Notes (Signed)
Attempted report to [facility] x 2 with no answer.

## 2016-06-03 NOTE — ED Provider Notes (Signed)
CSN: [id number]     Arrival date & time 06/03/16  1104 History   First MD Initiated Contact with Patient 06/03/16 1214     Chief Complaint  Patient presents with  . Chest Pain  . Cough     (Consider location/radiation/quality/duration/timing/severity/associated sxs/prior Treatment) HPI Comments: Patient presents to the emergency department with chief complaint of chest pain. She states that she noticed she was having centralized chest pain when she woke this morning. She states that it lasted for a few hours, but has now completely resolved. She reports having associated cough and cold symptoms. She denies any shortness of breath, radiating symptoms, or exertional symptoms. She has not taken anything for her symptoms. She denies any abdominal pain. Denies any modifying factors.  The history is provided by the patient. No language interpreter was used.    Past Medical History  Diagnosis Date  . Vitamin D deficiency   . Edema   . Anemia   . Hypokalemia   . Depression   . Anxiety   . Parkinson disease ([place])   . Chronic pain   . Hypertension   . A-fib ([place])   . Constipation   . Chronic kidney disease   . UTI (lower urinary tract infection)   . Insomnia   . Intertrochanteric fracture ([place])   . Gait disturbance    Past Surgical History  Procedure Laterality Date  . Right hip surgery due to fracture     Family History  Problem Relation Age of Onset  . Cancer Mother   . Cancer Father    Social History  Substance Use Topics  . Smoking status: Never Smoker   . Smokeless tobacco: Never Used  . Alcohol Use: No   OB History    No data available     Review of Systems  Constitutional: Negative for fever and chills.  Respiratory: Negative for shortness of breath.   Cardiovascular: Positive for chest pain.  Gastrointestinal: Negative for nausea, vomiting, diarrhea and constipation.  Genitourinary: Negative for dysuria.  All other systems reviewed and are  negative.     Allergies  Penicillins  Home Medications   Prior to Admission medications   Medication Sig Start Date End Date Taking? Authorizing Provider  acetaminophen (TYLENOL) 325 MG tablet Take 650 mg by mouth 3 (three) times daily.    Historical Provider, MD  antiseptic oral rinse (BIOTENE) LIQD 15 mLs by Mouth Rinse route 2 (two) times daily.    Historical Provider, MD  aspirin 81 MG tablet Take 81 mg by mouth daily.    Historical Provider, MD  carbidopa-levodopa (SINEMET IR) 25-100 MG per tablet Take 1 tablet by mouth 4 (four) times daily. For muscle spasms    Historical Provider, MD  cholecalciferol (VITAMIN D) 1000 UNITS tablet Take 1,000 Units by mouth daily.    Historical Provider, MD  dextromethorphan 15 MG/5ML syrup Take 10 mLs by mouth every 4 (four) hours as needed for cough. Reported on 05/06/2016    Historical Provider, MD  diclofenac sodium (VOLTAREN) 1 % GEL Apply 4 g topically 4 (four) times daily. To knees 02/10/16   Gerlene Fee, NP  diltiazem (CARDIZEM) 60 MG tablet Take 60 mg by mouth daily.    Historical Provider, MD  docusate sodium (COLACE) 100 MG capsule Take 100 mg by mouth 2 (two) times daily.    Historical Provider, MD  FLUoxetine (PROZAC) 20 MG capsule Take 60 mg by mouth daily. Take 3 tablets to equal 60 mg for anxiety.  Historical Provider, MD  furosemide (LASIX) 20 MG tablet Take 20 mg by mouth daily.    Historical Provider, MD  lamoTRIgine (LAMICTAL) 150 MG tablet Take 150 mg by mouth daily.    Historical Provider, MD  magnesium hydroxide (MILK OF MAGNESIA) 400 MG/5ML suspension Take 30 mLs by mouth daily.    Historical Provider, MD  Melatonin 3 MG CAPS Take 6 mg by mouth at bedtime.    Historical Provider, MD  metoprolol tartrate (LOPRESSOR) 25 MG tablet Take 12.5 mg by mouth daily.     Historical Provider, MD  Multiple Vitamin (DAILY VITE) TABS Take by mouth daily. Take 1 tablet    Historical Provider, MD  promethazine (PHENERGAN) 25 MG tablet Take  25 mg by mouth every 6 (six) hours as needed for nausea or vomiting.    Historical Provider, MD  QUEtiapine (SEROQUEL) 100 MG tablet Take 125 mg by mouth at bedtime. For major depressive disorder    Historical Provider, MD  QUEtiapine (SEROQUEL) 25 MG tablet Take 0.5 tablets (12.5 mg total) by mouth at bedtime. 12/29/15   Gerlene Fee, NP  ranitidine (ZANTAC) 150 MG tablet Take 150 mg by mouth at bedtime.    Historical Provider, MD  ropinirole (REQUIP) 5 MG tablet Take 5 mg by mouth at bedtime. For anxiety    Historical Provider, MD  sennosides-docusate sodium (SENOKOT-S) 8.6-50 MG tablet Take 2 tablets by mouth daily.    Historical Provider, MD   BP 98/61 mmHg  Pulse 94  Temp(Src) 98.1 F (36.7 C) (Oral)  Resp 20  Ht 5\' 2"  (1.575 m)  Wt 53.071 kg  BMI 21.39 kg/m2  SpO2 96% Physical Exam  Constitutional: She is oriented to person, place, and time. She appears well-developed and well-nourished.  HENT:  Head: Normocephalic and atraumatic.  Eyes: Conjunctivae and EOM are normal. Pupils are equal, round, and reactive to light.  Neck: Normal range of motion. Neck supple.  Cardiovascular: Normal rate.  Exam reveals no gallop and no friction rub.   No murmur heard. Irregular rhythm  Pulmonary/Chest: Effort normal and breath sounds normal. No respiratory distress. She has no wheezes. She has no rales. She exhibits no tenderness.  Abdominal: Soft. Bowel sounds are normal. She exhibits no distension and no mass. There is no tenderness. There is no rebound and no guarding.  Musculoskeletal: Normal range of motion. She exhibits no edema or tenderness.  Neurological: She is alert and oriented to person, place, and time.  Skin: Skin is warm and dry.  Psychiatric: She has a normal mood and affect. Her behavior is normal. Judgment and thought content normal.  Nursing note and vitals reviewed.   ED Course  Procedures (including critical care time) Results for orders placed or performed during the  hospital encounter of 06/03/16  CBC with Differential/Platelet  Result Value Ref Range   WBC 9.4 4.0 - 10.5 K/uL   RBC 3.93 3.87 - 5.11 MIL/uL   Hemoglobin 10.3 (L) 12.0 - 15.0 g/dL   HCT 34.3 (L) 36.0 - 46.0 %   MCV 87.3 78.0 - 100.0 fL   MCH 26.2 26.0 - 34.0 pg   MCHC 30.0 30.0 - 36.0 g/dL   RDW 16.6 (H) 11.5 - 15.5 %   Platelets 425 (H) 150 - 400 K/uL   Neutrophils Relative % 67 %   Neutro Abs 6.3 1.7 - 7.7 K/uL   Lymphocytes Relative 23 %   Lymphs Abs 2.1 0.7 - 4.0 K/uL   Monocytes Relative 6 %  Monocytes Absolute 0.6 0.1 - 1.0 K/uL   Eosinophils Relative 4 %   Eosinophils Absolute 0.4 0.0 - 0.7 K/uL   Basophils Relative 0 %   Basophils Absolute 0.0 0.0 - 0.1 K/uL  Basic metabolic panel  Result Value Ref Range   Sodium 137 135 - 145 mmol/L   Potassium 4.2 3.5 - 5.1 mmol/L   Chloride 105 101 - 111 mmol/L   CO2 26 22 - 32 mmol/L   Glucose, Bld 94 65 - 99 mg/dL   BUN 16 6 - 20 mg/dL   Creatinine, Ser 0.77 0.44 - 1.00 mg/dL   Calcium 9.4 8.9 - 10.3 mg/dL   GFR calc non Af Amer >60 >60 mL/min   GFR calc Af Amer >60 >60 mL/min   Anion gap 6 5 - 15  I-stat troponin, ED  Result Value Ref Range   Troponin i, poc 0.00 0.00 - 0.08 ng/mL   Comment 3           Dg Chest 2 View  06/03/2016  CLINICAL DATA:  Left-sided pain. EXAM: CHEST  2 VIEW COMPARISON:  October 24, 2010 FINDINGS: Cardiomegaly. The hila, mediastinum, lungs, and pleura are unremarkable. IMPRESSION: Cardiomegaly.  No acute abnormality. Electronically Signed   By: Dorise Bullion III M.D   On: 06/03/2016 13:29    I have personally reviewed and evaluated these images and lab results as part of my medical decision-making.   EKG Interpretation   Date/Time:  Thursday June 03 2016 11:11:28 EDT Ventricular Rate:  84 PR Interval:    QRS Duration: 91 QT Interval:  404 QTC Calculation: 478 R Axis:   -16 Text Interpretation:  Atrial fibrillation Borderline left axis deviation  Borderline repolarization abnormality  AGREE, NO SIG CHANGE FROM OLD. NO  STEMI Confirmed by Johnney Killian, MD, Jeannie Done [phone]) on 06/03/2016 3:10:10 PM      MDM   Final diagnoses:  Cough  Chest pain, unspecified chest pain type    Patient with chest pain that started this morning. Pain lasted approximately 2-3 hours. There is no exertional component. Will check labs, EKG, and chest x-ray. Will reassess. Symptom-free at this time.  Troponin is negative, no leukocytosis, chest x-ray negative for pneumonia. Suspect that the chest pain secondary to her recent cough and cold symptoms.  Doubt ACS, negative troponin, no ischemic EKG changes. Patient has been symptom-free since arriving in the ED.  3:09 PM Patient discussed with Dr. Johnney Killian, who agrees with plan for discharge to home.     Montine Circle, PA-C 06/03/16 1524  Charlesetta Shanks, MD 06/14/16 [phone]

## 2016-06-03 NOTE — Discharge Instructions (Signed)
Nonspecific Chest Pain  °Chest pain can be caused by many different conditions. There is always a chance that your pain could be related to something serious, such as a heart attack or a blood clot in your lungs. Chest pain can also be caused by conditions that are not life-threatening. If you have chest pain, it is very important to follow up with your health care provider. °CAUSES  °Chest pain can be caused by: °· Heartburn. °· Pneumonia or bronchitis. °· Anxiety or stress. °· Inflammation around your heart (pericarditis) or lung (pleuritis or pleurisy). °· A blood clot in your lung. °· A collapsed lung (pneumothorax). It can develop suddenly on its own (spontaneous pneumothorax) or from trauma to the chest. °· Shingles infection (varicella-zoster virus). °· Heart attack. °· Damage to the bones, muscles, and cartilage that make up your chest wall. This can include: °¨ Bruised bones due to injury. °¨ Strained muscles or cartilage due to frequent or repeated coughing or overwork. °¨ Fracture to one or more ribs. °¨ Sore cartilage due to inflammation (costochondritis). °RISK FACTORS  °Risk factors for chest pain may include: °· Activities that increase your risk for trauma or injury to your chest. °· Respiratory infections or conditions that cause frequent coughing. °· Medical conditions or overeating that can cause heartburn. °· Heart disease or family history of heart disease. °· Conditions or health behaviors that increase your risk of developing a blood clot. °· Having had chicken pox (varicella zoster). °SIGNS AND SYMPTOMS °Chest pain can feel like: °· Burning or tingling on the surface of your chest or deep in your chest. °· Crushing, pressure, aching, or squeezing pain. °· Dull or sharp pain that is worse when you move, cough, or take a deep breath. °· Pain that is also felt in your back, neck, shoulder, or arm, or pain that spreads to any of these areas. °Your chest pain may come and go, or it may stay  constant. °DIAGNOSIS °Lab tests or other studies may be needed to find the cause of your pain. Your health care provider may have you take a test called an ambulatory ECG (electrocardiogram). An ECG records your heartbeat patterns at the time the test is performed. You may also have other tests, such as: °· Transthoracic echocardiogram (TTE). During echocardiography, sound waves are used to create a picture of all of the heart structures and to look at how blood flows through your heart. °· Transesophageal echocardiogram (TEE). This is a more advanced imaging test that obtains images from inside your body. It allows your health care provider to see your heart in finer detail. °· Cardiac monitoring. This allows your health care provider to monitor your heart rate and rhythm in real time. °· Holter monitor. This is a portable device that records your heartbeat and can help to diagnose abnormal heartbeats. It allows your health care provider to track your heart activity for several days, if needed. °· Stress tests. These can be done through exercise or by taking medicine that makes your heart beat more quickly. °· Blood tests. °· Imaging tests. °TREATMENT  °Your treatment depends on what is causing your chest pain. Treatment may include: °· Medicines. These may include: °¨ Acid blockers for heartburn. °¨ Anti-inflammatory medicine. °¨ Pain medicine for inflammatory conditions. °¨ Antibiotic medicine, if an infection is present. °¨ Medicines to dissolve blood clots. °¨ Medicines to treat coronary artery disease. °· Supportive care for conditions that do not require medicines. This may include: °¨ Resting. °¨ Applying heat   or cold packs to injured areas.  Limiting activities until pain decreases. HOME CARE INSTRUCTIONS  If you were prescribed an antibiotic medicine, finish it all even if you start to feel better.  Avoid any activities that bring on chest pain.  Do not use any tobacco products, including  cigarettes, chewing tobacco, or electronic cigarettes. If you need help quitting, ask your health care provider.  Do not drink alcohol.  Take medicines only as directed by your health care provider.  Keep all follow-up visits as directed by your health care provider. This is important. This includes any further testing if your chest pain does not go away.  If heartburn is the cause for your chest pain, you may be told to keep your head raised (elevated) while sleeping. This reduces the chance that acid will go from your stomach into your esophagus.  Make lifestyle changes as directed by your health care provider. These may include:  Getting regular exercise. Ask your health care provider to suggest some activities that are safe for you.  Eating a heart-healthy diet. A registered dietitian can help you to learn healthy eating options.  Maintaining a healthy weight.  Managing diabetes, if necessary.  Reducing stress. SEEK MEDICAL CARE IF:  Your chest pain does not go away after treatment.  You have a rash with blisters on your chest.  You have a fever. SEEK IMMEDIATE MEDICAL CARE IF:   Your chest pain is worse.  You have an increasing cough, or you cough up blood.  You have severe abdominal pain.  You have severe weakness.  You faint.  You have chills.  You have sudden, unexplained chest discomfort.  You have sudden, unexplained discomfort in your arms, back, neck, or jaw.  You have shortness of breath at any time.  You suddenly start to sweat, or your skin gets clammy.  You feel nauseous or you vomit.  You suddenly feel light-headed or dizzy.  Your heart begins to beat quickly, or it feels like it is skipping beats. These symptoms may represent a serious problem that is an emergency. Do not wait to see if the symptoms will go away. Get medical help right away. Call your local emergency services (911 in the [place] Do not drive yourself to the hospital.   This  information is not intended to replace advice given to you by your health care provider. Make sure you discuss any questions you have with your health care provider.   Document Released: 09/22/2005 Document Revised: 01/03/2015 Document Reviewed: 07/19/2014 Elsevier Interactive Patient Education 2016 Else[employer] Cough, Adult Coughing is a reflex that clears your throat and your airways. Coughing helps to heal and protect your lungs. It is normal to cough occasionally, but a cough that happens with other symptoms or lasts a long time may be a sign of a condition that needs treatment. A cough may last only 2-3 weeks (acute), or it may last longer than 8 weeks (chronic). CAUSES Coughing is commonly caused by:  Breathing in substances that irritate your lungs.  A viral or bacterial respiratory infection.  Allergies.  Asthma.  Postnasal drip.  Smoking.  Acid backing up from the stomach into the esophagus (gastroesophageal reflux).  Certain medicines.  Chronic lung problems, including COPD (or rarely, lung cancer).  Other medical conditions such as heart failure. HOME CARE INSTRUCTIONS  Pay attention to any changes in your symptoms. Take these actions to help with your discomfort:  Take medicines only as told by your health care  provider.  If you were prescribed an antibiotic medicine, take it as told by your health care provider. Do not stop taking the antibiotic even if you start to feel better.  Talk with your health care provider before you take a cough suppressant medicine.  Drink enough fluid to keep your urine clear or pale yellow.  If the air is dry, use a cold steam vaporizer or humidifier in your bedroom or your home to help loosen secretions.  Avoid anything that causes you to cough at work or at home.  If your cough is worse at night, try sleeping in a semi-upright position.  Avoid cigarette smoke. If you smoke, quit smoking. If you need help quitting, ask your  health care provider.  Avoid caffeine.  Avoid alcohol.  Rest as needed. SEEK MEDICAL CARE IF:   You have new symptoms.  You cough up pus.  Your cough does not get better after 2-3 weeks, or your cough gets worse.  You cannot control your cough with suppressant medicines and you are losing sleep.  You develop pain that is getting worse or pain that is not controlled with pain medicines.  You have a fever.  You have unexplained weight loss.  You have night sweats. SEEK IMMEDIATE MEDICAL CARE IF:  You cough up blood.  You have difficulty breathing.  Your heartbeat is very fast.   This information is not intended to replace advice given to you by your health care provider. Make sure you discuss any questions you have with your health care provider.   Document Released: 06/11/2011 Document Revised: 09/03/2015 Document Reviewed: 02/19/2015 Elsevier Interactive Patient Education [employer].

## 2016-06-03 NOTE — ED Notes (Signed)
Per EMS, patient lives at [facility], patient was woke up from sleep with a chest pain which has now resolved.   Patient has had cold/cough for the last few days.   Patient states "I feel fine now".  Denies SOB, radiation to other areas when she had chest pain, other symptoms.   Patient alert, but oriented to time and situation only.  Patient did know the year, but not the month.  Patient did know she was in hospital, but thought [facility].

## 2016-06-03 NOTE — ED Notes (Signed)
Contacted [facility] for transport to [facility]

## 2016-06-03 NOTE — ED Notes (Signed)
Gave patient [place] sandwich and coke, per [employer] [facility] [place]

## 2016-06-04 ENCOUNTER — Non-Acute Institutional Stay (SKILLED_NURSING_FACILITY): Payer: Medicare Other | Admitting: Adult Health

## 2016-06-04 ENCOUNTER — Encounter: Payer: Self-pay | Admitting: Adult Health

## 2016-06-04 DIAGNOSIS — K2100 Gastro-esophageal reflux disease with esophagitis, without bleeding: Secondary | ICD-10-CM

## 2016-06-04 NOTE — Progress Notes (Signed)
Patient ID: [name] [name], female   DOB: [birth year], 80 y.o.   MRN: [id number]   Location:  [facility] Room Number: 221-B Place of Service:  SNF (31)   CODE STATUS: Full Code  Allergies  Allergen Reactions  . Penicillins     Chief Complaint  Patient presents with  . Acute Visit    ER follow up    HPI:  She was taken to the ED for chest pain. She was ruled out for MI and was felt to be epigastric in nature. She and I talked about this. She feels as though she has heart burn and has bloating at this time. We talked about medications; and will start her on prilosec    Past Medical History  Diagnosis Date  . Vitamin D deficiency   . Edema   . Anemia   . Hypokalemia   . Depression   . Anxiety   . Parkinson disease ([place])   . Chronic pain   . Hypertension   . A-fib ([place])   . Constipation   . Chronic kidney disease   . UTI (lower urinary tract infection)   . Insomnia   . Intertrochanteric fracture ([place])   . Gait disturbance     Past Surgical History  Procedure Laterality Date  . Right hip surgery due to fracture      Social History   Social History  . Marital Status: Married    Spouse Name: N/A  . Number of Children: N/A  . Years of Education: N/A   Occupational History  . Not on file.   Social History Main Topics  . Smoking status: Never Smoker   . Smokeless tobacco: Never Used  . Alcohol Use: No  . Drug Use: No  . Sexual Activity: Not on file   Other Topics Concern  . Not on file   Social History Narrative   Family History  Problem Relation Age of Onset  . Cancer Mother   . Cancer Father       VITAL SIGNS BP 128/67 mmHg  Pulse 68  Temp(Src) 97.1 F (36.2 C) (Oral)  Resp 17  Ht 5\' 2"  (1.575 m)  Wt 120 lb (54.432 kg)  BMI 21.94 kg/m2  SpO2 87%  Patient's Medications  New Prescriptions   No medications on file  Previous Medications   ACETAMINOPHEN (TYLENOL) 325 MG TABLET    Take 650 mg by mouth 3  (three) times daily.   ANTISEPTIC ORAL RINSE (BIOTENE) LIQD    15 mLs by Mouth Rinse route 2 (two) times daily.   ASPIRIN 81 MG TABLET    Take 81 mg by mouth daily.   CARBIDOPA-LEVODOPA (SINEMET IR) 25-100 MG PER TABLET    Take 1 tablet by mouth 4 (four) times daily. For muscle spasms   CHOLECALCIFEROL (VITAMIN D) 1000 UNITS TABLET    Take 1,000 Units by mouth daily.   DEXTROMETHORPHAN 15 MG/5ML SYRUP    Take 10 mLs by mouth every 4 (four) hours as needed for cough. Reported on 05/06/2016   DICLOFENAC SODIUM (VOLTAREN) 1 % GEL    Apply 4 g topically 4 (four) times daily. To knees   DILTIAZEM (CARDIZEM) 60 MG TABLET    Take 60 mg by mouth daily.   DOCUSATE SODIUM (COLACE) 100 MG CAPSULE    Take 100 mg by mouth 2 (two) times daily.   FLUOXETINE (PROZAC) 20 MG CAPSULE    Take 60 mg by mouth daily. Take 3  tablets to equal 60 mg for anxiety.   FUROSEMIDE (LASIX) 20 MG TABLET    Take 20 mg by mouth daily.   LAMOTRIGINE (LAMICTAL) 150 MG TABLET    Take 150 mg by mouth daily.   MAGNESIUM HYDROXIDE (MILK OF MAGNESIA) 400 MG/5ML SUSPENSION    Take 30 mLs by mouth daily.   MELATONIN 3 MG CAPS    Take 6 mg by mouth at bedtime.   METOPROLOL TARTRATE (LOPRESSOR) 25 MG TABLET    Take 12.5 mg by mouth daily.    MULTIPLE VITAMIN (DAILY VITE) TABS    Take by mouth daily. Take 1 tablet   PROMETHAZINE (PHENERGAN) 25 MG TABLET    Take 25 mg by mouth every 6 (six) hours as needed for nausea or vomiting.   QUETIAPINE (SEROQUEL) 100 MG TABLET    Take 100 mg by mouth at bedtime. For major depressive disorder   RANITIDINE (ZANTAC) 150 MG TABLET    Take 150 mg by mouth at bedtime.   ROPINIROLE (REQUIP) 5 MG TABLET    Take 5 mg by mouth at bedtime. For anxiety   SENNOSIDES-DOCUSATE SODIUM (SENOKOT-S) 8.6-50 MG TABLET    Take 2 tablets by mouth daily.   TETRACYCLINE (ACHROMYCIN,SUMYCIN) 250 MG CAPSULE    Take 250 mg by mouth 2 (two) times daily before a meal.  Modified Medications   No medications on file  Discontinued  Medications   QUETIAPINE (SEROQUEL) 25 MG TABLET    Take 0.5 tablets (12.5 mg total) by mouth at bedtime.     SIGNIFICANT DIAGNOSTIC EXAMS  03-26-15: left knee x-ray: modest osteoarthritis  03-26-15: left hip with pelvis x-ray; modest osteoarthritis left hip     LABS REVIEWED:   07-07-15: wbc 8.4; hgb 11.5; hct 38.3; mcv 92.7; plt 324; glucose 104; bun 31.7; creat 0.93; k+ 5.0; na++ 141; liver normal albumin 4.0; tsh 1.52  12-18-15: urine culture: e-coli: cipro  04-12-16: wbc 8.2; hgb 10.0; hct 33.0; mcv 85.6; plt 401; glucose 85; bun 17.6; creat 0.85; k+ 4.8; na++ 139  06-03-16: wbc 9.4; hgb 10.3; hct 34.3; mcv 87.3; plt 425; glucose 94; bun 16; creat 0.77; k+ 4.2; na++ 137     Review of Systems  Constitutional: Negative for malaise/fatigue.  Respiratory: Negative for cough and shortness of breath.   Cardiovascular: Negative for chest pain, palpitations and leg swelling.  Gastrointestinal: Positive for heartburn. Negative for abdominal pain and constipation.  Musculoskeletal: Negative for myalgias, back pain and joint pain.  Skin: Negative.   Neurological: Negative for dizziness.  Psychiatric/Behavioral: The patient is not nervous/anxious.       Physical Exam Constitutional: No distress.  Thin   Neck: Neck supple. No JVD present. No thyromegaly present.  Breast exam negative  Cardiovascular: Normal rate and intact distal pulses.   Heart rate irregular   Respiratory: lungs clear; no respiratory distress  GI: Soft. She exhibits no distension.  Musculoskeletal: She exhibits no edema.  Is able to move all extremities Has bilateral resting tremors present   Neurological: She is alert.  Skin: Skin is warm and dry. She is not diaphoretic.      ASSESSMENT/ PLAN:  1. Gerd: will stop the zantac and will begin prilosec 20 mg daily and will monitor her status.     Ok Edwards NP [facility]  Contact [phone] Monday through Friday 8am- 5pm  After hours  call [phone]

## 2016-06-09 ENCOUNTER — Encounter: Payer: Self-pay | Admitting: Adult Health

## 2016-06-09 ENCOUNTER — Non-Acute Institutional Stay (SKILLED_NURSING_FACILITY): Payer: Medicare Other | Admitting: Adult Health

## 2016-06-09 DIAGNOSIS — I482 Chronic atrial fibrillation, unspecified: Secondary | ICD-10-CM

## 2016-06-09 DIAGNOSIS — F333 Major depressive disorder, recurrent, severe with psychotic symptoms: Secondary | ICD-10-CM

## 2016-06-09 DIAGNOSIS — K2100 Gastro-esophageal reflux disease with esophagitis, without bleeding: Secondary | ICD-10-CM

## 2016-06-09 DIAGNOSIS — G20 Parkinson's disease: Secondary | ICD-10-CM

## 2016-06-09 DIAGNOSIS — R600 Localized edema: Secondary | ICD-10-CM

## 2016-06-09 DIAGNOSIS — I10 Essential (primary) hypertension: Secondary | ICD-10-CM | POA: Diagnosis not present

## 2016-06-09 DIAGNOSIS — D6489 Other specified anemias: Secondary | ICD-10-CM

## 2016-06-09 DIAGNOSIS — G20A1 Parkinson's disease without dyskinesia, without mention of fluctuations: Secondary | ICD-10-CM

## 2016-06-09 NOTE — Progress Notes (Signed)
Provider:   Location: [facility] Room Number: 221-B Place of Service:  SNF (31)   PCP: Hennie Duos, MD Patient Care Team: Hennie Duos, MD as PCP - General (Internal Medicine) Gerlene Fee, NP as Nurse Practitioner (Nurse Practitioner) [facility] ([place])  Extended Emergency Contact Information Primary Emergency Contact: Kunkler,Bobby E Address: [address] [place], [place] [address] [place] Phone: [phone] Relation: Spouse Secondary Emergency Contact: Way,Cheryl Address: [address] [place] [address] [place] Phone: [phone] Relation: Daughter  Code Status: Full Code Goals of Care: Advanced Directive information Advanced Directives 06/03/2016  Does patient have an advance directive? No  Type of Advance Directive -  Does patient want to make changes to advanced directive? -  Copy of advanced directive(s) in chart? -  Would patient like information on creating an advanced directive? No - patient declined information      Allergies  Allergen Reactions  . Penicillins      Chief Complaint  Patient presents with  . Annual Exam    Annual Exam    HPI: Patient is a 80 y.o. female seen today for an annual comprehensive examination. Her status has remained fairly stable over the past year. She has not required any hospitalization in the past year. She will get out of bed to get her hair done; but usually spends her time in bed. She is not voicing any concerns or complaints at this time. There are no nursing concerns a this time.    Past Medical History  Diagnosis Date  . Vitamin D deficiency   . Edema   . Anemia   . Hypokalemia   . Depression   . Anxiety   . Parkinson disease ([place])   . Chronic pain   . Hypertension   . A-fib ([place])   . Constipation   . Chronic kidney disease   . UTI (lower urinary tract  infection)   . Insomnia   . Intertrochanteric fracture ([place])   . Gait disturbance    Past Surgical History  Procedure Laterality Date  . Right hip surgery due to fracture      reports that she has never smoked. She has never used smokeless tobacco. She reports that she does not drink alcohol or use illicit drugs. Social History   Social History  . Marital Status: Married    Spouse Name: N/A  . Number of Children: N/A  . Years of Education: N/A   Occupational History  . Not on file.   Social History Main Topics  . Smoking status: Never Smoker   . Smokeless tobacco: Never Used  . Alcohol Use: No  . Drug Use: No  . Sexual Activity: Not on file   Other Topics Concern  . Not on file   Social History Narrative   Family History  Problem Relation Age of Onset  . Cancer Mother   . Cancer Father     Danley Danker Vitals:   06/09/16 1505  BP: 123/58  Pulse: 65  Temp: 98.3 F (36.8 C)  TempSrc: Oral  Resp: 16  Height: 5\' 2"  (1.575 m)  Weight: 120 lb (54.432 kg)  SpO2: 98%   Body mass index is 21.94 kg/(m^2).    Medication List       This list is accurate as of:  06/09/16  3:20 PM.  Always use your most recent med list.               acetaminophen 325 MG tablet  Commonly known as:  TYLENOL  Take 650 mg by mouth 3 (three) times daily.     antiseptic oral rinse Liqd  15 mLs by Mouth Rinse route 2 (two) times daily.     aspirin 81 MG tablet  Take 81 mg by mouth daily.     carbidopa-levodopa 25-100 MG tablet  Commonly known as:  SINEMET IR  Take 1 tablet by mouth 4 (four) times daily. For muscle spasms     cholecalciferol 1000 units tablet  Commonly known as:  VITAMIN D  Take 1,000 Units by mouth daily.     DAILY VITE Tabs  Take by mouth daily. Take 1 tablet     dextromethorphan 15 MG/5ML syrup  Take 10 mLs by mouth every 4 (four) hours as needed for cough. Reported on 05/06/2016     diclofenac sodium 1 % Gel  Commonly known as:  VOLTAREN  Apply 4 g  topically 4 (four) times daily. To knees     diltiazem 60 MG tablet  Commonly known as:  CARDIZEM  Take 60 mg by mouth daily.     docusate sodium 100 MG capsule  Commonly known as:  COLACE  Take 100 mg by mouth 2 (two) times daily.     FLUoxetine 20 MG capsule  Commonly known as:  PROZAC  Take 60 mg by mouth daily. Take 3 tablets to equal 60 mg for anxiety.     furosemide 20 MG tablet  Commonly known as:  LASIX  Take 20 mg by mouth daily.     lamoTRIgine 150 MG tablet  Commonly known as:  LAMICTAL  Take 150 mg by mouth daily.     Melatonin 3 MG Caps  Take 6 mg by mouth at bedtime.     metoprolol tartrate 25 MG tablet  Commonly known as:  LOPRESSOR  Take 12.5 mg by mouth daily.     MILK OF MAGNESIA 400 MG/5ML suspension  Generic drug:  magnesium hydroxide  Take 30 mLs by mouth daily.     omeprazole 20 MG capsule  Commonly known as:  PRILOSEC  Take 20 mg by mouth daily.     promethazine 25 MG tablet  Commonly known as:  PHENERGAN  Take 25 mg by mouth every 6 (six) hours as needed for nausea or vomiting.     QUEtiapine 100 MG tablet  Commonly known as:  SEROQUEL  Take 100 mg by mouth at bedtime. For major depressive disorder     ropinirole 5 MG tablet  Commonly known as:  REQUIP  Take 5 mg by mouth at bedtime. For anxiety     sennosides-docusate sodium 8.6-50 MG tablet  Commonly known as:  SENOKOT-S  Take 2 tablets by mouth daily.       SIGNIFICANT DIAGNOSTIC EXAMS  03-26-15: left knee x-ray: modest osteoarthritis  03-26-15: left hip with pelvis x-ray; modest osteoarthritis left hip   06-03-16: chest x-ray: Cardiomegaly.  No acute abnormality   LABS REVIEWED:   07-07-15: wbc 8.4; hgb 11.5; hct 38.3; mcv 92.7; plt 324; glucose 104; bun 31.7; creat 0.93; k+ 5.0; na++ 141; liver normal albumin 4.0; tsh 1.52  12-18-15: urine culture: e-coli: cipro  04-12-16: wbc 8.2; hgb 10.0; hct 33.0; mcv 85.6; plt 401; glucose 85; bun 17.6; creat 0.85; k+ 4.8; na++ 139  06-03-16: wbc 9.4; hgb 10.3; hct 34.3; mcv 87.3; plt 425; glucose 94; bun 16; creat 0.77; k+ 4.2; na++ 137     Review of Systems  Constitutional: Negative for malaise/fatigue.  Respiratory: Negative for cough and shortness of breath.   Cardiovascular: Negative for chest pain, palpitations and leg swelling.  Gastrointestinal: negative for heartburn Negative for abdominal pain and constipation.  Musculoskeletal: Negative for myalgias, back pain and joint pain.  Skin: Negative.   Neurological: Negative for dizziness.  Psychiatric/Behavioral: The patient is not nervous/anxious.        Physical Exam Constitutional: No distress.  Thin   Neck: Neck supple. No JVD present. No thyromegaly present.  Breast exam negative  Cardiovascular: Normal rate and intact distal pulses.   Heart rate irregular   Respiratory: lungs clear; no respiratory distress  GI: Soft. She exhibits no distension.  Musculoskeletal: She exhibits no edema.  Is able to move all extremities Has bilateral resting tremors present   Neurological: She is alert.  Skin: Skin is warm and dry. She is not diaphoretic.      ASSESSMENT/ PLAN:  1. Parkinson disease: no change in status will continue sinemet 25/100 mg four times daily and requip 5 mg daily and will monitor   2. Afib: heart rate is stable will continue lopressor 12.5 mg daily and cardizem 60 mg daily for rate control and asa 81 mg daily   3. Hypertension: is stable will continue lopressor 12.5 mg daily and cardizem 60 mg daily   4. Constipation: will continue senna s 2 tabs daily  and colace twice daily   5. Edema: is stable will continue lasix 20 mg daily  6. Major depression: she is followed by [facility]; will continue prozac 60 mg daily; lamictal 150 mg daily to help stabilize mood;  Will continue seroquel  100 mg in the PM will monitor  7. Insomnia: is without change : will continue melatonin 6 mg nightly; the remeron and belsomra have been stopped.   8.  Left knee osteoarthritis: will continue  tylenol 650 mg three times daily  voltaren gel 4 gm four times daily for her left knee  will monitor   9. Gerd:  Will continue prilosec 20 mg daily     Time spent with patient  45  minutes >50% time spent counseling; reviewing medical record; tests; labs; and developing future plan of care     Ok Edwards NP [facility] Adult Medicine  Contact [phone] Monday through Friday 8am- 5pm  After hours call [phone]

## 2016-06-12 DIAGNOSIS — K219 Gastro-esophageal reflux disease without esophagitis: Secondary | ICD-10-CM | POA: Insufficient documentation

## 2016-07-14 ENCOUNTER — Encounter: Payer: Self-pay | Admitting: Adult Health

## 2016-07-14 ENCOUNTER — Non-Acute Institutional Stay (SKILLED_NURSING_FACILITY): Payer: Medicare Other | Admitting: Adult Health

## 2016-07-14 DIAGNOSIS — I10 Essential (primary) hypertension: Secondary | ICD-10-CM

## 2016-07-14 DIAGNOSIS — K2100 Gastro-esophageal reflux disease with esophagitis, without bleeding: Secondary | ICD-10-CM

## 2016-07-14 DIAGNOSIS — F333 Major depressive disorder, recurrent, severe with psychotic symptoms: Secondary | ICD-10-CM | POA: Diagnosis not present

## 2016-07-14 DIAGNOSIS — R600 Localized edema: Secondary | ICD-10-CM

## 2016-07-14 DIAGNOSIS — G20A1 Parkinson's disease without dyskinesia, without mention of fluctuations: Secondary | ICD-10-CM

## 2016-07-14 DIAGNOSIS — M1712 Unilateral primary osteoarthritis, left knee: Secondary | ICD-10-CM | POA: Diagnosis not present

## 2016-07-14 DIAGNOSIS — I482 Chronic atrial fibrillation, unspecified: Secondary | ICD-10-CM

## 2016-07-14 DIAGNOSIS — G20 Parkinson's disease: Secondary | ICD-10-CM

## 2016-07-14 NOTE — Progress Notes (Signed)
Patient ID: [name] [name], female   DOB: 1936-11-18, 80 y.o.   MRN: [id number]   Location:   [facility] Room Number: 221-B Place of Service:  SNF (31)   CODE STATUS: Full Code  Allergies  Allergen Reactions  . Penicillins     Chief Complaint  Patient presents with  . Medical Management of Chronic Issues    Follow up    HPI:  She is a long term resident of this facility being seen for the management of her chronic illnesses. She is having nausea and vomiting. She does not have constipation. Her main indicator of uti is n/v. She tells me that she does not feel good. There are no reports of fever present.    Past Medical History  Diagnosis Date  . Vitamin D deficiency   . Edema   . Anemia   . Hypokalemia   . Depression   . Anxiety   . Parkinson disease ([place])   . Chronic pain   . Hypertension   . A-fib ([place])   . Constipation   . Chronic kidney disease   . UTI (lower urinary tract infection)   . Insomnia   . Intertrochanteric fracture ([place])   . Gait disturbance     Past Surgical History  Procedure Laterality Date  . Right hip surgery due to fracture      Social History   Social History  . Marital Status: Married    Spouse Name: N/A  . Number of Children: N/A  . Years of Education: N/A   Occupational History  . Not on file.   Social History Main Topics  . Smoking status: Never Smoker   . Smokeless tobacco: Never Used  . Alcohol Use: No  . Drug Use: No  . Sexual Activity: Not on file   Other Topics Concern  . Not on file   Social History Narrative   Family History  Problem Relation Age of Onset  . Cancer Mother   . Cancer Father       VITAL SIGNS BP 132/65 mmHg  Pulse 76  Temp(Src) 97.6 F (36.4 C) (Oral)  Resp 20  Ht 5\' 2"  (1.575 m)  Wt 116 lb 6 oz (52.787 kg)  BMI 21.28 kg/m2  SpO2 98%  Patient's Medications  New Prescriptions   No medications on file  Previous Medications   ACETAMINOPHEN (TYLENOL) 325 MG TABLET     Take 650 mg by mouth 3 (three) times daily.   ANTISEPTIC ORAL RINSE (BIOTENE) LIQD    15 mLs by Mouth Rinse route 2 (two) times daily.   ASPIRIN 81 MG TABLET    Take 81 mg by mouth daily.   CARBIDOPA-LEVODOPA (SINEMET IR) 25-100 MG PER TABLET    Take 1 tablet by mouth 4 (four) times daily. For muscle spasms   CHOLECALCIFEROL (VITAMIN D) 1000 UNITS TABLET    Take 1,000 Units by mouth daily.   DEXTROMETHORPHAN 15 MG/5ML SYRUP    Take 10 mLs by mouth every 4 (four) hours as needed for cough. Reported on 05/06/2016   DICLOFENAC SODIUM (VOLTAREN) 1 % GEL    Apply 4 g topically 4 (four) times daily. To knees   DILTIAZEM (CARDIZEM) 60 MG TABLET    Take 60 mg by mouth daily.   DOCUSATE SODIUM (COLACE) 100 MG CAPSULE    Take 100 mg by mouth 2 (two) times daily.   FLUOXETINE (PROZAC) 20 MG CAPSULE    Take 60 mg by mouth daily. Take  3 tablets to equal 60 mg for anxiety.   FUROSEMIDE (LASIX) 20 MG TABLET    Take 20 mg by mouth daily.   LAMOTRIGINE (LAMICTAL) 150 MG TABLET    Take 150 mg by mouth daily.   MAGNESIUM HYDROXIDE (MILK OF MAGNESIA) 400 MG/5ML SUSPENSION    Take 30 mLs by mouth daily.   MELATONIN 3 MG CAPS    Take 6 mg by mouth at bedtime.   METOPROLOL TARTRATE (LOPRESSOR) 25 MG TABLET    Take 12.5 mg by mouth daily.    MULTIPLE VITAMIN (DAILY VITE) TABS    Take by mouth daily. Take 1 tablet   OMEPRAZOLE (PRILOSEC) 20 MG CAPSULE    Take 20 mg by mouth daily.   PROMETHAZINE (PHENERGAN) 25 MG TABLET    Take 25 mg by mouth every 6 (six) hours as needed for nausea or vomiting.   QUETIAPINE (SEROQUEL) 100 MG TABLET    Take 100 mg by mouth at bedtime. For major depressive disorder   ROPINIROLE (REQUIP) 5 MG TABLET    Take 5 mg by mouth at bedtime. For anxiety   SENNOSIDES-DOCUSATE SODIUM (SENOKOT-S) 8.6-50 MG TABLET    Take 2 tablets by mouth daily.  Modified Medications   No medications on file  Discontinued Medications   No medications on file     SIGNIFICANT DIAGNOSTIC EXAMS  03-26-15: left  knee x-ray: modest osteoarthritis  03-26-15: left hip with pelvis x-ray; modest osteoarthritis left hip   06-03-16: chest x-ray: Cardiomegaly.  No acute abnormality   LABS REVIEWED:   07-07-15: wbc 8.4; hgb 11.5; hct 38.3; mcv 92.7; plt 324; glucose 104; bun 31.7; creat 0.93; k+ 5.0; na++ 141; liver normal albumin 4.0; tsh 1.52  12-18-15: urine culture: e-coli: cipro  04-12-16: wbc 8.2; hgb 10.0; hct 33.0; mcv 85.6; plt 401; glucose 85; bun 17.6; creat 0.85; k+ 4.8; na++ 139  06-03-16: wbc 9.4; hgb 10.3; hct 34.3; mcv 87.3; plt 425; glucose 94; bun 16; creat 0.77; k+ 4.2; na++ 137     Review of Systems  Constitutional: Negative for malaise/fatigue.  Respiratory: Negative for cough and shortness of breath.   Cardiovascular: Negative for chest pain, palpitations and leg swelling.  Gastrointestinal: negative for heartburn has nausea/vomiting Musculoskeletal: Negative for myalgias, back pain and joint pain.  Skin: Negative.   Neurological: Negative for dizziness.  Psychiatric/Behavioral: The patient is not nervous/anxious.        Physical Exam Constitutional: No distress.  Thin   Neck: Neck supple. No JVD present. No thyromegaly present.  Breast exam negative  Cardiovascular: Normal rate and intact distal pulses.   Heart rate irregular   Respiratory: lungs clear; no respiratory distress  GI: Soft. She exhibits no distension.  Musculoskeletal: She exhibits no edema.  Is able to move all extremities Has bilateral resting tremors present   Neurological: She is alert.  Skin: Skin is warm and dry. She is not diaphoretic.      ASSESSMENT/ PLAN:  1. Parkinson disease: no change in status will continue sinemet 25/100 mg four times daily and requip 5 mg daily and will monitor   2. Afib: heart rate is stable will continue lopressor 12.5 mg daily and cardizem 60 mg daily for rate control and asa 81 mg daily   3. Hypertension: is stable will continue lopressor 12.5 mg daily and  cardizem 60 mg daily   4. Constipation: will continue senna s 2 tabs daily  and colace twice daily   5. Edema: is stable will  continue lasix 20 mg daily  6. Major depression: she is followed by [facility]; will continue prozac 60 mg daily; lamictal 150 mg daily to help stabilize mood;  Will continue seroquel  100 mg in the PM will monitor  7. Insomnia: is without change : will continue melatonin 6 mg nightly;    8. Left knee osteoarthritis: will continue  tylenol 650 mg three times daily  voltaren gel 4 gm four times daily for her left knee  will monitor   9. Gerd:  Will continue prilosec 20 mg daily   Due to her nausea/vomiting will get an UA/C&S; will treat as indicated.    Ok Edwards NP [facility]  Contact [phone] Monday through Friday 8am- 5pm  After hours call [phone]

## 2016-07-15 DIAGNOSIS — I509 Heart failure, unspecified: Secondary | ICD-10-CM | POA: Diagnosis not present

## 2016-07-15 DIAGNOSIS — R319 Hematuria, unspecified: Secondary | ICD-10-CM | POA: Diagnosis not present

## 2016-07-15 DIAGNOSIS — N390 Urinary tract infection, site not specified: Secondary | ICD-10-CM | POA: Diagnosis not present

## 2016-07-15 LAB — CBC AND DIFFERENTIAL
HCT: 32 % — AB (ref 36–46)
Hemoglobin: 10.4 g/dL — AB (ref 12.0–16.0)
Platelets: 470 10*3/uL — AB (ref 150–399)
WBC: 8.8 10*3/mL

## 2016-07-15 LAB — BASIC METABOLIC PANEL
BUN: 12 mg/dL (ref 4–21)
Creatinine: 0.7 mg/dL (ref 0.5–1.1)
GLUCOSE: 92 mg/dL
POTASSIUM: 4.6 mmol/L (ref 3.4–5.3)
Sodium: 137 mmol/L (ref 137–147)

## 2016-07-15 LAB — HEPATIC FUNCTION PANEL
ALT: 9 U/L (ref 7–35)
AST: 91 U/L — AB (ref 13–35)
Alkaline Phosphatase: 31 U/L (ref 25–125)

## 2016-07-16 DIAGNOSIS — R319 Hematuria, unspecified: Secondary | ICD-10-CM | POA: Diagnosis not present

## 2016-07-16 DIAGNOSIS — N390 Urinary tract infection, site not specified: Secondary | ICD-10-CM | POA: Diagnosis not present

## 2016-08-04 DIAGNOSIS — E559 Vitamin D deficiency, unspecified: Secondary | ICD-10-CM | POA: Diagnosis not present

## 2016-08-04 DIAGNOSIS — G218 Other secondary parkinsonism: Secondary | ICD-10-CM | POA: Diagnosis not present

## 2016-08-04 DIAGNOSIS — I4891 Unspecified atrial fibrillation: Secondary | ICD-10-CM | POA: Diagnosis not present

## 2016-08-04 DIAGNOSIS — I120 Hypertensive chronic kidney disease with stage 5 chronic kidney disease or end stage renal disease: Secondary | ICD-10-CM | POA: Diagnosis not present

## 2016-08-06 DIAGNOSIS — R279 Unspecified lack of coordination: Secondary | ICD-10-CM | POA: Diagnosis not present

## 2016-08-06 DIAGNOSIS — M6281 Muscle weakness (generalized): Secondary | ICD-10-CM | POA: Diagnosis not present

## 2016-08-06 DIAGNOSIS — G20 Parkinson's disease: Secondary | ICD-10-CM | POA: Diagnosis not present

## 2016-08-06 DIAGNOSIS — R1311 Dysphagia, oral phase: Secondary | ICD-10-CM | POA: Diagnosis not present

## 2016-08-09 DIAGNOSIS — G20 Parkinson's disease: Secondary | ICD-10-CM | POA: Diagnosis not present

## 2016-08-09 DIAGNOSIS — R1311 Dysphagia, oral phase: Secondary | ICD-10-CM | POA: Diagnosis not present

## 2016-08-09 DIAGNOSIS — M6281 Muscle weakness (generalized): Secondary | ICD-10-CM | POA: Diagnosis not present

## 2016-08-09 DIAGNOSIS — R279 Unspecified lack of coordination: Secondary | ICD-10-CM | POA: Diagnosis not present

## 2016-08-10 DIAGNOSIS — R1311 Dysphagia, oral phase: Secondary | ICD-10-CM | POA: Diagnosis not present

## 2016-08-10 DIAGNOSIS — R279 Unspecified lack of coordination: Secondary | ICD-10-CM | POA: Diagnosis not present

## 2016-08-10 DIAGNOSIS — M6281 Muscle weakness (generalized): Secondary | ICD-10-CM | POA: Diagnosis not present

## 2016-08-10 DIAGNOSIS — G20 Parkinson's disease: Secondary | ICD-10-CM | POA: Diagnosis not present

## 2016-08-11 DIAGNOSIS — R1311 Dysphagia, oral phase: Secondary | ICD-10-CM | POA: Diagnosis not present

## 2016-08-11 DIAGNOSIS — R279 Unspecified lack of coordination: Secondary | ICD-10-CM | POA: Diagnosis not present

## 2016-08-11 DIAGNOSIS — G20 Parkinson's disease: Secondary | ICD-10-CM | POA: Diagnosis not present

## 2016-08-11 DIAGNOSIS — M6281 Muscle weakness (generalized): Secondary | ICD-10-CM | POA: Diagnosis not present

## 2016-08-12 DIAGNOSIS — R279 Unspecified lack of coordination: Secondary | ICD-10-CM | POA: Diagnosis not present

## 2016-08-12 DIAGNOSIS — M6281 Muscle weakness (generalized): Secondary | ICD-10-CM | POA: Diagnosis not present

## 2016-08-12 DIAGNOSIS — G20 Parkinson's disease: Secondary | ICD-10-CM | POA: Diagnosis not present

## 2016-08-12 DIAGNOSIS — R1311 Dysphagia, oral phase: Secondary | ICD-10-CM | POA: Diagnosis not present

## 2016-08-13 DIAGNOSIS — R279 Unspecified lack of coordination: Secondary | ICD-10-CM | POA: Diagnosis not present

## 2016-08-13 DIAGNOSIS — R1311 Dysphagia, oral phase: Secondary | ICD-10-CM | POA: Diagnosis not present

## 2016-08-13 DIAGNOSIS — M6281 Muscle weakness (generalized): Secondary | ICD-10-CM | POA: Diagnosis not present

## 2016-08-13 DIAGNOSIS — G20 Parkinson's disease: Secondary | ICD-10-CM | POA: Diagnosis not present

## 2016-08-15 DIAGNOSIS — G20 Parkinson's disease: Secondary | ICD-10-CM | POA: Diagnosis not present

## 2016-08-15 DIAGNOSIS — M6281 Muscle weakness (generalized): Secondary | ICD-10-CM | POA: Diagnosis not present

## 2016-08-15 DIAGNOSIS — R279 Unspecified lack of coordination: Secondary | ICD-10-CM | POA: Diagnosis not present

## 2016-08-15 DIAGNOSIS — R1311 Dysphagia, oral phase: Secondary | ICD-10-CM | POA: Diagnosis not present

## 2016-08-16 DIAGNOSIS — R279 Unspecified lack of coordination: Secondary | ICD-10-CM | POA: Diagnosis not present

## 2016-08-16 DIAGNOSIS — R1311 Dysphagia, oral phase: Secondary | ICD-10-CM | POA: Diagnosis not present

## 2016-08-16 DIAGNOSIS — M6281 Muscle weakness (generalized): Secondary | ICD-10-CM | POA: Diagnosis not present

## 2016-08-16 DIAGNOSIS — G20 Parkinson's disease: Secondary | ICD-10-CM | POA: Diagnosis not present

## 2016-08-17 DIAGNOSIS — R1311 Dysphagia, oral phase: Secondary | ICD-10-CM | POA: Diagnosis not present

## 2016-08-17 DIAGNOSIS — R279 Unspecified lack of coordination: Secondary | ICD-10-CM | POA: Diagnosis not present

## 2016-08-17 DIAGNOSIS — M6281 Muscle weakness (generalized): Secondary | ICD-10-CM | POA: Diagnosis not present

## 2016-08-17 DIAGNOSIS — G20 Parkinson's disease: Secondary | ICD-10-CM | POA: Diagnosis not present

## 2016-08-18 DIAGNOSIS — G20 Parkinson's disease: Secondary | ICD-10-CM | POA: Diagnosis not present

## 2016-08-18 DIAGNOSIS — R1311 Dysphagia, oral phase: Secondary | ICD-10-CM | POA: Diagnosis not present

## 2016-08-18 DIAGNOSIS — R279 Unspecified lack of coordination: Secondary | ICD-10-CM | POA: Diagnosis not present

## 2016-08-18 DIAGNOSIS — M6281 Muscle weakness (generalized): Secondary | ICD-10-CM | POA: Diagnosis not present

## 2016-08-19 ENCOUNTER — Non-Acute Institutional Stay (SKILLED_NURSING_FACILITY): Payer: Medicare Other | Admitting: Internal Medicine

## 2016-08-19 ENCOUNTER — Encounter: Payer: Self-pay | Admitting: Internal Medicine

## 2016-08-19 DIAGNOSIS — D6489 Other specified anemias: Secondary | ICD-10-CM

## 2016-08-19 DIAGNOSIS — R279 Unspecified lack of coordination: Secondary | ICD-10-CM | POA: Diagnosis not present

## 2016-08-19 DIAGNOSIS — G20 Parkinson's disease: Secondary | ICD-10-CM | POA: Diagnosis not present

## 2016-08-19 DIAGNOSIS — K5900 Constipation, unspecified: Secondary | ICD-10-CM | POA: Diagnosis not present

## 2016-08-19 DIAGNOSIS — K2100 Gastro-esophageal reflux disease with esophagitis, without bleeding: Secondary | ICD-10-CM

## 2016-08-19 DIAGNOSIS — F333 Major depressive disorder, recurrent, severe with psychotic symptoms: Secondary | ICD-10-CM

## 2016-08-19 DIAGNOSIS — R112 Nausea with vomiting, unspecified: Secondary | ICD-10-CM | POA: Diagnosis not present

## 2016-08-19 DIAGNOSIS — K921 Melena: Secondary | ICD-10-CM | POA: Diagnosis not present

## 2016-08-19 DIAGNOSIS — K5909 Other constipation: Secondary | ICD-10-CM

## 2016-08-19 DIAGNOSIS — R1311 Dysphagia, oral phase: Secondary | ICD-10-CM | POA: Diagnosis not present

## 2016-08-19 DIAGNOSIS — M6281 Muscle weakness (generalized): Secondary | ICD-10-CM | POA: Diagnosis not present

## 2016-08-19 NOTE — Progress Notes (Signed)
Patient ID: [name] [name], female   DOB: Oct 14, 1936, 80 y.o.   MRN: [id number]    DATE: 08/19/2016  Location:    [facility] Room Number: 585 B Place of Service: SNF (31)   Extended Emergency Contact Information Primary Emergency Contact: Tokunaga,Bobby E Address: [address] [place], [place] [address] [place] Phone: [phone] Relation: Spouse Secondary Emergency Contact: Way,Cheryl Address: [address] [place] [address] [place] Phone: [phone] Relation: Daughter  Advanced Directive information Does patient have an advance directive?: Yes, Type of Advance Directive: Out of facility DNR (pink MOST or yellow form), Does patient want to make changes to advanced directive?: No - Patient declined  Chief Complaint  Patient presents with  . Altered Mental Status    HPI:  80 yo female long term resident seen today for bloody stools, N/V per nursing. Pt reports nausea but no emesis. No f/c. No sick contacts. Denies abdominal pain. She is a poor historian due to psych d/o. Hx obtained from chart and nursing. Appetite poor. Sleeps well. No falls. Last Hgb 10.3 in June 2017  Parkinson disease - unchanged. She takes sinemet 25/100 mg four times daily and requip 5 mg daily   Hx Afib - rate controlled on lopressor 12.5 mg daily and cardizem 60 mg daily. She takes ASA 81 mg daily   Hypertension - BP stable on lopressor 12.5 mg daily and cardizem 60 mg daily   Constipation - stable on senna s 2 tabs daily and colace twice daily   Edema - stable on torsemide 10 mg daily  Major depression with psychosis -  followed by [facility]. She takes prozac 60 mg daily; lamictal 150 mg daily to help stabilize mood; seroquel  100 mg in the PM   GERD - stable on prilosec 20 mg daily   Past Medical History:  Diagnosis Date  . A-fib ([place])   . Anemia   . Anxiety   . Chronic kidney disease   . Chronic pain   .  Constipation   . Depression   . Edema   . Gait disturbance   . Hypertension   . Hypokalemia   . Insomnia   . Intertrochanteric fracture ([place])   . Parkinson disease ([place])   . UTI (lower urinary tract infection)   . Vitamin D deficiency     Past Surgical History:  Procedure Laterality Date  . right hip surgery due to fracture      Patient Care Team: Hennie Duos, MD as PCP - General (Internal Medicine) Gerlene Fee, NP as Nurse Practitioner (Nurse Practitioner) [facility] ([place]  Social History   Social History  . Marital status: Married    Spouse name: N/A  . Number of children: N/A  . Years of education: N/A   Occupational History  . Not on file.   Social History Main Topics  . Smoking status: Never Smoker  . Smokeless tobacco: Never Used  . Alcohol use No  . Drug use: No  . Sexual activity: Not on file   Other Topics Concern  . Not on file   Social History Narrative  . No narrative on file     reports that she has never smoked. She has never used smokeless tobacco. She reports that she does not drink alcohol or use drugs.  Family History  Problem Relation Age of Onset  . Cancer Mother   . Cancer Father    Family Status  Relation Status  . Mother Deceased  . Father Deceased    Immunization History  Administered Date(s) Administered  . Influenza-Unspecified 04/28/2013, 10/09/2014, 02/03/2016  . PPD Test 03/16/2011    Allergies  Allergen Reactions  . Penicillins     Medications: Patient's Medications  New Prescriptions   No medications on file  Previous Medications   ACETAMINOPHEN (TYLENOL) 325 MG TABLET    Take 650 mg by mouth 3 (three) times daily.   ANTISEPTIC ORAL RINSE (BIOTENE) LIQD    15 mLs by Mouth Rinse route 2 (two) times daily.   ASPIRIN 81 MG TABLET    Take 81 mg by mouth daily.   CARBIDOPA-LEVODOPA (SINEMET IR) 25-100 MG PER TABLET    Take 1 tablet by mouth 4 (four) times daily. For  muscle spasms   CHOLECALCIFEROL (VITAMIN D) 1000 UNITS TABLET    Take 1,000 Units by mouth daily.   DEXTROMETHORPHAN 15 MG/5ML SYRUP    Take 10 mLs by mouth every 4 (four) hours as needed for cough. Reported on 05/06/2016   DICLOFENAC SODIUM (VOLTAREN) 1 % GEL    Apply 4 g topically 4 (four) times daily. To knees   DILTIAZEM (CARDIZEM) 60 MG TABLET    Take 60 mg by mouth daily.   DOCUSATE SODIUM (COLACE) 100 MG CAPSULE    Take 100 mg by mouth 2 (two) times daily.   FLUOXETINE (PROZAC) 20 MG CAPSULE    Take 60 mg by mouth daily. Take 3 tablets to equal 60 mg for anxiety.   LAMOTRIGINE (LAMICTAL) 150 MG TABLET    Take 150 mg by mouth daily.   MAGNESIUM HYDROXIDE (MILK OF MAGNESIA) 400 MG/5ML SUSPENSION    Take 30 mLs by mouth daily.   MELATONIN 3 MG CAPS    Take 6 mg by mouth at bedtime.   METOPROLOL TARTRATE (LOPRESSOR) 25 MG TABLET    Take 12.5 mg by mouth daily.    MULTIPLE VITAMIN (DAILY VITE) TABS    Take by mouth daily. Take 1 tablet   OMEPRAZOLE (PRILOSEC) 20 MG CAPSULE    Take 20 mg by mouth daily.   PROMETHAZINE (PHENERGAN) 25 MG TABLET    Take 25 mg by mouth every 6 (six) hours as needed for nausea or vomiting.   QUETIAPINE (SEROQUEL) 100 MG TABLET    Take 100 mg by mouth at bedtime. For major depressive disorder   ROPINIROLE (REQUIP) 5 MG TABLET    Take 5 mg by mouth at bedtime. For anxiety   SENNOSIDES-DOCUSATE SODIUM (SENOKOT-S) 8.6-50 MG TABLET    Take 2 tablets by mouth daily.   TORSEMIDE (DEMADEX) 10 MG TABLET    Take 10 mg by mouth daily.   UNABLE TO FIND    Med Name: Med Pass 120 cc two times daily  Modified Medications   No medications on file  Discontinued Medications   FUROSEMIDE (LASIX) 20 MG TABLET    Take 20 mg by mouth daily.    Review of Systems  Unable to perform ROS: Psychiatric disorder    Vitals:   08/19/16 1607  BP: 124/76  Pulse: 73  Resp: 17  Temp: 98.4 F (36.9 C)  TempSrc: Oral  SpO2: 98%  Weight: 115 lb (52.2 kg)  Height: 5' 2"  (1.575 m)    Body mass index is 21.03 kg/m.  Physical Exam  Constitutional: She appears  well-developed.  Frail appearing sitting up in bed in NAD  HENT:  Mouth/Throat: No oropharyngeal exudate.  MM dry  Eyes: Pupils are equal, round, and reactive to light. No scleral icterus.  Neck: Neck supple. Carotid bruit is not present. No tracheal deviation present. No thyromegaly present.  Cardiovascular: Normal rate, regular rhythm, normal heart sounds and intact distal pulses.  Frequent extrasystoles are present. Exam reveals no gallop and no friction rub.   No murmur heard. No LE edema b/l. no calf TTP.   Pulmonary/Chest: Effort normal and breath sounds normal. No stridor. No respiratory distress. She has no wheezes. She has no rales.  Abdominal: Soft. Bowel sounds are normal. She exhibits no distension and no mass. There is no hepatomegaly. There is no tenderness. There is no rebound and no guarding.  Musculoskeletal: She exhibits edema.  Lymphadenopathy:    She has no cervical adenopathy.  Neurological: She is alert. She displays tremor.  Skin: Skin is warm and dry. No rash noted.  Psychiatric: She has a normal mood and affect. Her behavior is normal.     Labs reviewed: Admission on 06/03/2016, Discharged on 06/03/2016  Component Date Value Ref Range Status  . WBC 06/03/2016 9.4  4.0 - 10.5 K/uL Final  . RBC 06/03/2016 3.93  3.87 - 5.11 MIL/uL Final  . Hemoglobin 06/03/2016 10.3* 12.0 - 15.0 g/dL Final  . HCT 06/03/2016 34.3* 36.0 - 46.0 % Final  . MCV 06/03/2016 87.3  78.0 - 100.0 fL Final  . MCH 06/03/2016 26.2  26.0 - 34.0 pg Final  . MCHC 06/03/2016 30.0  30.0 - 36.0 g/dL Final  . RDW 06/03/2016 16.6* 11.5 - 15.5 % Final  . Platelets 06/03/2016 425* 150 - 400 K/uL Final  . Neutrophils Relative % 06/03/2016 67  % Final  . Neutro Abs 06/03/2016 6.3  1.7 - 7.7 K/uL Final  . Lymphocytes Relative 06/03/2016 23  % Final  . Lymphs Abs 06/03/2016 2.1  0.7 - 4.0 K/uL Final  . Monocytes Relative  06/03/2016 6  % Final  . Monocytes Absolute 06/03/2016 0.6  0.1 - 1.0 K/uL Final  . Eosinophils Relative 06/03/2016 4  % Final  . Eosinophils Absolute 06/03/2016 0.4  0.0 - 0.7 K/uL Final  . Basophils Relative 06/03/2016 0  % Final  . Basophils Absolute 06/03/2016 0.0  0.0 - 0.1 K/uL Final  . Sodium 06/03/2016 137  135 - 145 mmol/L Final  . Potassium 06/03/2016 4.2  3.5 - 5.1 mmol/L Final  . Chloride 06/03/2016 105  101 - 111 mmol/L Final  . CO2 06/03/2016 26  22 - 32 mmol/L Final  . Glucose, Bld 06/03/2016 94  65 - 99 mg/dL Final  . BUN 06/03/2016 16  6 - 20 mg/dL Final  . Creatinine, Ser 06/03/2016 0.77  0.44 - 1.00 mg/dL Final  . Calcium 06/03/2016 9.4  8.9 - 10.3 mg/dL Final  . GFR calc non Af Amer 06/03/2016 >60  >60 mL/min Final  . GFR calc Af Amer 06/03/2016 >60  >60 mL/min Final   Comment: (NOTE) The eGFR has been calculated using the CKD EPI equation. This calculation has not been validated in all clinical situations. eGFR's persistently <60 mL/min signify possible Chronic Kidney Disease.   . Anion gap 06/03/2016 6  5 - 15 Final  . Troponin i, poc 06/03/2016 0.00  0.00 - 0.08 ng/mL Final  . Comment 3 06/03/2016          Final   Comment: Due to the release  kinetics of cTnI, a negative result within the first hours of the onset of symptoms does not rule out myocardial infarction with certainty. If myocardial infarction is still suspected, repeat the test at appropriate intervals.     No results found.   Assessment/Plan   ICD-9-CM ICD-10-CM   1. Nausea and vomiting, intractability of vomiting not specified, unspecified vomiting type 787.01 R11.2   2. Blood in stool 578.1 K92.1   3. Gastroesophageal reflux disease with esophagitis 530.11 K21.0   4. Anemia due to other cause 285.8 D64.89   5. Chronic constipation 564.00 K59.00   6. Depression, major, recurrent, severe with psychosis ([place]) 296.34 F33.3   7. Parkinson disease ([place]) 332.0 G20    Check CBC, BMP  Send  stool for guaic testing  Cont current meds as ordered  Cont nutritional supplements as ordered  Aspiration precautions  Will follow   Kasarah Sitts S. Perlie Gold  [facility] [address] [place], [place] [address] [phone] Cell (Monday-Friday 8 AM - 5 PM) [phone] After 5 PM and follow prompts

## 2016-08-20 DIAGNOSIS — Z79899 Other long term (current) drug therapy: Secondary | ICD-10-CM | POA: Diagnosis not present

## 2016-08-23 DIAGNOSIS — M6281 Muscle weakness (generalized): Secondary | ICD-10-CM | POA: Diagnosis not present

## 2016-08-23 DIAGNOSIS — G20 Parkinson's disease: Secondary | ICD-10-CM | POA: Diagnosis not present

## 2016-08-23 DIAGNOSIS — R1311 Dysphagia, oral phase: Secondary | ICD-10-CM | POA: Diagnosis not present

## 2016-08-23 DIAGNOSIS — R279 Unspecified lack of coordination: Secondary | ICD-10-CM | POA: Diagnosis not present

## 2016-08-24 DIAGNOSIS — M6281 Muscle weakness (generalized): Secondary | ICD-10-CM | POA: Diagnosis not present

## 2016-08-24 DIAGNOSIS — R279 Unspecified lack of coordination: Secondary | ICD-10-CM | POA: Diagnosis not present

## 2016-08-24 DIAGNOSIS — R1311 Dysphagia, oral phase: Secondary | ICD-10-CM | POA: Diagnosis not present

## 2016-08-24 DIAGNOSIS — G20 Parkinson's disease: Secondary | ICD-10-CM | POA: Diagnosis not present

## 2016-08-25 DIAGNOSIS — M6281 Muscle weakness (generalized): Secondary | ICD-10-CM | POA: Diagnosis not present

## 2016-08-25 DIAGNOSIS — R1311 Dysphagia, oral phase: Secondary | ICD-10-CM | POA: Diagnosis not present

## 2016-08-25 DIAGNOSIS — G20 Parkinson's disease: Secondary | ICD-10-CM | POA: Diagnosis not present

## 2016-08-25 DIAGNOSIS — R279 Unspecified lack of coordination: Secondary | ICD-10-CM | POA: Diagnosis not present

## 2016-08-26 ENCOUNTER — Encounter: Payer: Self-pay | Admitting: Internal Medicine

## 2016-08-26 ENCOUNTER — Non-Acute Institutional Stay (SKILLED_NURSING_FACILITY): Payer: Medicare Other | Admitting: Internal Medicine

## 2016-08-26 DIAGNOSIS — K2100 Gastro-esophageal reflux disease with esophagitis, without bleeding: Secondary | ICD-10-CM

## 2016-08-26 DIAGNOSIS — R1311 Dysphagia, oral phase: Secondary | ICD-10-CM | POA: Diagnosis not present

## 2016-08-26 DIAGNOSIS — I480 Paroxysmal atrial fibrillation: Secondary | ICD-10-CM

## 2016-08-26 DIAGNOSIS — F333 Major depressive disorder, recurrent, severe with psychotic symptoms: Secondary | ICD-10-CM

## 2016-08-26 DIAGNOSIS — D6489 Other specified anemias: Secondary | ICD-10-CM

## 2016-08-26 DIAGNOSIS — K5900 Constipation, unspecified: Secondary | ICD-10-CM | POA: Diagnosis not present

## 2016-08-26 DIAGNOSIS — K5909 Other constipation: Secondary | ICD-10-CM

## 2016-08-26 DIAGNOSIS — I10 Essential (primary) hypertension: Secondary | ICD-10-CM

## 2016-08-26 DIAGNOSIS — R279 Unspecified lack of coordination: Secondary | ICD-10-CM | POA: Diagnosis not present

## 2016-08-26 DIAGNOSIS — M1712 Unilateral primary osteoarthritis, left knee: Secondary | ICD-10-CM | POA: Diagnosis not present

## 2016-08-26 DIAGNOSIS — G20 Parkinson's disease: Secondary | ICD-10-CM | POA: Diagnosis not present

## 2016-08-26 DIAGNOSIS — R600 Localized edema: Secondary | ICD-10-CM

## 2016-08-26 DIAGNOSIS — M6281 Muscle weakness (generalized): Secondary | ICD-10-CM | POA: Diagnosis not present

## 2016-08-26 NOTE — Progress Notes (Signed)
Patient ID: [name] [name], female   DOB: 1936-08-13, 80 y.o.   MRN: [id number]    DATE: 08/26/16  Location:    [facility] Room Number: 786 B Place of Service: SNF (31)   Extended Emergency Contact Information Primary Emergency Contact: [name],[name] E Address: [address], [place] [address] [place] Phone: [phone] Relation: Spouse Secondary Emergency Contact: [name],[name] Address: [address] [place] [address] [place] Phone: [phone] Relation: Daughter  Advanced Directive information  FULL CODE  Chief Complaint  Patient presents with  . Medical Management of Chronic Issues    Routine Visit    HPI:  80 yo female long term resident seen today for f/u. She has no c/o. No further N/V. No bloody stools. No nursing concerns. Appetite poor. She gets nutritional supplements. No falls. She is a poor historian due to psych d/o. Hx obtained from chart.  Parkinson disease - unchanged. She takes sinemet 25/100 mg four times daily and requip 5 mg daily   Hx Afib - rate controlled on lopressor 12.5 mg daily and cardizem 60 mg daily. She takes ASA 81 mg daily   Hypertension - BP stable on lopressor 12.5 mg daily and cardizem 60 mg daily   Constipation - stable on senna s 2 tabs daily and colace twice daily   Edema - stable on torsemide 10 mg daily  Major depression with psychosis -  followed by [facility]PC. She takes prozac 60 mg daily; lamictal 150 mg daily to help stabilize mood; seroquel  100 mg in the PM   GERD - stable on prilosec 20 mg daily   Insomnia - stable on melatonin 6 mg nightly;    Left knee osteoarthritis - pain stable on  tylenol 650 mg three times daily;  voltaren gel 4 gm four times daily for her left knee  Hx anemia - Hgb 10.4 in July 2017  Past Medical History:  Diagnosis Date  . A-fib ([place])   . Anemia   . Anxiety   . Chronic kidney disease   . Chronic pain   .  Constipation   . Depression   . Edema   . Gait disturbance   . Hypertension   . Hypokalemia   . Insomnia   . Intertrochanteric fracture ([place])   . Parkinson disease ([place])   . UTI (lower urinary tract infection)   . Vitamin D deficiency     Past Surgical History:  Procedure Laterality Date  . right hip surgery due to fracture      Patient Care Team: Gildardo Cranker, DO as PCP - General (Internal Medicine) Gerlene Fee, NP as Nurse Practitioner (Nurse Practitioner) [facility]  Social History   Social History  . Marital status: Married    Spouse name: N/A  . Number of children: N/A  . Years of education: N/A   Occupational History  . Not on file.   Social History Main Topics  . Smoking status: Never Smoker  . Smokeless tobacco: Never Used  . Alcohol use No  . Drug use: No  . Sexual activity: Not on file   Other Topics Concern  . Not on file   Social History Narrative  . No narrative on file     reports that she has never smoked. She has never used smokeless tobacco.  She reports that she does not drink alcohol or use drugs.  Family History  Problem Relation Age of Onset  . Cancer Mother   . Cancer Father    Family Status  Relation Status  . Mother Deceased  . Father Deceased    Immunization History  Administered Date(s) Administered  . Influenza-Unspecified 04/28/2013, 10/09/2014, 02/03/2016  . PPD Test 03/16/2011  . Pneumococcal-Unspecified 04/28/2013    Allergies  Allergen Reactions  . Penicillins     Medications: Patient's Medications  New Prescriptions   No medications on file  Previous Medications   ACETAMINOPHEN (TYLENOL) 325 MG TABLET    Take 650 mg by mouth 3 (three) times daily.   ANTISEPTIC ORAL RINSE (BIOTENE) LIQD    15 mLs by Mouth Rinse route 2 (two) times daily.   ASPIRIN 81 MG TABLET    Take 81 mg by mouth daily.   CARBIDOPA-LEVODOPA (SINEMET IR) 25-100 MG PER TABLET    Take 1 tablet  by mouth 4 (four) times daily. For muscle spasms   CHOLECALCIFEROL (VITAMIN D) 1000 UNITS TABLET    Take 1,000 Units by mouth daily.   DEXTROMETHORPHAN 15 MG/5ML SYRUP    Take 10 mLs by mouth every 4 (four) hours as needed for cough. Reported on 05/06/2016   DICLOFENAC SODIUM (VOLTAREN) 1 % GEL    Apply 4 g topically 4 (four) times daily. To knees   DILTIAZEM (CARDIZEM) 60 MG TABLET    Take 60 mg by mouth daily.   DOCUSATE SODIUM (COLACE) 100 MG CAPSULE    Take 100 mg by mouth 2 (two) times daily.   FLUOXETINE (PROZAC) 20 MG CAPSULE    Take 60 mg by mouth daily. Take 3 tablets to equal 60 mg for anxiety.   LAMOTRIGINE (LAMICTAL) 150 MG TABLET    Take 150 mg by mouth daily.   MAGNESIUM HYDROXIDE (MILK OF MAGNESIA) 400 MG/5ML SUSPENSION    Take 30 mLs by mouth daily as needed.    MELATONIN 3 MG CAPS    Take 6 mg by mouth at bedtime.   METOPROLOL TARTRATE (LOPRESSOR) 25 MG TABLET    Take 12.5 mg by mouth daily.    MULTIPLE VITAMIN (DAILY VITE) TABS    Take by mouth daily. Take 1 tablet   OMEPRAZOLE (PRILOSEC) 20 MG CAPSULE    Take 20 mg by mouth daily.   PROMETHAZINE (PHENERGAN) 25 MG TABLET    Take 25 mg by mouth every 6 (six) hours as needed for nausea or vomiting.   QUETIAPINE (SEROQUEL) 100 MG TABLET    Take 100 mg by mouth at bedtime. For major depressive disorder   ROPINIROLE (REQUIP) 5 MG TABLET    Take 5 mg by mouth at bedtime. For anxiety   SENNOSIDES-DOCUSATE SODIUM (SENOKOT-S) 8.6-50 MG TABLET    Take 2 tablets by mouth daily.   TORSEMIDE (DEMADEX) 10 MG TABLET    Take 10 mg by mouth daily.   UNABLE TO FIND    Med Name: Med Pass 120 cc two times daily  Modified Medications   No medications on file  Discontinued Medications   No medications on file    Review of Systems  Unable to perform ROS: Psychiatric disorder    Vitals:   08/26/16 1200  BP: 120/72  Pulse: 76  Resp: 18  Temp: 97.6 F (36.4 C)  TempSrc: Oral  SpO2: 98%  Weight: 115 lb (52.2 kg)  Height: 5' 2"  (1.575 m)     Body mass index  is 21.03 kg/m.  Physical Exam  Constitutional: She appears well-developed.  Frail appearing sitting up in bed in NAD  HENT:  Mouth/Throat: No oropharyngeal exudate.  MM dry  Eyes: Pupils are equal, round, and reactive to light. No scleral icterus.  Neck: Neck supple. Carotid bruit is not present. No tracheal deviation present. No thyromegaly present.  Cardiovascular: Normal rate, normal heart sounds and intact distal pulses.  An irregularly irregular rhythm present.  No extrasystoles are present. Exam reveals no gallop and no friction rub.   No murmur heard. No LE edema b/l. no calf TTP.   Pulmonary/Chest: Effort normal and breath sounds normal. No stridor. No respiratory distress. She has no wheezes. She has no rales.  Abdominal: Soft. Bowel sounds are normal. She exhibits no distension and no mass. There is no hepatomegaly. There is no tenderness. There is no rebound and no guarding.  Musculoskeletal: She exhibits edema.  Lymphadenopathy:    She has no cervical adenopathy.  Neurological: She is alert. She displays tremor.  Skin: Skin is warm and dry. No rash noted.  Psychiatric: She has a normal mood and affect. Her behavior is normal. Thought content normal.     Labs reviewed: Nursing Home on 08/26/2016  Component Date Value Ref Range Status  . Glucose 07/15/2016 92  mg/dL Final  . BUN 07/15/2016 12  4 - 21 mg/dL Final  . Creatinine 07/15/2016 0.7  0.5 - 1.1 mg/dL Final  . Potassium 07/15/2016 4.6  3.4 - 5.3 mmol/L Final  . Sodium 07/15/2016 137  137 - 147 mmol/L Final  . Alkaline Phosphatase 07/15/2016 31  25 - 125 U/L Final  . ALT 07/15/2016 9  7 - 35 U/L Final  . AST 07/15/2016 91* 13 - 35 U/L Final  . Hemoglobin 07/15/2016 10.4* 12.0 - 16.0 g/dL Final  . HCT 07/15/2016 32* 36 - 46 % Final  . Platelets 07/15/2016 470* 150 - 399 K/L Final  . WBC 07/15/2016 8.8  10^3/mL Final  Admission on 06/03/2016, Discharged on 06/03/2016  Component Date Value Ref  Range Status  . WBC 06/03/2016 9.4  4.0 - 10.5 K/uL Final  . RBC 06/03/2016 3.93  3.87 - 5.11 MIL/uL Final  . Hemoglobin 06/03/2016 10.3* 12.0 - 15.0 g/dL Final  . HCT 06/03/2016 34.3* 36.0 - 46.0 % Final  . MCV 06/03/2016 87.3  78.0 - 100.0 fL Final  . MCH 06/03/2016 26.2  26.0 - 34.0 pg Final  . MCHC 06/03/2016 30.0  30.0 - 36.0 g/dL Final  . RDW 06/03/2016 16.6* 11.5 - 15.5 % Final  . Platelets 06/03/2016 425* 150 - 400 K/uL Final  . Neutrophils Relative % 06/03/2016 67  % Final  . Neutro Abs 06/03/2016 6.3  1.7 - 7.7 K/uL Final  . Lymphocytes Relative 06/03/2016 23  % Final  . Lymphs Abs 06/03/2016 2.1  0.7 - 4.0 K/uL Final  . Monocytes Relative 06/03/2016 6  % Final  . Monocytes Absolute 06/03/2016 0.6  0.1 - 1.0 K/uL Final  . Eosinophils Relative 06/03/2016 4  % Final  . Eosinophils Absolute 06/03/2016 0.4  0.0 - 0.7 K/uL Final  . Basophils Relative 06/03/2016 0  % Final  . Basophils Absolute 06/03/2016 0.0  0.0 - 0.1 K/uL Final  . Sodium 06/03/2016 137  135 - 145 mmol/L Final  . Potassium 06/03/2016 4.2  3.5 - 5.1 mmol/L Final  . Chloride 06/03/2016 105  101 - 111 mmol/L Final  . CO2 06/03/2016 26  22 - 32 mmol/L  Final  . Glucose, Bld 06/03/2016 94  65 - 99 mg/dL Final  . BUN 06/03/2016 16  6 - 20 mg/dL Final  . Creatinine, Ser 06/03/2016 0.77  0.44 - 1.00 mg/dL Final  . Calcium 06/03/2016 9.4  8.9 - 10.3 mg/dL Final  . GFR calc non Af Amer 06/03/2016 >60  >60 mL/min Final  . GFR calc Af Amer 06/03/2016 >60  >60 mL/min Final   Comment: (NOTE) The eGFR has been calculated using the CKD EPI equation. This calculation has not been validated in all clinical situations. eGFR's persistently <60 mL/min signify possible Chronic Kidney Disease.   . Anion gap 06/03/2016 6  5 - 15 Final  . Troponin i, poc 06/03/2016 0.00  0.00 - 0.08 ng/mL Final  . Comment 3 06/03/2016          Final   Comment: Due to the release kinetics of cTnI, a negative result within the first hours of the  onset of symptoms does not rule out myocardial infarction with certainty. If myocardial infarction is still suspected, repeat the test at appropriate intervals.     No results found.   Assessment/Plan   ICD-9-CM ICD-10-CM   1. Parkinson disease ([place]) 332.0 G20   2. Paroxysmal atrial fibrillation (HCC) 427.31 I48.0   3. Depression, major, recurrent, severe with psychosis ([place]) 296.34 F33.3   4. Anemia due to other cause 285.8 D64.89   5. Gastroesophageal reflux disease with esophagitis 530.11 K21.0   6. Essential hypertension, benign 401.1 I10   7. Chronic constipation 564.00 K59.00   8. Bilateral lower extremity edema 782.3 R60.0   9. Primary osteoarthritis of left knee 715.16 M17.12    Labs already ordered (cbc, bmp). Await results  Cont current meds as ordered  Psych services to follow  PT/OT/ST as indicated  Cont nutritional supplement as ordered  Will follow  Marquavis Hannen S. Perlie Gold  [facility] [address] [place],  [address] [phone] Cell (Monday-Friday 8 AM - 5 PM) [phone] After 5 PM and follow prompts

## 2016-08-27 DIAGNOSIS — R279 Unspecified lack of coordination: Secondary | ICD-10-CM | POA: Diagnosis not present

## 2016-08-27 DIAGNOSIS — R1311 Dysphagia, oral phase: Secondary | ICD-10-CM | POA: Diagnosis not present

## 2016-08-27 DIAGNOSIS — G20 Parkinson's disease: Secondary | ICD-10-CM | POA: Diagnosis not present

## 2016-08-27 DIAGNOSIS — M6281 Muscle weakness (generalized): Secondary | ICD-10-CM | POA: Diagnosis not present

## 2016-08-30 DIAGNOSIS — R279 Unspecified lack of coordination: Secondary | ICD-10-CM | POA: Diagnosis not present

## 2016-08-30 DIAGNOSIS — R1311 Dysphagia, oral phase: Secondary | ICD-10-CM | POA: Diagnosis not present

## 2016-08-30 DIAGNOSIS — G20 Parkinson's disease: Secondary | ICD-10-CM | POA: Diagnosis not present

## 2016-08-30 DIAGNOSIS — M6281 Muscle weakness (generalized): Secondary | ICD-10-CM | POA: Diagnosis not present

## 2016-08-31 DIAGNOSIS — G20 Parkinson's disease: Secondary | ICD-10-CM | POA: Diagnosis not present

## 2016-08-31 DIAGNOSIS — M6281 Muscle weakness (generalized): Secondary | ICD-10-CM | POA: Diagnosis not present

## 2016-08-31 DIAGNOSIS — E559 Vitamin D deficiency, unspecified: Secondary | ICD-10-CM | POA: Diagnosis not present

## 2016-08-31 DIAGNOSIS — G218 Other secondary parkinsonism: Secondary | ICD-10-CM | POA: Diagnosis not present

## 2016-08-31 DIAGNOSIS — R279 Unspecified lack of coordination: Secondary | ICD-10-CM | POA: Diagnosis not present

## 2016-08-31 DIAGNOSIS — I120 Hypertensive chronic kidney disease with stage 5 chronic kidney disease or end stage renal disease: Secondary | ICD-10-CM | POA: Diagnosis not present

## 2016-08-31 DIAGNOSIS — R1311 Dysphagia, oral phase: Secondary | ICD-10-CM | POA: Diagnosis not present

## 2016-08-31 DIAGNOSIS — I4891 Unspecified atrial fibrillation: Secondary | ICD-10-CM | POA: Diagnosis not present

## 2016-08-31 LAB — BASIC METABOLIC PANEL
BUN: 12 mg/dL (ref 4–21)
Creatinine: 0.7 mg/dL (ref 0.5–1.1)
Glucose: 130 mg/dL
Potassium: 4.5 mmol/L (ref 3.4–5.3)
Sodium: 139 mmol/L (ref 137–147)

## 2016-08-31 LAB — CBC AND DIFFERENTIAL
HEMATOCRIT: 37 % (ref 36–46)
HEMOGLOBIN: 10.7 g/dL — AB (ref 12.0–16.0)
PLATELETS: 466 10*3/uL — AB (ref 150–399)
WBC: 8.8 10*3/mL

## 2016-09-01 DIAGNOSIS — G20 Parkinson's disease: Secondary | ICD-10-CM | POA: Diagnosis not present

## 2016-09-01 DIAGNOSIS — M6281 Muscle weakness (generalized): Secondary | ICD-10-CM | POA: Diagnosis not present

## 2016-09-01 DIAGNOSIS — R279 Unspecified lack of coordination: Secondary | ICD-10-CM | POA: Diagnosis not present

## 2016-09-01 DIAGNOSIS — R1311 Dysphagia, oral phase: Secondary | ICD-10-CM | POA: Diagnosis not present

## 2016-09-02 DIAGNOSIS — I4891 Unspecified atrial fibrillation: Secondary | ICD-10-CM | POA: Diagnosis not present

## 2016-09-02 DIAGNOSIS — R279 Unspecified lack of coordination: Secondary | ICD-10-CM | POA: Diagnosis not present

## 2016-09-02 DIAGNOSIS — H02839 Dermatochalasis of unspecified eye, unspecified eyelid: Secondary | ICD-10-CM | POA: Diagnosis not present

## 2016-09-02 DIAGNOSIS — N390 Urinary tract infection, site not specified: Secondary | ICD-10-CM | POA: Diagnosis not present

## 2016-09-02 DIAGNOSIS — Z961 Presence of intraocular lens: Secondary | ICD-10-CM | POA: Diagnosis not present

## 2016-09-02 DIAGNOSIS — M6281 Muscle weakness (generalized): Secondary | ICD-10-CM | POA: Diagnosis not present

## 2016-09-02 DIAGNOSIS — G20 Parkinson's disease: Secondary | ICD-10-CM | POA: Diagnosis not present

## 2016-09-02 DIAGNOSIS — R1311 Dysphagia, oral phase: Secondary | ICD-10-CM | POA: Diagnosis not present

## 2016-09-02 DIAGNOSIS — H04123 Dry eye syndrome of bilateral lacrimal glands: Secondary | ICD-10-CM | POA: Diagnosis not present

## 2016-09-02 DIAGNOSIS — I120 Hypertensive chronic kidney disease with stage 5 chronic kidney disease or end stage renal disease: Secondary | ICD-10-CM | POA: Diagnosis not present

## 2016-09-02 DIAGNOSIS — E559 Vitamin D deficiency, unspecified: Secondary | ICD-10-CM | POA: Diagnosis not present

## 2016-09-02 DIAGNOSIS — G218 Other secondary parkinsonism: Secondary | ICD-10-CM | POA: Diagnosis not present

## 2016-09-02 DIAGNOSIS — R319 Hematuria, unspecified: Secondary | ICD-10-CM | POA: Diagnosis not present

## 2016-09-03 DIAGNOSIS — G20 Parkinson's disease: Secondary | ICD-10-CM | POA: Diagnosis not present

## 2016-09-03 DIAGNOSIS — M6281 Muscle weakness (generalized): Secondary | ICD-10-CM | POA: Diagnosis not present

## 2016-09-03 DIAGNOSIS — R1311 Dysphagia, oral phase: Secondary | ICD-10-CM | POA: Diagnosis not present

## 2016-09-03 DIAGNOSIS — R279 Unspecified lack of coordination: Secondary | ICD-10-CM | POA: Diagnosis not present

## 2016-09-06 DIAGNOSIS — R279 Unspecified lack of coordination: Secondary | ICD-10-CM | POA: Diagnosis not present

## 2016-09-06 DIAGNOSIS — G20 Parkinson's disease: Secondary | ICD-10-CM | POA: Diagnosis not present

## 2016-09-06 DIAGNOSIS — R1311 Dysphagia, oral phase: Secondary | ICD-10-CM | POA: Diagnosis not present

## 2016-09-06 DIAGNOSIS — M6281 Muscle weakness (generalized): Secondary | ICD-10-CM | POA: Diagnosis not present

## 2016-09-07 DIAGNOSIS — G20 Parkinson's disease: Secondary | ICD-10-CM | POA: Diagnosis not present

## 2016-09-07 DIAGNOSIS — R1311 Dysphagia, oral phase: Secondary | ICD-10-CM | POA: Diagnosis not present

## 2016-09-07 DIAGNOSIS — M6281 Muscle weakness (generalized): Secondary | ICD-10-CM | POA: Diagnosis not present

## 2016-09-07 DIAGNOSIS — R279 Unspecified lack of coordination: Secondary | ICD-10-CM | POA: Diagnosis not present

## 2016-09-08 DIAGNOSIS — R279 Unspecified lack of coordination: Secondary | ICD-10-CM | POA: Diagnosis not present

## 2016-09-08 DIAGNOSIS — M6281 Muscle weakness (generalized): Secondary | ICD-10-CM | POA: Diagnosis not present

## 2016-09-08 DIAGNOSIS — G20 Parkinson's disease: Secondary | ICD-10-CM | POA: Diagnosis not present

## 2016-09-08 DIAGNOSIS — R1311 Dysphagia, oral phase: Secondary | ICD-10-CM | POA: Diagnosis not present

## 2016-09-09 DIAGNOSIS — R1311 Dysphagia, oral phase: Secondary | ICD-10-CM | POA: Diagnosis not present

## 2016-09-09 DIAGNOSIS — M6281 Muscle weakness (generalized): Secondary | ICD-10-CM | POA: Diagnosis not present

## 2016-09-09 DIAGNOSIS — R279 Unspecified lack of coordination: Secondary | ICD-10-CM | POA: Diagnosis not present

## 2016-09-09 DIAGNOSIS — G20 Parkinson's disease: Secondary | ICD-10-CM | POA: Diagnosis not present

## 2016-09-10 DIAGNOSIS — R279 Unspecified lack of coordination: Secondary | ICD-10-CM | POA: Diagnosis not present

## 2016-09-10 DIAGNOSIS — R1311 Dysphagia, oral phase: Secondary | ICD-10-CM | POA: Diagnosis not present

## 2016-09-10 DIAGNOSIS — M6281 Muscle weakness (generalized): Secondary | ICD-10-CM | POA: Diagnosis not present

## 2016-09-10 DIAGNOSIS — G20 Parkinson's disease: Secondary | ICD-10-CM | POA: Diagnosis not present

## 2016-09-13 DIAGNOSIS — G20 Parkinson's disease: Secondary | ICD-10-CM | POA: Diagnosis not present

## 2016-09-13 DIAGNOSIS — R1311 Dysphagia, oral phase: Secondary | ICD-10-CM | POA: Diagnosis not present

## 2016-09-13 DIAGNOSIS — M6281 Muscle weakness (generalized): Secondary | ICD-10-CM | POA: Diagnosis not present

## 2016-09-13 DIAGNOSIS — R279 Unspecified lack of coordination: Secondary | ICD-10-CM | POA: Diagnosis not present

## 2016-09-14 DIAGNOSIS — R1311 Dysphagia, oral phase: Secondary | ICD-10-CM | POA: Diagnosis not present

## 2016-09-14 DIAGNOSIS — M6281 Muscle weakness (generalized): Secondary | ICD-10-CM | POA: Diagnosis not present

## 2016-09-14 DIAGNOSIS — R279 Unspecified lack of coordination: Secondary | ICD-10-CM | POA: Diagnosis not present

## 2016-09-14 DIAGNOSIS — G20 Parkinson's disease: Secondary | ICD-10-CM | POA: Diagnosis not present

## 2016-09-15 DIAGNOSIS — R1311 Dysphagia, oral phase: Secondary | ICD-10-CM | POA: Diagnosis not present

## 2016-09-15 DIAGNOSIS — G20 Parkinson's disease: Secondary | ICD-10-CM | POA: Diagnosis not present

## 2016-09-15 DIAGNOSIS — R279 Unspecified lack of coordination: Secondary | ICD-10-CM | POA: Diagnosis not present

## 2016-09-15 DIAGNOSIS — M6281 Muscle weakness (generalized): Secondary | ICD-10-CM | POA: Diagnosis not present

## 2016-09-16 DIAGNOSIS — G20 Parkinson's disease: Secondary | ICD-10-CM | POA: Diagnosis not present

## 2016-09-16 DIAGNOSIS — R1311 Dysphagia, oral phase: Secondary | ICD-10-CM | POA: Diagnosis not present

## 2016-09-16 DIAGNOSIS — R279 Unspecified lack of coordination: Secondary | ICD-10-CM | POA: Diagnosis not present

## 2016-09-16 DIAGNOSIS — M6281 Muscle weakness (generalized): Secondary | ICD-10-CM | POA: Diagnosis not present

## 2016-09-28 ENCOUNTER — Encounter: Payer: Self-pay | Admitting: Adult Health

## 2016-09-28 ENCOUNTER — Non-Acute Institutional Stay (SKILLED_NURSING_FACILITY): Payer: Medicare Other | Admitting: Adult Health

## 2016-09-28 DIAGNOSIS — K5909 Other constipation: Secondary | ICD-10-CM

## 2016-09-28 DIAGNOSIS — R279 Unspecified lack of coordination: Secondary | ICD-10-CM | POA: Diagnosis not present

## 2016-09-28 DIAGNOSIS — I482 Chronic atrial fibrillation, unspecified: Secondary | ICD-10-CM

## 2016-09-28 DIAGNOSIS — R600 Localized edema: Secondary | ICD-10-CM

## 2016-09-28 DIAGNOSIS — G20 Parkinson's disease: Secondary | ICD-10-CM

## 2016-09-28 DIAGNOSIS — K2100 Gastro-esophageal reflux disease with esophagitis, without bleeding: Secondary | ICD-10-CM

## 2016-09-28 DIAGNOSIS — R1311 Dysphagia, oral phase: Secondary | ICD-10-CM | POA: Diagnosis not present

## 2016-09-28 DIAGNOSIS — M1712 Unilateral primary osteoarthritis, left knee: Secondary | ICD-10-CM

## 2016-09-28 DIAGNOSIS — F333 Major depressive disorder, recurrent, severe with psychotic symptoms: Secondary | ICD-10-CM | POA: Diagnosis not present

## 2016-09-28 DIAGNOSIS — I10 Essential (primary) hypertension: Secondary | ICD-10-CM

## 2016-09-28 DIAGNOSIS — M6281 Muscle weakness (generalized): Secondary | ICD-10-CM | POA: Diagnosis not present

## 2016-09-28 NOTE — Progress Notes (Signed)
Patient ID: [name] [name], female   DOB: [birth year], 80 y.o.   MRN: [id number]   Location:   [facility] Room Number: 221-B Place of Service:  SNF (31)   CODE STATUS: Full Code  Allergies  Allergen Reactions  . Penicillins     Chief Complaint  Patient presents with  . Medical Management of Chronic Issues    Follow up    HPI:  She is a long term resident of this facility being seen for the management of her chronic illnesses. She has gotten out of bed twice this past week. She is not voicing any complaints at this time. There are no nursing concerns at this time. She has been treated for an uti this past month.    Past Medical History:  Diagnosis Date  . A-fib ([place])   . Anemia   . Anxiety   . Chronic kidney disease   . Chronic pain   . Constipation   . Depression   . Edema   . Gait disturbance   . Hypertension   . Hypokalemia   . Insomnia   . Intertrochanteric fracture ([place])   . Parkinson disease ([place])   . UTI (lower urinary tract infection)   . Vitamin D deficiency     Past Surgical History:  Procedure Laterality Date  . right hip surgery due to fracture      Social History   Social History  . Marital status: Married    Spouse name: N/A  . Number of children: N/A  . Years of education: N/A   Occupational History  . Not on file.   Social History Main Topics  . Smoking status: Never Smoker  . Smokeless tobacco: Never Used  . Alcohol use No  . Drug use: No  . Sexual activity: Not on file   Other Topics Concern  . Not on file   Social History Narrative  . No narrative on file   Family History  Problem Relation Age of Onset  . Cancer Mother   . Cancer Father       VITAL SIGNS BP 128/66   Pulse 88   Temp 98.2 F (36.8 C) (Oral)   Resp 18   Ht 5\' 2"  (1.575 m)   Wt 116 lb 6 oz (52.8 kg)   SpO2 96%   BMI 21.29 kg/m   Patient's Medications  New Prescriptions   No medications on file  Previous Medications   ACETAMINOPHEN  (TYLENOL) 325 MG TABLET    Take 650 mg by mouth 3 (three) times daily.   ANTISEPTIC ORAL RINSE (BIOTENE) LIQD    15 mLs by Mouth Rinse route 2 (two) times daily.   ASPIRIN 81 MG TABLET    Take 81 mg by mouth daily.   CARBIDOPA-LEVODOPA (SINEMET IR) 25-100 MG PER TABLET    Take 1 tablet by mouth 4 (four) times daily. For muscle spasms   CHOLECALCIFEROL (VITAMIN D) 1000 UNITS TABLET    Take 1,000 Units by mouth daily.   DEXTROMETHORPHAN 15 MG/5ML SYRUP    Take 10 mLs by mouth every 4 (four) hours as needed for cough. Reported on 05/06/2016   DICLOFENAC SODIUM (VOLTAREN) 1 % GEL    Apply 4 g topically 4 (four) times daily. To knees   DILTIAZEM (CARDIZEM) 60 MG TABLET    Take 60 mg by mouth daily.   DOCUSATE SODIUM (COLACE) 100 MG CAPSULE    Take 100 mg by mouth 2 (two) times daily.  FLUOXETINE (PROZAC) 20 MG CAPSULE    Take 60 mg by mouth daily. Take 3 tablets to equal 60 mg for anxiety.   LAMOTRIGINE (LAMICTAL) 150 MG TABLET    Take 150 mg by mouth daily.   MAGNESIUM HYDROXIDE (MILK OF MAGNESIA) 400 MG/5ML SUSPENSION    Take 30 mLs by mouth daily as needed.    MELATONIN 3 MG CAPS    Take 6 mg by mouth at bedtime.   METOPROLOL TARTRATE (LOPRESSOR) 25 MG TABLET    Take 12.5 mg by mouth daily.    MULTIPLE VITAMIN (DAILY VITE) TABS    Take by mouth daily. Take 1 tablet   OMEPRAZOLE (PRILOSEC) 20 MG CAPSULE    Take 20 mg by mouth daily.   PROMETHAZINE (PHENERGAN) 25 MG TABLET    Take 25 mg by mouth every 6 (six) hours as needed for nausea or vomiting.   QUETIAPINE (SEROQUEL) 100 MG TABLET    Take 100 mg by mouth at bedtime. For major depressive disorder   ROPINIROLE (REQUIP) 5 MG TABLET    Take 5 mg by mouth at bedtime. For anxiety   SENNOSIDES-DOCUSATE SODIUM (SENOKOT-S) 8.6-50 MG TABLET    Take 2 tablets by mouth daily.   TORSEMIDE (DEMADEX) 10 MG TABLET    Take 10 mg by mouth daily.   UNABLE TO FIND    Med Name: Med Pass 120 cc two times daily  Modified Medications   No medications on file    Discontinued Medications   No medications on file     SIGNIFICANT DIAGNOSTIC EXAMS  03-26-15: left knee x-ray: modest osteoarthritis  03-26-15: left hip with pelvis x-ray; modest osteoarthritis left hip   06-03-16: chest x-ray: Cardiomegaly.  No acute abnormality   LABS REVIEWED:   12-18-15: urine culture: e-coli: cipro  04-12-16: wbc 8.2; hgb 10.0; hct 33.0; mcv 85.6; plt 401; glucose 85; bun 17.6; creat 0.85; k+ 4.8; na++ 139  06-03-16: wbc 9.4; hgb 10.3; hct 34.3; mcv 87.3; plt 425; glucose 94; bun 16; creat 0.77; k+ 4.2; na++ 137  08-31-16: wbc 8.8; hgb 10.7; hct 36.7; mcv 83.6; plt 466; glucose 130; bun 13.0; creat 0.70; k+ 4.5; na++ 139 09-02-16: urine culture: gram neg rods no predominant micro-organisms: rocephin      Review of Systems  Constitutional: Negative for malaise/fatigue.  Respiratory: Negative for cough and shortness of breath.   Cardiovascular: Negative for chest pain, palpitations and leg swelling.  Gastrointestinal: negative for heartburn no nausea; no constipation  Musculoskeletal: Negative for myalgias, back pain and joint pain.  Skin: Negative.   Neurological: Negative for dizziness.  Psychiatric/Behavioral: The patient is not nervous/anxious.        Physical Exam Constitutional: No distress.  Thin   Neck: Neck supple. No JVD present. No thyromegaly present.  Breast exam negative  Cardiovascular: Normal rate and intact distal pulses.   Heart rate irregular   Respiratory: lungs clear; no respiratory distress  GI: Soft. She exhibits no distension.  Musculoskeletal: She exhibits no edema.  Is able to move all extremities Has bilateral resting tremors present   Neurological: She is alert.  Skin: Skin is warm and dry. She is not diaphoretic.      ASSESSMENT/ PLAN:  1. Parkinson disease: no change in status will continue sinemet 25/100 mg four times daily and requip 5 mg daily and will monitor   2. Afib: heart rate is stable will continue  lopressor 12.5 mg daily and cardizem 60 mg daily for rate control and  asa 81 mg daily   3. Hypertension: is stable will continue lopressor 12.5 mg daily and cardizem 60 mg daily   4. Constipation: will continue senna s 2 tabs daily  and colace twice daily   5. Edema: is stable will continue demadex 10 mg daily  6. Major depression: she is followed by [facility]; will continue prozac 60 mg daily; lamictal 150 mg daily to help stabilize mood;  Will continue seroquel  100 mg in the PM will monitor  7. Insomnia: is without change : will continue melatonin 6 mg nightly;    8. Left knee osteoarthritis: will continue  tylenol 650 mg three times daily  voltaren gel 4 gm four times daily for her knees  will monitor   9. Gerd:  Will continue prilosec 20 mg daily     Ok Edwards NP [facility]  Contact [phone] Monday through Friday 8am- 5pm  After hours call [phone]

## 2016-09-29 DIAGNOSIS — R279 Unspecified lack of coordination: Secondary | ICD-10-CM | POA: Diagnosis not present

## 2016-09-29 DIAGNOSIS — M6281 Muscle weakness (generalized): Secondary | ICD-10-CM | POA: Diagnosis not present

## 2016-09-29 DIAGNOSIS — R1311 Dysphagia, oral phase: Secondary | ICD-10-CM | POA: Diagnosis not present

## 2016-09-29 DIAGNOSIS — G20 Parkinson's disease: Secondary | ICD-10-CM | POA: Diagnosis not present

## 2016-09-30 DIAGNOSIS — R279 Unspecified lack of coordination: Secondary | ICD-10-CM | POA: Diagnosis not present

## 2016-09-30 DIAGNOSIS — M6281 Muscle weakness (generalized): Secondary | ICD-10-CM | POA: Diagnosis not present

## 2016-09-30 DIAGNOSIS — G20 Parkinson's disease: Secondary | ICD-10-CM | POA: Diagnosis not present

## 2016-09-30 DIAGNOSIS — R1311 Dysphagia, oral phase: Secondary | ICD-10-CM | POA: Diagnosis not present

## 2016-10-01 DIAGNOSIS — R279 Unspecified lack of coordination: Secondary | ICD-10-CM | POA: Diagnosis not present

## 2016-10-01 DIAGNOSIS — M6281 Muscle weakness (generalized): Secondary | ICD-10-CM | POA: Diagnosis not present

## 2016-10-01 DIAGNOSIS — G20 Parkinson's disease: Secondary | ICD-10-CM | POA: Diagnosis not present

## 2016-10-01 DIAGNOSIS — R1311 Dysphagia, oral phase: Secondary | ICD-10-CM | POA: Diagnosis not present

## 2016-10-02 DIAGNOSIS — G20 Parkinson's disease: Secondary | ICD-10-CM | POA: Diagnosis not present

## 2016-10-02 DIAGNOSIS — R1311 Dysphagia, oral phase: Secondary | ICD-10-CM | POA: Diagnosis not present

## 2016-10-02 DIAGNOSIS — R279 Unspecified lack of coordination: Secondary | ICD-10-CM | POA: Diagnosis not present

## 2016-10-02 DIAGNOSIS — M6281 Muscle weakness (generalized): Secondary | ICD-10-CM | POA: Diagnosis not present

## 2016-10-04 DIAGNOSIS — M6281 Muscle weakness (generalized): Secondary | ICD-10-CM | POA: Diagnosis not present

## 2016-10-04 DIAGNOSIS — G20 Parkinson's disease: Secondary | ICD-10-CM | POA: Diagnosis not present

## 2016-10-04 DIAGNOSIS — R279 Unspecified lack of coordination: Secondary | ICD-10-CM | POA: Diagnosis not present

## 2016-10-04 DIAGNOSIS — R1311 Dysphagia, oral phase: Secondary | ICD-10-CM | POA: Diagnosis not present

## 2016-10-05 DIAGNOSIS — M6281 Muscle weakness (generalized): Secondary | ICD-10-CM | POA: Diagnosis not present

## 2016-10-05 DIAGNOSIS — R1311 Dysphagia, oral phase: Secondary | ICD-10-CM | POA: Diagnosis not present

## 2016-10-05 DIAGNOSIS — G20 Parkinson's disease: Secondary | ICD-10-CM | POA: Diagnosis not present

## 2016-10-05 DIAGNOSIS — R279 Unspecified lack of coordination: Secondary | ICD-10-CM | POA: Diagnosis not present

## 2016-10-06 DIAGNOSIS — G20 Parkinson's disease: Secondary | ICD-10-CM | POA: Diagnosis not present

## 2016-10-06 DIAGNOSIS — R279 Unspecified lack of coordination: Secondary | ICD-10-CM | POA: Diagnosis not present

## 2016-10-06 DIAGNOSIS — R1311 Dysphagia, oral phase: Secondary | ICD-10-CM | POA: Diagnosis not present

## 2016-10-06 DIAGNOSIS — M6281 Muscle weakness (generalized): Secondary | ICD-10-CM | POA: Diagnosis not present

## 2016-10-07 DIAGNOSIS — R279 Unspecified lack of coordination: Secondary | ICD-10-CM | POA: Diagnosis not present

## 2016-10-07 DIAGNOSIS — G20 Parkinson's disease: Secondary | ICD-10-CM | POA: Diagnosis not present

## 2016-10-07 DIAGNOSIS — R1311 Dysphagia, oral phase: Secondary | ICD-10-CM | POA: Diagnosis not present

## 2016-10-07 DIAGNOSIS — M6281 Muscle weakness (generalized): Secondary | ICD-10-CM | POA: Diagnosis not present

## 2016-10-08 DIAGNOSIS — M6281 Muscle weakness (generalized): Secondary | ICD-10-CM | POA: Diagnosis not present

## 2016-10-08 DIAGNOSIS — R279 Unspecified lack of coordination: Secondary | ICD-10-CM | POA: Diagnosis not present

## 2016-10-08 DIAGNOSIS — G20 Parkinson's disease: Secondary | ICD-10-CM | POA: Diagnosis not present

## 2016-10-08 DIAGNOSIS — R1311 Dysphagia, oral phase: Secondary | ICD-10-CM | POA: Diagnosis not present

## 2016-10-11 DIAGNOSIS — R279 Unspecified lack of coordination: Secondary | ICD-10-CM | POA: Diagnosis not present

## 2016-10-11 DIAGNOSIS — G20 Parkinson's disease: Secondary | ICD-10-CM | POA: Diagnosis not present

## 2016-10-11 DIAGNOSIS — R1311 Dysphagia, oral phase: Secondary | ICD-10-CM | POA: Diagnosis not present

## 2016-10-11 DIAGNOSIS — M6281 Muscle weakness (generalized): Secondary | ICD-10-CM | POA: Diagnosis not present

## 2016-10-12 DIAGNOSIS — R1311 Dysphagia, oral phase: Secondary | ICD-10-CM | POA: Diagnosis not present

## 2016-10-12 DIAGNOSIS — R279 Unspecified lack of coordination: Secondary | ICD-10-CM | POA: Diagnosis not present

## 2016-10-12 DIAGNOSIS — M6281 Muscle weakness (generalized): Secondary | ICD-10-CM | POA: Diagnosis not present

## 2016-10-12 DIAGNOSIS — G20 Parkinson's disease: Secondary | ICD-10-CM | POA: Diagnosis not present

## 2016-10-13 DIAGNOSIS — M6281 Muscle weakness (generalized): Secondary | ICD-10-CM | POA: Diagnosis not present

## 2016-10-13 DIAGNOSIS — G20 Parkinson's disease: Secondary | ICD-10-CM | POA: Diagnosis not present

## 2016-10-13 DIAGNOSIS — R1311 Dysphagia, oral phase: Secondary | ICD-10-CM | POA: Diagnosis not present

## 2016-10-13 DIAGNOSIS — R279 Unspecified lack of coordination: Secondary | ICD-10-CM | POA: Diagnosis not present

## 2016-10-14 DIAGNOSIS — G20 Parkinson's disease: Secondary | ICD-10-CM | POA: Diagnosis not present

## 2016-10-14 DIAGNOSIS — R279 Unspecified lack of coordination: Secondary | ICD-10-CM | POA: Diagnosis not present

## 2016-10-14 DIAGNOSIS — M6281 Muscle weakness (generalized): Secondary | ICD-10-CM | POA: Diagnosis not present

## 2016-10-14 DIAGNOSIS — R1311 Dysphagia, oral phase: Secondary | ICD-10-CM | POA: Diagnosis not present

## 2016-10-15 DIAGNOSIS — R1311 Dysphagia, oral phase: Secondary | ICD-10-CM | POA: Diagnosis not present

## 2016-10-15 DIAGNOSIS — M6281 Muscle weakness (generalized): Secondary | ICD-10-CM | POA: Diagnosis not present

## 2016-10-15 DIAGNOSIS — R279 Unspecified lack of coordination: Secondary | ICD-10-CM | POA: Diagnosis not present

## 2016-10-15 DIAGNOSIS — G20 Parkinson's disease: Secondary | ICD-10-CM | POA: Diagnosis not present

## 2016-10-18 DIAGNOSIS — R1311 Dysphagia, oral phase: Secondary | ICD-10-CM | POA: Diagnosis not present

## 2016-10-18 DIAGNOSIS — M6281 Muscle weakness (generalized): Secondary | ICD-10-CM | POA: Diagnosis not present

## 2016-10-18 DIAGNOSIS — R279 Unspecified lack of coordination: Secondary | ICD-10-CM | POA: Diagnosis not present

## 2016-10-18 DIAGNOSIS — G20 Parkinson's disease: Secondary | ICD-10-CM | POA: Diagnosis not present

## 2016-10-19 DIAGNOSIS — M6281 Muscle weakness (generalized): Secondary | ICD-10-CM | POA: Diagnosis not present

## 2016-10-19 DIAGNOSIS — R1311 Dysphagia, oral phase: Secondary | ICD-10-CM | POA: Diagnosis not present

## 2016-10-19 DIAGNOSIS — R279 Unspecified lack of coordination: Secondary | ICD-10-CM | POA: Diagnosis not present

## 2016-10-19 DIAGNOSIS — G20 Parkinson's disease: Secondary | ICD-10-CM | POA: Diagnosis not present

## 2016-10-20 DIAGNOSIS — G20 Parkinson's disease: Secondary | ICD-10-CM | POA: Diagnosis not present

## 2016-10-20 DIAGNOSIS — R279 Unspecified lack of coordination: Secondary | ICD-10-CM | POA: Diagnosis not present

## 2016-10-20 DIAGNOSIS — M6281 Muscle weakness (generalized): Secondary | ICD-10-CM | POA: Diagnosis not present

## 2016-10-20 DIAGNOSIS — R1311 Dysphagia, oral phase: Secondary | ICD-10-CM | POA: Diagnosis not present

## 2016-10-21 DIAGNOSIS — G20 Parkinson's disease: Secondary | ICD-10-CM | POA: Diagnosis not present

## 2016-10-21 DIAGNOSIS — R1311 Dysphagia, oral phase: Secondary | ICD-10-CM | POA: Diagnosis not present

## 2016-10-21 DIAGNOSIS — M6281 Muscle weakness (generalized): Secondary | ICD-10-CM | POA: Diagnosis not present

## 2016-10-21 DIAGNOSIS — R279 Unspecified lack of coordination: Secondary | ICD-10-CM | POA: Diagnosis not present

## 2016-10-22 DIAGNOSIS — R279 Unspecified lack of coordination: Secondary | ICD-10-CM | POA: Diagnosis not present

## 2016-10-22 DIAGNOSIS — M6281 Muscle weakness (generalized): Secondary | ICD-10-CM | POA: Diagnosis not present

## 2016-10-22 DIAGNOSIS — G20 Parkinson's disease: Secondary | ICD-10-CM | POA: Diagnosis not present

## 2016-10-22 DIAGNOSIS — R1311 Dysphagia, oral phase: Secondary | ICD-10-CM | POA: Diagnosis not present

## 2016-10-24 DIAGNOSIS — G20 Parkinson's disease: Secondary | ICD-10-CM | POA: Diagnosis not present

## 2016-10-24 DIAGNOSIS — R279 Unspecified lack of coordination: Secondary | ICD-10-CM | POA: Diagnosis not present

## 2016-10-24 DIAGNOSIS — M6281 Muscle weakness (generalized): Secondary | ICD-10-CM | POA: Diagnosis not present

## 2016-10-24 DIAGNOSIS — R1311 Dysphagia, oral phase: Secondary | ICD-10-CM | POA: Diagnosis not present

## 2016-10-26 DIAGNOSIS — R1311 Dysphagia, oral phase: Secondary | ICD-10-CM | POA: Diagnosis not present

## 2016-10-26 DIAGNOSIS — M6281 Muscle weakness (generalized): Secondary | ICD-10-CM | POA: Diagnosis not present

## 2016-10-26 DIAGNOSIS — G20 Parkinson's disease: Secondary | ICD-10-CM | POA: Diagnosis not present

## 2016-10-26 DIAGNOSIS — R279 Unspecified lack of coordination: Secondary | ICD-10-CM | POA: Diagnosis not present

## 2016-10-27 ENCOUNTER — Encounter: Payer: Self-pay | Admitting: Adult Health

## 2016-10-27 ENCOUNTER — Non-Acute Institutional Stay (SKILLED_NURSING_FACILITY): Payer: Medicare Other | Admitting: Adult Health

## 2016-10-27 DIAGNOSIS — F333 Major depressive disorder, recurrent, severe with psychotic symptoms: Secondary | ICD-10-CM | POA: Diagnosis not present

## 2016-10-27 DIAGNOSIS — I10 Essential (primary) hypertension: Secondary | ICD-10-CM | POA: Diagnosis not present

## 2016-10-27 DIAGNOSIS — K5909 Other constipation: Secondary | ICD-10-CM | POA: Diagnosis not present

## 2016-10-27 DIAGNOSIS — R600 Localized edema: Secondary | ICD-10-CM | POA: Diagnosis not present

## 2016-10-27 DIAGNOSIS — I482 Chronic atrial fibrillation, unspecified: Secondary | ICD-10-CM

## 2016-10-27 DIAGNOSIS — M6281 Muscle weakness (generalized): Secondary | ICD-10-CM | POA: Diagnosis not present

## 2016-10-27 DIAGNOSIS — K2100 Gastro-esophageal reflux disease with esophagitis, without bleeding: Secondary | ICD-10-CM

## 2016-10-27 DIAGNOSIS — R1311 Dysphagia, oral phase: Secondary | ICD-10-CM | POA: Diagnosis not present

## 2016-10-27 DIAGNOSIS — R279 Unspecified lack of coordination: Secondary | ICD-10-CM | POA: Diagnosis not present

## 2016-10-27 DIAGNOSIS — M1712 Unilateral primary osteoarthritis, left knee: Secondary | ICD-10-CM | POA: Diagnosis not present

## 2016-10-27 DIAGNOSIS — G20 Parkinson's disease: Secondary | ICD-10-CM

## 2016-10-27 NOTE — Progress Notes (Signed)
Patient ID: [name] [name], female   DOB: January 02, 1936, 80 y.o.   MRN: [id number]   Location:   [facility] Room Number: 221-B Place of Service:  SNF (31)   CODE STATUS: Full Code  Allergies  Allergen Reactions  . Penicillins     Chief Complaint  Patient presents with  . Medical Management of Chronic Issues    Follow up    HPI:  She is a long term resident of this facility being seen for the management of her chronic illnesses. Overall her status is stable. She tells me that she is feeling and is denying any pain. There are no nursing concerns at this time.    Past Medical History:  Diagnosis Date  . A-fib ([place])   . Anemia   . Anxiety   . Chronic kidney disease   . Chronic pain   . Constipation   . Depression   . Edema   . Gait disturbance   . Hypertension   . Hypokalemia   . Insomnia   . Intertrochanteric fracture ([place])   . Parkinson disease ([place])   . UTI (lower urinary tract infection)   . Vitamin D deficiency     Past Surgical History:  Procedure Laterality Date  . right hip surgery due to fracture      Social History   Social History  . Marital status: Married    Spouse name: N/A  . Number of children: N/A  . Years of education: N/A   Occupational History  . Not on file.   Social History Main Topics  . Smoking status: Never Smoker  . Smokeless tobacco: Never Used  . Alcohol use No  . Drug use: No  . Sexual activity: Not on file   Other Topics Concern  . Not on file   Social History Narrative  . No narrative on file   Family History  Problem Relation Age of Onset  . Cancer Mother   . Cancer Father       VITAL SIGNS BP 111/64   Pulse 100   Temp 97.3 F (36.3 C) (Oral)   Resp 16   Ht 5\' 2"  (1.575 m)   Wt 116 lb (52.6 kg)   SpO2 98%   BMI 21.22 kg/m   Patient's Medications  New Prescriptions   No medications on file  Previous Medications   ACETAMINOPHEN (TYLENOL) 325 MG TABLET    Take 650 mg by mouth 3 (three)  times daily.   ANTISEPTIC ORAL RINSE (BIOTENE) LIQD    15 mLs by Mouth Rinse route 2 (two) times daily.   ASPIRIN 81 MG TABLET    Take 81 mg by mouth daily.   CARBIDOPA-LEVODOPA (SINEMET IR) 25-100 MG PER TABLET    Take 1 tablet by mouth 4 (four) times daily. For muscle spasms   CHOLECALCIFEROL (VITAMIN D) 1000 UNITS TABLET    Take 1,000 Units by mouth daily.   DEXTROMETHORPHAN 15 MG/5ML SYRUP    Take 10 mLs by mouth every 4 (four) hours as needed for cough. Reported on 05/06/2016   DICLOFENAC SODIUM (VOLTAREN) 1 % GEL    Apply 4 g topically 4 (four) times daily. To knees   DILTIAZEM (CARDIZEM) 60 MG TABLET    Take 60 mg by mouth daily.   DOCUSATE SODIUM (COLACE) 100 MG CAPSULE    Take 100 mg by mouth 2 (two) times daily.   FLUOXETINE (PROZAC) 20 MG CAPSULE    Take 60 mg by mouth daily.  Take 3 tablets to equal 60 mg for anxiety.   LAMOTRIGINE (LAMICTAL) 150 MG TABLET    Take 150 mg by mouth daily.   MAGNESIUM HYDROXIDE (MILK OF MAGNESIA) 400 MG/5ML SUSPENSION    Take 30 mLs by mouth daily as needed.    MELATONIN 3 MG CAPS    Take 6 mg by mouth at bedtime.   METOPROLOL TARTRATE (LOPRESSOR) 25 MG TABLET    Take 12.5 mg by mouth daily.    MULTIPLE VITAMIN (DAILY VITE) TABS    Take by mouth daily. Take 1 tablet   OMEPRAZOLE (PRILOSEC) 20 MG CAPSULE    Take 20 mg by mouth daily.   PROMETHAZINE (PHENERGAN) 25 MG TABLET    Take 25 mg by mouth every 6 (six) hours as needed for nausea or vomiting.   ROPINIROLE (REQUIP) 5 MG TABLET    Take 5 mg by mouth at bedtime. For anxiety   SENNOSIDES-DOCUSATE SODIUM (SENOKOT-S) 8.6-50 MG TABLET    Take 2 tablets by mouth daily.   TORSEMIDE (DEMADEX) 10 MG TABLET    Take 10 mg by mouth daily.   UNABLE TO FIND    Med Name: Med Pass 120 cc two times daily  Modified Medications   No medications on file  Discontinued Medications   QUETIAPINE (SEROQUEL) 100 MG TABLET    Take 100 mg by mouth at bedtime. For major depressive disorder     SIGNIFICANT DIAGNOSTIC  EXAMS  03-26-15: left knee x-ray: modest osteoarthritis  03-26-15: left hip with pelvis x-ray; modest osteoarthritis left hip     LABS REVIEWED:   12-18-15: urine culture: e-coli: cipro  06-03-16: wbc 9.4; hgb 10.3; hct 34.3; mcv 87.3; plt 425; glucose 94; bun 16; creat 0.77; k+ 4.2; na++ 137 07-15-16: wbc 8.8 ;hgb 10.4; hct 32; plt 470; glucose 92; bun 12; crea t0.7; k+ 4.6; na++ 137      Review of Systems  Unable to perform ROS: other     Physical Exam Constitutional: No distress.  Thin   Neck: Neck supple. No JVD present. No thyromegaly present.  Breast exam negative  Cardiovascular: Normal rate and intact distal pulses.   Heart rate irregular   Respiratory: lungs clear; no respiratory distress  GI: Soft. She exhibits no distension.  Musculoskeletal: She exhibits no edema.  Is able to move all extremities Has bilateral resting tremors present   Neurological: She is alert.  Skin: Skin is warm and dry. She is not diaphoretic.      ASSESSMENT/ PLAN:  1. Parkinson disease: no change in status will continue sinemet 25/100 mg four times daily and requip 5 mg daily and will monitor   2. Afib: heart rate is stable will continue lopressor 12.5 mg daily and cardizem 60 mg daily for rate control and asa 81 mg daily   3. Hypertension: is stable will continue lopressor 12.5 mg daily and cardizem 60 mg daily   4. Constipation: will continue senna s 2 tabs daily  and colace twice daily   5. Bilateral lower extremity edema: is stable will continue demadex 10 mg daily   6. Major depression: she is followed by [facility]; will continue prozac 60 mg daily; lamictal 150 mg daily to help stabilize mood;  And melatonin 6 mg nightly   7. Insomnia: is without change : will continue melatonin 6 mg nightly; the remeron and belsomra have been stopped.   8. Left knee osteoarthritis: will continue  tylenol 650 mg three times daily  voltaren gel 4  gm four times daily for her knees will monitor        Ok Edwards NP [facility]  Contact [phone] Monday through Friday 8am- 5pm  After hours call [phone]

## 2016-10-28 DIAGNOSIS — G20 Parkinson's disease: Secondary | ICD-10-CM | POA: Diagnosis not present

## 2016-10-28 DIAGNOSIS — M6281 Muscle weakness (generalized): Secondary | ICD-10-CM | POA: Diagnosis not present

## 2016-10-28 DIAGNOSIS — R1311 Dysphagia, oral phase: Secondary | ICD-10-CM | POA: Diagnosis not present

## 2016-10-28 DIAGNOSIS — R279 Unspecified lack of coordination: Secondary | ICD-10-CM | POA: Diagnosis not present

## 2016-10-29 DIAGNOSIS — G20 Parkinson's disease: Secondary | ICD-10-CM | POA: Diagnosis not present

## 2016-10-29 DIAGNOSIS — M6281 Muscle weakness (generalized): Secondary | ICD-10-CM | POA: Diagnosis not present

## 2016-10-29 DIAGNOSIS — R279 Unspecified lack of coordination: Secondary | ICD-10-CM | POA: Diagnosis not present

## 2016-10-29 DIAGNOSIS — R1311 Dysphagia, oral phase: Secondary | ICD-10-CM | POA: Diagnosis not present

## 2016-11-01 DIAGNOSIS — R279 Unspecified lack of coordination: Secondary | ICD-10-CM | POA: Diagnosis not present

## 2016-11-01 DIAGNOSIS — M6281 Muscle weakness (generalized): Secondary | ICD-10-CM | POA: Diagnosis not present

## 2016-11-01 DIAGNOSIS — R1311 Dysphagia, oral phase: Secondary | ICD-10-CM | POA: Diagnosis not present

## 2016-11-01 DIAGNOSIS — G20 Parkinson's disease: Secondary | ICD-10-CM | POA: Diagnosis not present

## 2016-11-02 ENCOUNTER — Non-Acute Institutional Stay (SKILLED_NURSING_FACILITY): Payer: Medicare Other | Admitting: Adult Health

## 2016-11-02 ENCOUNTER — Encounter: Payer: Self-pay | Admitting: Adult Health

## 2016-11-02 DIAGNOSIS — K5909 Other constipation: Secondary | ICD-10-CM

## 2016-11-02 DIAGNOSIS — R112 Nausea with vomiting, unspecified: Secondary | ICD-10-CM

## 2016-11-02 NOTE — Progress Notes (Signed)
Patient ID: [name] [name], female   DOB: 26-May-1936, 80 y.o.   MRN: [id number]   Location:   [place] Room Number: [id number] of Service:  SNF (31)   CODE STATUS: Full Code  Allergies  Allergen Reactions  . Penicillins     Chief Complaint  Patient presents with  . Acute Visit    Nausea and vomiting    HPI:  Staff reports that she is having nausea and vomiting. She does have a large amount of stool present in her rectum. She states that she has a poor appetite. There are no reports of fevers present.   Past Medical History:  Diagnosis Date  . A-fib ([place])   . Anemia   . Anxiety   . Chronic kidney disease   . Chronic pain   . Constipation   . Depression   . Edema   . Gait disturbance   . Hypertension   . Hypokalemia   . Insomnia   . Intertrochanteric fracture ([place])   . Parkinson disease ([place])   . UTI (lower urinary tract infection)   . Vitamin D deficiency     Past Surgical History:  Procedure Laterality Date  . right hip surgery due to fracture      Social History   Social History  . Marital status: Married    Spouse name: N/A  . Number of children: N/A  . Years of education: N/A   Occupational History  . Not on file.   Social History Main Topics  . Smoking status: Never Smoker  . Smokeless tobacco: Never Used  . Alcohol use No  . Drug use: No  . Sexual activity: Not on file   Other Topics Concern  . Not on file   Social History Narrative  . No narrative on file   Family History  Problem Relation Age of Onset  . Cancer Mother   . Cancer Father       VITAL SIGNS BP 112/72   Pulse (!) 125   Temp 97 F (36.1 C) (Oral)   Resp (!) 22   Ht 5\' 2"  (1.575 m)   Wt 116 lb (52.6 kg)   SpO2 97%   BMI 21.22 kg/m   Patient's Medications  New Prescriptions   No medications on file  Previous Medications   ACETAMINOPHEN (TYLENOL) 325 MG TABLET    Take 650 mg by mouth 3 (three) times daily.   ANTISEPTIC ORAL RINSE (BIOTENE) LIQD     15 mLs by Mouth Rinse route 2 (two) times daily.   ASPIRIN 81 MG TABLET    Take 81 mg by mouth daily.   CARBIDOPA-LEVODOPA (SINEMET IR) 25-100 MG PER TABLET    Take 1 tablet by mouth 4 (four) times daily. For muscle spasms   CHOLECALCIFEROL (VITAMIN D) 1000 UNITS TABLET    Take 1,000 Units by mouth daily.   DEXTROMETHORPHAN 15 MG/5ML SYRUP    Take 10 mLs by mouth every 4 (four) hours as needed for cough. Reported on 05/06/2016   DICLOFENAC SODIUM (VOLTAREN) 1 % GEL    Apply 4 g topically 4 (four) times daily. To knees   DILTIAZEM (CARDIZEM) 60 MG TABLET    Take 60 mg by mouth daily.   DOCUSATE SODIUM (COLACE) 100 MG CAPSULE    Take 100 mg by mouth 2 (two) times daily.   FLUOXETINE (PROZAC) 20 MG CAPSULE    Take 60 mg by mouth daily. Take 3 tablets to equal 60 mg for  anxiety.   LAMOTRIGINE (LAMICTAL) 150 MG TABLET    Take 150 mg by mouth daily.   MAGNESIUM HYDROXIDE (MILK OF MAGNESIA) 400 MG/5ML SUSPENSION    Take 30 mLs by mouth daily as needed.    MELATONIN 3 MG CAPS    Take 6 mg by mouth at bedtime.   METOPROLOL TARTRATE (LOPRESSOR) 25 MG TABLET    Take 12.5 mg by mouth daily.    MULTIPLE VITAMIN (DAILY VITE) TABS    Take by mouth daily. Take 1 tablet   OMEPRAZOLE (PRILOSEC) 20 MG CAPSULE    Take 20 mg by mouth daily.   PROMETHAZINE (PHENERGAN) 25 MG TABLET    Take 25 mg by mouth every 6 (six) hours as needed for nausea or vomiting.   QUETIAPINE (SEROQUEL) 50 MG TABLET    Take 75 mg by mouth at bedtime.   ROPINIROLE (REQUIP) 5 MG TABLET    Take 5 mg by mouth at bedtime. For anxiety   SENNOSIDES-DOCUSATE SODIUM (SENOKOT-S) 8.6-50 MG TABLET    Take 2 tablets by mouth daily.   TORSEMIDE (DEMADEX) 10 MG TABLET    Take 10 mg by mouth daily.   UNABLE TO FIND    Med Name: Med Pass 120 cc two times daily  Modified Medications   No medications on file  Discontinued Medications   No medications on file     SIGNIFICANT DIAGNOSTIC EXAMS  03-26-15: left knee x-ray: modest  osteoarthritis  03-26-15: left hip with pelvis x-ray; modest osteoarthritis left hip     LABS REVIEWED:   12-18-15: urine culture: e-coli: cipro    Review of Systems  Unable to perform ROS: other       Physical Exam Constitutional: No distress.  Thin   Neck: Neck supple. No JVD present. No thyromegaly present.  Breast exam negative  Cardiovascular: Normal rate and intact distal pulses.   Heart rate irregular   Respiratory: lungs clear; no respiratory distress  GI: Soft. She exhibits no distension. has large amount hard stool present in rectum Musculoskeletal: She exhibits no edema.  Is able to move all extremities Has bilateral resting tremors present   Neurological: She is alert.  Skin: Skin is warm and dry. She is not diaphoretic.      ASSESSMENT/ PLAN:  1.nausea and vomiting 2. Constipation Will begin senna s 2 tabs twice daily Dulcolax supp given   Ok Edwards NP [facility]  Contact [phone] Monday through Friday 8am- 5pm  After hours call [phone]

## 2016-11-23 DIAGNOSIS — R112 Nausea with vomiting, unspecified: Secondary | ICD-10-CM | POA: Insufficient documentation

## 2016-11-26 ENCOUNTER — Non-Acute Institutional Stay (SKILLED_NURSING_FACILITY): Payer: Medicare Other | Admitting: Adult Health

## 2016-11-26 ENCOUNTER — Encounter: Payer: Self-pay | Admitting: Adult Health

## 2016-11-26 DIAGNOSIS — K2100 Gastro-esophageal reflux disease with esophagitis, without bleeding: Secondary | ICD-10-CM

## 2016-11-26 DIAGNOSIS — M1712 Unilateral primary osteoarthritis, left knee: Secondary | ICD-10-CM

## 2016-11-26 DIAGNOSIS — I482 Chronic atrial fibrillation, unspecified: Secondary | ICD-10-CM

## 2016-11-26 DIAGNOSIS — R600 Localized edema: Secondary | ICD-10-CM | POA: Diagnosis not present

## 2016-11-26 DIAGNOSIS — F333 Major depressive disorder, recurrent, severe with psychotic symptoms: Secondary | ICD-10-CM

## 2016-11-26 DIAGNOSIS — I10 Essential (primary) hypertension: Secondary | ICD-10-CM

## 2016-11-26 DIAGNOSIS — G20 Parkinson's disease: Secondary | ICD-10-CM | POA: Diagnosis not present

## 2016-11-26 NOTE — Progress Notes (Signed)
Patient ID: [name] [name], female   DOB: 11/13/1936, 80 y.o.   MRN: [id number]   Location:   [place] Room Number: 221-B Place of Service:  SNF (31)   CODE STATUS: Full Code  Allergies  Allergen Reactions  . Penicillins     Chief Complaint  Patient presents with  . Medical Management of Chronic Issues    Follow up    HPI:  She is a long term resident of this facility being seen for the management of her chronic illnesses. She has been declining nearly all of her medications. She tells me that she feels as though her medications are not effective. She tells me that she is feeling sad. She does tell met that she will take medications that will help her feel better. We discussed her medication regimen and will hold some medications to see if this will help with her medication regimen. She did state she would try this plan.    Past Medical History:  Diagnosis Date  . A-fib ([place])   . Anemia   . Anxiety   . Chronic kidney disease   . Chronic pain   . Constipation   . Depression   . Edema   . Gait disturbance   . Hypertension   . Hypokalemia   . Insomnia   . Intertrochanteric fracture ([place])   . Parkinson disease ([place])   . UTI (lower urinary tract infection)   . Vitamin D deficiency     Past Surgical History:  Procedure Laterality Date  . right hip surgery due to fracture      Social History   Social History  . Marital status: Married    Spouse name: N/A  . Number of children: N/A  . Years of education: N/A   Occupational History  . Not on file.   Social History Main Topics  . Smoking status: Never Smoker  . Smokeless tobacco: Never Used  . Alcohol use No  . Drug use: No  . Sexual activity: Not on file   Other Topics Concern  . Not on file   Social History Narrative  . No narrative on file   Family History  Problem Relation Age of Onset  . Cancer Mother   . Cancer Father       VITAL SIGNS BP 114/62   Pulse 82   Temp 97.1 F  (36.2 C) (Oral)   Resp 20   Ht 5' 2"  (1.575 m)   Wt 115 lb (52.2 kg)   SpO2 96%   BMI 21.03 kg/m   Patient's Medications  New Prescriptions   No medications on file  Previous Medications   ACETAMINOPHEN (TYLENOL) 325 MG TABLET    Take 650 mg by mouth 3 (three) times daily.   ANTISEPTIC ORAL RINSE (BIOTENE) LIQD    15 mLs by Mouth Rinse route 2 (two) times daily.   ASPIRIN 81 MG TABLET    Take 81 mg by mouth daily.   CARBIDOPA-LEVODOPA (SINEMET IR) 25-100 MG PER TABLET    Take 1 tablet by mouth 4 (four) times daily. For muscle spasms   CHOLECALCIFEROL (VITAMIN D) 1000 UNITS TABLET    Take 1,000 Units by mouth daily.   DEXTROMETHORPHAN 15 MG/5ML SYRUP    Take 10 mLs by mouth every 4 (four) hours as needed for cough. Reported on 05/06/2016   DICLOFENAC SODIUM (VOLTAREN) 1 % GEL    Apply 4 g topically 4 (four) times daily. To knees   DILTIAZEM (CARDIZEM)  60 MG TABLET    Take 60 mg by mouth daily.   FLUOXETINE (PROZAC) 20 MG CAPSULE    Take 60 mg by mouth daily. Take 3 tablets to equal 60 mg for anxiety.   LAMOTRIGINE (LAMICTAL) 150 MG TABLET    Take 150 mg by mouth daily.   MAGNESIUM HYDROXIDE (MILK OF MAGNESIA) 400 MG/5ML SUSPENSION    Take 30 mLs by mouth daily as needed.    MELATONIN 3 MG CAPS    Take 6 mg by mouth at bedtime.   METOPROLOL TARTRATE (LOPRESSOR) 25 MG TABLET    Take 12.5 mg by mouth daily.    MULTIPLE VITAMIN (DAILY VITE) TABS    Take by mouth daily. Take 1 tablet   OMEPRAZOLE (PRILOSEC) 20 MG CAPSULE    Take 20 mg by mouth daily.   PROMETHAZINE (PHENERGAN) 25 MG TABLET    Take 25 mg by mouth every 6 (six) hours as needed for nausea or vomiting.   QUETIAPINE (SEROQUEL) 50 MG TABLET    Take 75 mg by mouth at bedtime.   ROPINIROLE (REQUIP) 5 MG TABLET    Take 5 mg by mouth at bedtime. For anxiety   SENNOSIDES-DOCUSATE SODIUM (SENOKOT-S) 8.6-50 MG TABLET    Take 2 tablets by mouth daily.   TORSEMIDE (DEMADEX) 10 MG TABLET    Take 10 mg by mouth daily.   UNABLE TO FIND     Med Name: Med Pass 120 cc two times daily  Modified Medications   No medications on file  Discontinued Medications   DOCUSATE SODIUM (COLACE) 100 MG CAPSULE    Take 100 mg by mouth 2 (two) times daily.     SIGNIFICANT DIAGNOSTIC EXAMS  03-26-15: left knee x-ray: modest osteoarthritis  03-26-15: left hip with pelvis x-ray; modest osteoarthritis left hip   LABS REVIEWED:   12-18-15: urine culture: e-coli: cipro  06-03-16: wbc 9.4; hgb 10.3; hct 34.3; mcv 87.3; plt 425; glucose 94; bun 16; creat 0.77; k+ 4.2; na++ 137 07-15-16: wbc 8.8 ;hgb 10.4; hct 32; plt 470; glucose 92; bun 12; crea t0.7; k+ 4.6; na++ 137  08-31-16: wbc 8.8; hgb 10.7; hct 36.7; mcv 83.6; plt 466; glucose 130; bun 12.1; creat 0.70; k+ 4.5; na++ 139     Review of Systems  Unable to perform ROS: other     Physical Exam Constitutional: No distress.  Thin   Neck: Neck supple. No JVD present. No thyromegaly present.  Breast exam negative  Cardiovascular: Normal rate and intact distal pulses.   Heart rate irregular   Respiratory: lungs clear; no respiratory distress  GI: Soft. She exhibits no distension.bowel sounds present  Musculoskeletal: She exhibits no edema.  Is able to move all extremities Has bilateral resting tremors present   Neurological: She is alert.  Skin: Skin is warm and dry. She is not diaphoretic.    ASSESSMENT/ PLAN:   1. Parkinson disease: no change in status will continue sinemet 25/100 mg four times daily and requip 5 mg daily and will monitor   2. Afib: heart rate is stable will hold lopressor asa and cardizem   3. Hypertension: is stable will hold lopressor and cardizem   4. Constipation: will colace twice daily  Will hold senna   5. Bilateral lower extremity edema: is stable will hold demadex   6. Major depression: she is followed by [facility]; will continue prozac 60 mg daily; lamictal 150 mg daily to help stabilize mood;  And melatonin 6 mg nightly  7. Insomnia: is without change :  will continue melatonin 6 mg nightly; the remeron and belsomra have been stopped.   8. Left knee osteoarthritis: will hold voltaren gel and tylenol  Will hold these medications for one week and will monitor   MD is aware of resident's narcotic use and is in agreement with current plan of care. We will attempt to wean resident as apropriate   Ok Edwards NP [facility]  Contact [phone] Monday through Friday 8am- 5pm  After hours call [phone]

## 2016-12-29 ENCOUNTER — Non-Acute Institutional Stay (SKILLED_NURSING_FACILITY): Payer: Medicare Other | Admitting: Adult Health

## 2016-12-29 ENCOUNTER — Encounter: Payer: Self-pay | Admitting: Adult Health

## 2016-12-29 DIAGNOSIS — I482 Chronic atrial fibrillation, unspecified: Secondary | ICD-10-CM

## 2016-12-29 DIAGNOSIS — M1712 Unilateral primary osteoarthritis, left knee: Secondary | ICD-10-CM

## 2016-12-29 DIAGNOSIS — R600 Localized edema: Secondary | ICD-10-CM | POA: Diagnosis not present

## 2016-12-29 DIAGNOSIS — F333 Major depressive disorder, recurrent, severe with psychotic symptoms: Secondary | ICD-10-CM

## 2016-12-29 DIAGNOSIS — I10 Essential (primary) hypertension: Secondary | ICD-10-CM

## 2016-12-29 DIAGNOSIS — K5909 Other constipation: Secondary | ICD-10-CM

## 2016-12-29 DIAGNOSIS — G20 Parkinson's disease: Secondary | ICD-10-CM | POA: Diagnosis not present

## 2016-12-29 NOTE — Progress Notes (Signed)
Location:   [place]   Place of Service:  SNF (31)   CODE STATUS: full code   Allergies  Allergen Reactions  . Penicillins     Chief Complaint  Patient presents with  . Medical Management of Chronic Issues    HPI:  She is a long term resident of this facility being seen for the management of her chronic illnesses. She tells met that she is feeling a little bit better; and is now taking her medications. She tells me that her depression is worse and she is having difficulty sleeping at night. She would like her seroquel increase to see if this medication will allow her to sleep at night.   Past Medical History:  Diagnosis Date  . A-fib ([place])   . Anemia   . Anxiety   . Chronic kidney disease   . Chronic pain   . Constipation   . Depression   . Edema   . Gait disturbance   . Hypertension   . Hypokalemia   . Insomnia   . Intertrochanteric fracture ([place])   . Parkinson disease ([place])   . UTI (lower urinary tract infection)   . Vitamin D deficiency     Past Surgical History:  Procedure Laterality Date  . right hip surgery due to fracture      Social History   Social History  . Marital status: Married    Spouse name: N/A  . Number of children: N/A  . Years of education: N/A   Occupational History  . Not on file.   Social History Main Topics  . Smoking status: Never Smoker  . Smokeless tobacco: Never Used  . Alcohol use No  . Drug use: No  . Sexual activity: Not on file   Other Topics Concern  . Not on file   Social History Narrative  . No narrative on file   Family History  Problem Relation Age of Onset  . Cancer Mother   . Cancer Father       VITAL SIGNS BP (!) 118/56   Pulse 60   Temp 98.2 F (36.8 C)   Resp 16   Ht 5' 2"  (1.575 m)   Wt 114 lb 9.6 oz (52 kg)   SpO2 97%   BMI 20.96 kg/m   Patient's Medications  New Prescriptions   No medications on file  Previous Medications   ACETAMINOPHEN (TYLENOL) 325 MG TABLET    Take 650  mg by mouth 3 (three) times daily.   ANTISEPTIC ORAL RINSE (BIOTENE) LIQD    15 mLs by Mouth Rinse route 2 (two) times daily.   ASPIRIN 81 MG TABLET    Take 81 mg by mouth daily.   CARBIDOPA-LEVODOPA (SINEMET IR) 25-100 MG PER TABLET    Take 1 tablet by mouth 4 (four) times daily. For muscle spasms   CHOLECALCIFEROL (VITAMIN D) 1000 UNITS TABLET    Take 1,000 Units by mouth daily.   DEXTROMETHORPHAN 15 MG/5ML SYRUP    Take 10 mLs by mouth every 4 (four) hours as needed for cough. Reported on 05/06/2016   DICLOFENAC SODIUM (VOLTAREN) 1 % GEL    Apply 4 g topically 4 (four) times daily. To knees   DILTIAZEM (CARDIZEM) 60 MG TABLET    Take 60 mg by mouth daily.   FLUOXETINE (PROZAC) 20 MG CAPSULE    Take 60 mg by mouth daily. Take 3 tablets to equal 60 mg for anxiety.   LAMOTRIGINE (LAMICTAL) 150 MG TABLET  Take 150 mg by mouth daily.   MAGNESIUM HYDROXIDE (MILK OF MAGNESIA) 400 MG/5ML SUSPENSION    Take 30 mLs by mouth daily as needed.    MELATONIN 3 MG CAPS    Take 6 mg by mouth at bedtime.   METOPROLOL TARTRATE (LOPRESSOR) 25 MG TABLET    Take 12.5 mg by mouth daily.    MULTIPLE VITAMIN (DAILY VITE) TABS    Take by mouth daily. Take 1 tablet   OMEPRAZOLE (PRILOSEC) 20 MG CAPSULE    Take 20 mg by mouth daily.   PROMETHAZINE (PHENERGAN) 25 MG TABLET    Take 25 mg by mouth every 6 (six) hours as needed for nausea or vomiting.   QUETIAPINE (SEROQUEL) 50 MG TABLET    Take 75 mg by mouth at bedtime.   ROPINIROLE (REQUIP) 5 MG TABLET    Take 5 mg by mouth at bedtime. For anxiety   SENNA (SENOKOT) 8.6 MG TABS TABLET    Take 2 tablets by mouth at bedtime.   TORSEMIDE (DEMADEX) 10 MG TABLET    Take 10 mg by mouth daily.   UNABLE TO FIND    Med Name: Med Pass 120 cc two times daily  Modified Medications   No medications on file  Discontinued Medications   SENNOSIDES-DOCUSATE SODIUM (SENOKOT-S) 8.6-50 MG TABLET    Take 2 tablets by mouth daily.     SIGNIFICANT DIAGNOSTIC EXAMS    03-26-15: left  knee x-ray: modest osteoarthritis  03-26-15: left hip with pelvis x-ray; modest osteoarthritis left hip   LABS REVIEWED:   12-18-15: urine culture: e-coli: cipro  06-03-16: wbc 9.4; hgb 10.3; hct 34.3; mcv 87.3; plt 425; glucose 94; bun 16; creat 0.77; k+ 4.2; na++ 137 07-15-16: wbc 8.8 ;hgb 10.4; hct 32; plt 470; glucose 92; bun 12; crea t0.7; k+ 4.6; na++ 137  08-31-16: wbc 8.8; hgb 10.7; hct 36.7; mcv 83.6; plt 466; glucose 130; bun 12.1; creat 0.70; k+ 4.5; na++ 139     Review of Systems  Unable to perform ROS: other     Physical Exam Constitutional: No distress.  Thin   Neck: Neck supple. No JVD present. No thyromegaly present.  Breast exam negative  Cardiovascular: Normal rate and intact distal pulses.   Heart rate irregular   Respiratory: lungs clear; no respiratory distress  GI: Soft. She exhibits no distension.bowel sounds present  Musculoskeletal: She exhibits no edema.  Is able to move all extremities Has bilateral resting tremors present   Neurological: She is alert.  Skin: Skin is warm and dry. She is not diaphoretic.    ASSESSMENT/ PLAN:   1. Parkinson disease: no change in status will continue sinemet 25/100 mg four times daily and requip 5 mg daily and will monitor   2. Afib: heart rate is stable will continue asa 81 mg daily lopressor 12.5 mg daily and cardizem 60 mg daily  3. Hypertension: will continue lopressor 12.5 mg daily and cardizem 60 mg daily   4. Constipation: will continue senna 2 tabs daily   5. Bilateral lower extremity edema: is stable will continue demedex 10 mg daily   6. Major depression: she is followed by [facility]; will continue prozac 60 mg daily; lamictal 150 mg daily to help stabilize mood;  melatonin 6 mg nightly  Will increase her seroquel to 100 mg daily   7. Insomnia: is without change : will continue melatonin 6 mg nightly; .   8. Left knee osteoarthritis: will continue tylenol 650 mg three times  daily and voltaren gel 4 gm to knees  four times daily     MD is aware of resident's narcotic use and is in agreement with current plan of care. We will attempt to wean resident as apropriate   Ok Edwards NP [facility]  Contact [phone] Monday through Friday 8am- 5pm  After hours call [phone]

## 2016-12-30 DIAGNOSIS — R6889 Other general symptoms and signs: Secondary | ICD-10-CM | POA: Diagnosis not present

## 2016-12-30 DIAGNOSIS — E1165 Type 2 diabetes mellitus with hyperglycemia: Secondary | ICD-10-CM | POA: Diagnosis not present

## 2016-12-30 DIAGNOSIS — I120 Hypertensive chronic kidney disease with stage 5 chronic kidney disease or end stage renal disease: Secondary | ICD-10-CM | POA: Diagnosis not present

## 2017-02-02 ENCOUNTER — Encounter: Payer: Self-pay | Admitting: Adult Health

## 2017-02-02 ENCOUNTER — Non-Acute Institutional Stay (SKILLED_NURSING_FACILITY): Payer: Medicare Other | Admitting: Adult Health

## 2017-02-02 DIAGNOSIS — G20 Parkinson's disease: Secondary | ICD-10-CM

## 2017-02-02 DIAGNOSIS — F333 Major depressive disorder, recurrent, severe with psychotic symptoms: Secondary | ICD-10-CM

## 2017-02-02 DIAGNOSIS — R600 Localized edema: Secondary | ICD-10-CM | POA: Diagnosis not present

## 2017-02-02 DIAGNOSIS — I482 Chronic atrial fibrillation, unspecified: Secondary | ICD-10-CM

## 2017-02-02 DIAGNOSIS — I10 Essential (primary) hypertension: Secondary | ICD-10-CM

## 2017-02-02 DIAGNOSIS — M1712 Unilateral primary osteoarthritis, left knee: Secondary | ICD-10-CM | POA: Diagnosis not present

## 2017-02-02 NOTE — Progress Notes (Signed)
Location:  [place] Room Number: [place] of Service:  SNF (31)   CODE STATUS: Full Code  Allergies  Allergen Reactions  . Penicillins     Chief Complaint  Patient presents with  . Medical Management of Chronic Issues    Routine Visit    HPI:  She is a long term resident of this facility being seen for the management of her chronic illnesses. Overall her status is stable. She does spend most of her time in bed per her choice. She tells me that she is feeling good and has no pain present. There are no nursing concerns at this time.    Past Medical History:  Diagnosis Date  . A-fib ([place])   . Anemia   . Anxiety   . Chronic kidney disease   . Chronic pain   . Constipation   . Depression   . Edema   . Gait disturbance   . Hypertension   . Hypokalemia   . Insomnia   . Intertrochanteric fracture ([place])   . Parkinson disease ([place])   . UTI (lower urinary tract infection)   . Vitamin D deficiency     Past Surgical History:  Procedure Laterality Date  . right hip surgery due to fracture      Social History   Social History  . Marital status: Married    Spouse name: N/A  . Number of children: N/A  . Years of education: N/A   Occupational History  . Not on file.   Social History Main Topics  . Smoking status: Never Smoker  . Smokeless tobacco: Never Used  . Alcohol use No  . Drug use: No  . Sexual activity: Not Currently   Other Topics Concern  . Not on file   Social History Narrative  . No narrative on file   Family History  Problem Relation Age of Onset  . Cancer Mother   . Cancer Father       VITAL SIGNS BP (!) 142/66   Pulse 67   Temp 97 F (36.1 C)   Resp 18   Ht 5\' 2"  (1.575 m)   Wt 113 lb 9.6 oz (51.5 kg)   SpO2 98%   BMI 20.78 kg/m   Patient's Medications  New Prescriptions   No medications on file  Previous Medications   ACETAMINOPHEN (TYLENOL) 325 MG TABLET    Take 650 mg by mouth 3 (three) times daily.   ANTISEPTIC ORAL RINSE (BIOTENE) LIQD    15 mLs by Mouth Rinse route 2 (two) times daily.   ASPIRIN 81 MG TABLET    Take 81 mg by mouth daily.   CARBIDOPA-LEVODOPA (SINEMET IR) 25-100 MG PER TABLET    Take 1 tablet by mouth 4 (four) times daily. For muscle spasms   CHOLECALCIFEROL (VITAMIN D) 1000 UNITS TABLET    Take 1,000 Units by mouth daily.   DICLOFENAC SODIUM (VOLTAREN) 1 % GEL    Apply 4 g topically 4 (four) times daily. To knees   DILTIAZEM (CARDIZEM) 60 MG TABLET    Take 60 mg by mouth daily.   FLUOXETINE (PROZAC) 20 MG CAPSULE    Take 60 mg by mouth daily. Take 3 tablets to equal 60 mg for anxiety.   LAMOTRIGINE (LAMICTAL) 150 MG TABLET    Take 150 mg by mouth daily.   MAGNESIUM HYDROXIDE (MILK OF MAGNESIA) 400 MG/5ML SUSPENSION    Take 30 mLs by mouth daily as needed.    MELATONIN  3 MG CAPS    Take 6 mg by mouth at bedtime.   METOPROLOL TARTRATE (LOPRESSOR) 25 MG TABLET    Take 12.5 mg by mouth daily.    MULTIPLE VITAMIN (DAILY VITE) TABS    Take by mouth daily. Take 1 tablet   OMEPRAZOLE (PRILOSEC) 20 MG CAPSULE    Take 20 mg by mouth daily.   PROMETHAZINE (PHENERGAN) 25 MG TABLET    Take 25 mg by mouth every 6 (six) hours as needed for nausea or vomiting.   QUETIAPINE (SEROQUEL) 100 MG TABLET    Take 100 mg by mouth at bedtime.   ROPINIROLE (REQUIP) 5 MG TABLET    Take 5 mg by mouth at bedtime. For anxiety   SENNA (SENOKOT) 8.6 MG TABS TABLET    Take 2 tablets by mouth at bedtime.   TORSEMIDE (DEMADEX) 10 MG TABLET    Take 10 mg by mouth daily.   UNABLE TO FIND    Med Name: Med Pass 120 cc two times daily  Modified Medications   No medications on file  Discontinued Medications   DEXTROMETHORPHAN 15 MG/5ML SYRUP    Take 10 mLs by mouth every 4 (four) hours as needed for cough. Reported on 05/06/2016   QUETIAPINE (SEROQUEL) 50 MG TABLET    Take 75 mg by mouth at bedtime.     SIGNIFICANT DIAGNOSTIC EXAMS  03-26-15: left knee x-ray: modest osteoarthritis  03-26-15: left hip with  pelvis x-ray; modest osteoarthritis left hip   LABS REVIEWED:   06-03-16: wbc 9.4; hgb 10.3; hct 34.3; mcv 87.3; plt 425; glucose 94; bun 16; creat 0.77; k+ 4.2; na++ 137 07-15-16: wbc 8.8 ;hgb 10.4; hct 32; plt 470; glucose 92; bun 12; crea t0.7; k+ 4.6; na++ 137  08-31-16: wbc 8.8; hgb 10.7; hct 36.7; mcv 83.6; plt 466; glucose 130; bun 12.1; creat 0.70; k+ 4.5; na++ 139     Review of Systems  Unable to perform ROS: other     Physical Exam Constitutional: No distress.  Thin   Neck: Neck supple. No JVD present. No thyromegaly present.  Breast exam negative  Cardiovascular: Normal rate and intact distal pulses.   Heart rate irregular   Respiratory: lungs clear; no respiratory distress  GI: Soft. She exhibits no distension.bowel sounds present  Musculoskeletal: She exhibits no edema.  Is able to move all extremities Has bilateral resting tremors present   Neurological: She is alert.  Skin: Skin is warm and dry. She is not diaphoretic.    ASSESSMENT/ PLAN:  1. Parkinson disease: no change in status will continue sinemet 25/100 mg four times daily and requip 5 mg daily and will monitor   2. Afib: heart rate is stable will continue asa 81 mg daily lopressor 12.5 mg daily and cardizem 60 mg daily   3. Hypertension: will continue lopressor 12.5 mg daily and cardizem 60 mg daily   4. Constipation: will continue senna 2 tabs daily   5. Bilateral lower extremity edema: is stable will continue demedex 10 mg daily   6. Major depression: she is followed by [facility]; will continue prozac 60 mg daily; lamictal 150 mg daily to help stabilize mood;  melatonin 6 mg nightly  Will continue seroquel 100 mg daily   7. Insomnia: is without change : will continue melatonin 6 mg nightly; .   8. Left knee osteoarthritis: will continue tylenol 650 mg three times daily and voltaren gel 4 gm to knees four times daily    Will check  cbc cmp      Ok Edwards NP [facility]  Contact  [phone] Monday through Friday 8am- 5pm  After hours call [phone]

## 2017-02-03 DIAGNOSIS — Z79899 Other long term (current) drug therapy: Secondary | ICD-10-CM | POA: Diagnosis not present

## 2017-02-03 LAB — BASIC METABOLIC PANEL
BUN: 13 mg/dL (ref 4–21)
CREATININE: 0.6 mg/dL (ref 0.5–1.1)
Glucose: 91 mg/dL
POTASSIUM: 4.2 mmol/L (ref 3.4–5.3)
Sodium: 138 mmol/L (ref 137–147)

## 2017-02-03 LAB — CBC AND DIFFERENTIAL
HCT: 32 % — AB (ref 36–46)
HEMOGLOBIN: 9.9 g/dL — AB (ref 12.0–16.0)
Neutrophils Absolute: 5 /uL
Platelets: 423 10*3/uL — AB (ref 150–399)
WBC: 9.1 10*3/mL

## 2017-02-03 LAB — HEPATIC FUNCTION PANEL
ALT: 5 U/L — AB (ref 7–35)
AST: 9 U/L — AB (ref 13–35)
Alkaline Phosphatase: 135 U/L — AB (ref 25–125)
Bilirubin, Total: 0.2 mg/dL

## 2017-02-08 DIAGNOSIS — M6281 Muscle weakness (generalized): Secondary | ICD-10-CM | POA: Diagnosis not present

## 2017-02-08 DIAGNOSIS — G20 Parkinson's disease: Secondary | ICD-10-CM | POA: Diagnosis not present

## 2017-02-08 DIAGNOSIS — R279 Unspecified lack of coordination: Secondary | ICD-10-CM | POA: Diagnosis not present

## 2017-02-08 DIAGNOSIS — R1311 Dysphagia, oral phase: Secondary | ICD-10-CM | POA: Diagnosis not present

## 2017-02-09 DIAGNOSIS — M6281 Muscle weakness (generalized): Secondary | ICD-10-CM | POA: Diagnosis not present

## 2017-02-09 DIAGNOSIS — R279 Unspecified lack of coordination: Secondary | ICD-10-CM | POA: Diagnosis not present

## 2017-02-09 DIAGNOSIS — R1311 Dysphagia, oral phase: Secondary | ICD-10-CM | POA: Diagnosis not present

## 2017-02-09 DIAGNOSIS — G20 Parkinson's disease: Secondary | ICD-10-CM | POA: Diagnosis not present

## 2017-02-10 ENCOUNTER — Emergency Department (HOSPITAL_COMMUNITY): Payer: Medicare Other

## 2017-02-10 ENCOUNTER — Encounter (HOSPITAL_COMMUNITY): Payer: Self-pay | Admitting: *Deleted

## 2017-02-10 ENCOUNTER — Emergency Department (HOSPITAL_COMMUNITY)
Admission: EM | Admit: 2017-02-10 | Discharge: 2017-02-10 | Disposition: A | Discharge status: Home / Self Care | Payer: Medicare Other | Attending: Emergency Medicine | Admitting: Emergency Medicine

## 2017-02-10 DIAGNOSIS — R251 Tremor, unspecified: Secondary | ICD-10-CM | POA: Diagnosis not present

## 2017-02-10 DIAGNOSIS — G20 Parkinson's disease: Secondary | ICD-10-CM | POA: Diagnosis not present

## 2017-02-10 DIAGNOSIS — I129 Hypertensive chronic kidney disease with stage 1 through stage 4 chronic kidney disease, or unspecified chronic kidney disease: Secondary | ICD-10-CM | POA: Diagnosis not present

## 2017-02-10 DIAGNOSIS — Z7982 Long term (current) use of aspirin: Secondary | ICD-10-CM | POA: Diagnosis not present

## 2017-02-10 DIAGNOSIS — R627 Adult failure to thrive: Secondary | ICD-10-CM | POA: Diagnosis not present

## 2017-02-10 DIAGNOSIS — M6281 Muscle weakness (generalized): Secondary | ICD-10-CM | POA: Diagnosis not present

## 2017-02-10 DIAGNOSIS — R8271 Bacteriuria: Secondary | ICD-10-CM | POA: Diagnosis not present

## 2017-02-10 DIAGNOSIS — Z79899 Other long term (current) drug therapy: Secondary | ICD-10-CM | POA: Insufficient documentation

## 2017-02-10 DIAGNOSIS — N189 Chronic kidney disease, unspecified: Secondary | ICD-10-CM | POA: Diagnosis not present

## 2017-02-10 DIAGNOSIS — R079 Chest pain, unspecified: Secondary | ICD-10-CM | POA: Diagnosis not present

## 2017-02-10 DIAGNOSIS — D649 Anemia, unspecified: Secondary | ICD-10-CM | POA: Diagnosis not present

## 2017-02-10 DIAGNOSIS — R1311 Dysphagia, oral phase: Secondary | ICD-10-CM | POA: Diagnosis not present

## 2017-02-10 DIAGNOSIS — R531 Weakness: Secondary | ICD-10-CM | POA: Diagnosis not present

## 2017-02-10 DIAGNOSIS — R279 Unspecified lack of coordination: Secondary | ICD-10-CM | POA: Diagnosis not present

## 2017-02-10 LAB — URINALYSIS, ROUTINE W REFLEX MICROSCOPIC
BILIRUBIN URINE: NEGATIVE
GLUCOSE, UA: NEGATIVE mg/dL
HGB URINE DIPSTICK: NEGATIVE
Ketones, ur: 5 mg/dL — AB
Nitrite: POSITIVE — AB
PH: 5 (ref 5.0–8.0)
Protein, ur: 100 mg/dL — AB
SPECIFIC GRAVITY, URINE: 1.02 (ref 1.005–1.030)

## 2017-02-10 LAB — CBC WITH DIFFERENTIAL/PLATELET
Basophils Absolute: 0 10*3/uL (ref 0.0–0.1)
Basophils Relative: 0 %
EOS ABS: 0.3 10*3/uL (ref 0.0–0.7)
Eosinophils Relative: 3 %
HEMATOCRIT: 32.4 % — AB (ref 36.0–46.0)
HEMOGLOBIN: 9.7 g/dL — AB (ref 12.0–15.0)
LYMPHS ABS: 2 10*3/uL (ref 0.7–4.0)
Lymphocytes Relative: 22 %
MCH: 25.1 pg — AB (ref 26.0–34.0)
MCHC: 29.9 g/dL — AB (ref 30.0–36.0)
MCV: 83.7 fL (ref 78.0–100.0)
MONOS PCT: 6 %
Monocytes Absolute: 0.5 10*3/uL (ref 0.1–1.0)
Neutro Abs: 6.3 10*3/uL (ref 1.7–7.7)
Neutrophils Relative %: 69 %
Platelets: 413 10*3/uL — ABNORMAL HIGH (ref 150–400)
RBC: 3.87 MIL/uL (ref 3.87–5.11)
RDW: 17.5 % — ABNORMAL HIGH (ref 11.5–15.5)
WBC: 9.1 10*3/uL (ref 4.0–10.5)

## 2017-02-10 LAB — BASIC METABOLIC PANEL
Anion gap: 8 (ref 5–15)
BUN: 14 mg/dL (ref 6–20)
CHLORIDE: 104 mmol/L (ref 101–111)
CO2: 29 mmol/L (ref 22–32)
CREATININE: 0.68 mg/dL (ref 0.44–1.00)
Calcium: 9.2 mg/dL (ref 8.9–10.3)
GFR calc non Af Amer: >60 mL/min (ref >60)
Glucose, Bld: 97 mg/dL (ref 65–99)
Potassium: 3.4 mmol/L — ABNORMAL LOW (ref 3.5–5.1)
Sodium: 141 mmol/L (ref 135–145)

## 2017-02-10 LAB — URINALYSIS, MICROSCOPIC (REFLEX)

## 2017-02-10 LAB — I-STAT TROPONIN, ED
Troponin i, poc: 0 ng/mL (ref 0.00–0.08)
Troponin i, poc: 0 ng/mL (ref 0.00–0.08)

## 2017-02-10 NOTE — ED Triage Notes (Signed)
Information provided BY EMS staff. Per [place] facility the Pt has not eaten in 3 days and has been saying she see her husband when he is not there. Pt also has tremors from Parkinson.

## 2017-02-10 NOTE — ED Provider Notes (Signed)
[facility] DEPT Provider Note   CSN: [id number] Arrival date & time: 02/10/17  1233     History   Chief Complaint Chief Complaint  Patient presents with  . Failure To Thrive    HPI [name] [name] is a 81 y.o. female.  81 year old Caucasian female the past medical history significant for A. fib not currently coagulated, hypertension, Parkinson's, CK D, depression that presents to the ED today by EMS from [facility] facility. Spoke with the nurses at the facility who states that about 9:00 this morning patient used her call bell to inform the nursing staff that she was having chest pain. Patient denies pain but states that she felt like her heart was having palpitations. Unsure how long the episode lasted. She denies any pain at this time. The facility have the doctor evaluate patient and that they likely need to be evaluated in the ED. Patient denies any shortness of breath with the episode. Per EMS note patient has not eaten in 3 days and she is saying she was seen her husband when he is not there. She also has tremors from Parkinson's. Pt denies these complaitnsSpoke with the nursing facility states that patient has been eating. She has been staying in bed more often at her request. She is currently being treated for Parkinson's. She has no other cardiac history. Pt is a poor historian. The nursing staff says that she is at her baseline and does not seem altered. Patient requesting sandwiches and something to drink in the room. Difficult to obtain accurate story from but states she is not having any pain at this time. She denies any headache, vision changes, lightheadedness, dizziness, abdominal pain, shortness of breath, urinary symptoms, change in bowel habits. Patient uses a wheelchair to get around the facility. Pt denies any urinary comp      Past Medical History:  Diagnosis Date  . A-fib ([place])   . Anemia   . Anxiety   . Chronic kidney disease   . Chronic pain   .  Constipation   . Depression   . Edema   . Gait disturbance   . Hypertension   . Hypokalemia   . Insomnia   . Intertrochanteric fracture ([place])   . Parkinson disease ([place])   . UTI (lower urinary tract infection)   . Vitamin D deficiency     Patient Active Problem List   Diagnosis Date Noted  . GERD (gastroesophageal reflux disease) 06/12/2016  . Depression, major, recurrent, severe with psychosis ([place]) 12/29/2015  . Chronic a-fib ([place]) 11/13/2015  . Chronic constipation 11/13/2015  . Bilateral lower extremity edema 11/13/2015  . Osteoarthritis of left knee 05/22/2015  . Insomnia 12/31/2014  . Vitamin D deficiency 04/17/2013  . Parkinson disease ([place]) 04/17/2013  . Essential hypertension, benign 04/17/2013    Past Surgical History:  Procedure Laterality Date  . right hip surgery due to fracture      OB History    No data available       Home Medications    Prior to Admission medications   Medication Sig Start Date End Date Taking? Authorizing Provider  acetaminophen (TYLENOL) 325 MG tablet Take 650 mg by mouth 3 (three) times daily. Take every day per [facility]   Yes Historical Provider, MD  antiseptic oral rinse (BIOTENE) LIQD 15 mLs by Mouth Rinse route 2 (two) times daily.   Yes Historical Provider, MD  aspirin 81 MG tablet Take 81 mg by mouth daily.   Yes Historical Provider, MD  carbidopa-levodopa (SINEMET IR) 25-100 MG per tablet Take 1 tablet by mouth 4 (four) times daily. For muscle spasms   Yes Historical Provider, MD  cholecalciferol (VITAMIN D) 1000 UNITS tablet Take 1,000 Units by mouth daily.   Yes Historical Provider, MD  diclofenac sodium (VOLTAREN) 1 % GEL Apply 4 g topically 4 (four) times daily. To knees 02/10/16  Yes Gerlene Fee, NP  diltiazem (CARDIZEM) 60 MG tablet Take 60 mg by mouth daily.   Yes Historical Provider, MD  FLUoxetine (PROZAC) 20 MG capsule Take 60 mg by mouth daily. Take 3 tablets to equal 60 mg for anxiety.   Yes Historical Provider, MD   lamoTRIgine (LAMICTAL) 150 MG tablet Take 150 mg by mouth daily.   Yes Historical Provider, MD  magnesium hydroxide (MILK OF MAGNESIA) 400 MG/5ML suspension Take 30 mLs by mouth daily as needed for mild constipation.    Yes Historical Provider, MD  Melatonin 3 MG CAPS Take 6 mg by mouth at bedtime.   Yes Historical Provider, MD  metoprolol tartrate (LOPRESSOR) 25 MG tablet Take 12.5 mg by mouth daily.    Yes Historical Provider, MD  Multiple Vitamin (DAILY VITE) TABS Take by mouth daily. Take 1 tablet   Yes Historical Provider, MD  omeprazole (PRILOSEC) 20 MG capsule Take 20 mg by mouth daily.   Yes Historical Provider, MD  permethrin (ELIMITE) 5 % cream Apply 1 application topically See admin instructions. Apply one time every Saturday   Yes Historical Provider, MD  promethazine (PHENERGAN) 25 MG tablet Take 25 mg by mouth every 6 (six) hours as needed for nausea or vomiting.   Yes Historical Provider, MD  QUEtiapine (SEROQUEL) 100 MG tablet Take 100 mg by mouth at bedtime.   Yes Historical Provider, MD  ropinirole (REQUIP) 5 MG tablet Take 5 mg by mouth at bedtime. For anxiety   Yes Historical Provider, MD  senna (SENOKOT) 8.6 MG TABS tablet Take 2 tablets by mouth 2 (two) times daily.    Yes Historical Provider, MD  torsemide (DEMADEX) 10 MG tablet Take 10 mg by mouth daily.   Yes Historical Provider, MD  UNABLE TO FIND Med Name: Med Pass 120 cc two times daily    Historical Provider, MD    Family History Family History  Problem Relation Age of Onset  . Cancer Mother   . Cancer Father     Social History Social History  Substance Use Topics  . Smoking status: Never Smoker  . Smokeless tobacco: Never Used  . Alcohol use No     Allergies   Penicillins   Review of Systems Review of Systems  Unable to perform ROS: Dementia     Physical Exam Updated Vital Signs BP 103/55   Pulse 84   Temp 98.6 F (37 C) (Oral)   Resp 20   Ht 5\' 2"  (1.575 m)   SpO2 98%   Physical Exam   Constitutional: She is oriented to person, place, and time. She appears well-developed.  Thin and appears older than stated age.  HENT:  Head: Normocephalic and atraumatic.  Right Ear: Tympanic membrane, external ear and ear canal normal.  Left Ear: Tympanic membrane, external ear and ear canal normal.  Nose: Nose normal.  Mouth/Throat: Uvula is midline, oropharynx is clear and moist and mucous membranes are normal.  Eyes: Conjunctivae and EOM are normal. Pupils are equal, round, and reactive to light. Right eye exhibits no discharge. Left eye exhibits no discharge.  Neck: Normal range of  motion. Neck supple.  Cardiovascular: Normal rate, normal heart sounds and intact distal pulses.  An irregular rhythm present. Exam reveals no gallop and no friction rub.   No murmur heard. Pulmonary/Chest: Effort normal and breath sounds normal. No respiratory distress. She has no wheezes. She has no rales. She exhibits no tenderness.  Abdominal: Soft. Bowel sounds are normal. She exhibits no distension. There is no tenderness. There is no rebound and no guarding.  Musculoskeletal: Normal range of motion. She exhibits no edema.  Is able to move all extremities Has bilateral resting tremors present   No lower extremity edema.   Neurological: She is alert and oriented to person, place, and time.  The patient is alert, attentive, and oriented x 3. Speech is clear. Cranial nerve II-VII grossly intact. Negative pronator drift. Sensation intact. Strength 5/5 in all extremities. Reflexes 2+ and symmetric at biceps, triceps, knees, and ankles. Rapid alternating movement and fine finger movements intact. Did not assess for romberg or gait due to pt uses wheelchair at baseline.    Skin: Skin is warm and dry. Capillary refill takes less than 2 seconds.  Nursing note and vitals reviewed.    ED Treatments / Results  Labs (all labs ordered are listed, but only abnormal results are displayed) Labs Reviewed    BASIC METABOLIC PANEL - Abnormal; Notable for the following:       Result Value   Potassium 3.4 (*)    All other components within normal limits  CBC WITH DIFFERENTIAL/PLATELET - Abnormal; Notable for the following:    Hemoglobin 9.7 (*)    HCT 32.4 (*)    MCH 25.1 (*)    MCHC 29.9 (*)    RDW 17.5 (*)    Platelets 413 (*)    All other components within normal limits  URINALYSIS, ROUTINE W REFLEX MICROSCOPIC - Abnormal; Notable for the following:    APPearance TURBID (*)    Ketones, ur 5 (*)    Protein, ur 100 (*)    Nitrite POSITIVE (*)    Leukocytes, UA MODERATE (*)    All other components within normal limits  URINALYSIS, MICROSCOPIC (REFLEX) - Abnormal; Notable for the following:    Bacteria, UA MANY (*)    Squamous Epithelial / LPF 6-30 (*)    All other components within normal limits  URINE CULTURE  I-STAT TROPOININ, ED  I-STAT TROPOININ, ED    EKG  EKG Interpretation  Date/Time:  Thursday February 10 2017 12:36:39 EST Ventricular Rate:  77 PR Interval:    QRS Duration: 86 QT Interval:  410 QTC Calculation: 464 R Axis:   -9 Text Interpretation:  Atrial fibrillation Borderline repol abnormality, diffuse leads similar to June 2017 Confirmed by Regenia Skeeter MD, SCOTT [phone]) on 02/10/2017 12:48:38 PM       Radiology Dg Chest 2 View  Result Date: 02/10/2017 CLINICAL DATA:  Weakness EXAM: CHEST  2 VIEW COMPARISON:  06/03/2016 FINDINGS: Mild cardiomegaly. No confluent opacities, effusions or edema. No acute bony abnormality. IMPRESSION: Mild cardiomegaly.  No active disease. Electronically Signed   By: Rolm Baptise M.D.   On: 02/10/2017 14:54   Ct Head Wo Contrast  Result Date: 02/10/2017 CLINICAL DATA:  Hallucinations. EXAM: CT HEAD WITHOUT CONTRAST TECHNIQUE: Contiguous axial images were obtained from the base of the skull through the vertex without intravenous contrast. COMPARISON:  CT scan of March 03, 2011. FINDINGS: Brain: Mild diffuse cortical atrophy is noted.  Mild chronic ischemic white matter disease is noted. No mass effect  or midline shift is noted. Ventricular size is within normal limits. There is no evidence of mass lesion, hemorrhage or acute infarction. Vascular: No hyperdense vessel or unexpected calcification. Skull: Normal. Negative for fracture or focal lesion. Sinuses/Orbits: No acute finding. Other: None. IMPRESSION: Mild diffuse cortical atrophy. Mild chronic ischemic white matter disease. No acute intracranial abnormality seen. Electronically Signed   By: Marijo Conception, M.D.   On: 02/10/2017 17:58    Procedures Procedures (including critical care time)  Medications Ordered in ED Medications - No data to display   Initial Impression / Assessment and Plan / ED Course  I have reviewed the triage vital signs and the nursing notes.  Pertinent labs & imaging results that were available during my care of the patient were reviewed by me and considered in my medical decision making (see chart for details).     Patient presents to the ED by EMS from rehabilitation facility for complaint of palpitations. Patient denies any chest pain at this time. I spoke with the facility nurse who states that patient did complain of shortness episode of chest pain at the facility. They were unable to do an EKG so had her transported to the ER. They deny patient being altered. They deny her having any other symptoms. Patient denies any shortness of breath. Patient had EKG that showed A. fib which is baseline for patient. Currently on Cardizem and not anitcoagulated with rate control. She had repeat EKG 3 hours that was unchanged. Patient had negative delta troponins. Unable to get characteristics of the chest pain per patient. Heart pathway score is a 3. Low suspicion for ACS. The patient is without any lower femur or calf tenderness. She denies any shortness of breath. Perc positive due to age but [place] low risk. Low suspicion for PE. Patient has been able to  take by mouth fluids and food here without any difficulties. She has been sleeping on my repeat exams. She continues to deny any pain. Patient at couple of low blood pressure readings however the cuff was not on the patient's arm and she was sleeping. Pressures have been stable. She does not meet SIRS criteria. Urine shows signs of infection. Patient denies any urinary symptoms. There are signs of skin with epithelium. Likely asymptomatic bacteriuria. Have culture the urine. Spoke with pharmacy and do not feel that I need to treat patient given that she does not have any symptoms. We'll await culture results. May refer to PCP for treatment of necessary. If patient develops a fever she will need to be treated. She is afebrile. Patient is not altered. She is alert and oriented 4. Facility states she is at her baseline. I have instructed the facility to have patient follow with her primary care doctor and possible cardiology referral. All other labs are unremarkable. Creatinine is normal. Hemoglobin noted to be 9.7 down from her baseline of 10. She is not symptomatically. Can be followed up with recheck hemoglobin by PCP. She is currently hemodynamically stable. Vital signs are stable. She is not tachycardic, hypoxic, tachypneic.  Will be discharged by EMS back to the facility. Strict return precautions. Patient was seen and evaluated by Dr. Regenia Skeeter who agrees with the above plan. Feels the patient does not need to be admitted.  Final Clinical Impressions(s) / ED Diagnoses   Final diagnoses:  Chest pain, unspecified type  Asymptomatic bacteriuria    New Prescriptions New Prescriptions   No medications on file     Doristine Devoid, PA-C  02/10/17 [place], MD 02/11/17 [id number]

## 2017-02-10 NOTE — ED Notes (Signed)
Inquired with Phlebotomy/Mini lab regarding delay in I-stat Troponin result. EDPA and patient updated on delay

## 2017-02-10 NOTE — Discharge Instructions (Signed)
All of patients blood work and imaging has been reassuring today. Her EKG shows no changes. Her chest xray is normal. She is eating in the ED without any difficulty. She has not had any other episodes of cp in the ED. She is currently chest pain free. Her urine shows no signs of infection. She denies any symptoms. Her urine as been cultured. If she needs antibiotics they will let her know. She need to follow up with her primary care doctor this week and may need further follow with a cardiologist if symptoms persist.  Hgb is slightly lower than normal will need her hgb rechecked.

## 2017-02-10 NOTE — ED Notes (Signed)
EKG given to Dr. Goldston  

## 2017-02-10 NOTE — ED Notes (Signed)
[facility] called for transport.  

## 2017-02-10 NOTE — ED Provider Notes (Signed)
I accidentally placed a CT scan order on this patient.  I was trying to order it for another patient.  I attempted to cancel the order at 1749 but the test had already been pulled over to radiology. I called [facility] but the test had already been completed.     Dorie Rank, MD 02/10/17 [phone]

## 2017-02-10 NOTE — ED Notes (Signed)
Pt eating sandwich and drinking ginger ale.

## 2017-02-11 DIAGNOSIS — R279 Unspecified lack of coordination: Secondary | ICD-10-CM | POA: Diagnosis not present

## 2017-02-11 DIAGNOSIS — M6281 Muscle weakness (generalized): Secondary | ICD-10-CM | POA: Diagnosis not present

## 2017-02-11 DIAGNOSIS — R1311 Dysphagia, oral phase: Secondary | ICD-10-CM | POA: Diagnosis not present

## 2017-02-11 DIAGNOSIS — G20 Parkinson's disease: Secondary | ICD-10-CM | POA: Diagnosis not present

## 2017-02-12 LAB — URINE CULTURE: Culture: >=100000 — AB

## 2017-02-13 ENCOUNTER — Telehealth: Payer: Self-pay

## 2017-02-13 NOTE — Telephone Encounter (Signed)
No treatment for UC from 02/10/17 per [facility]

## 2017-02-14 DIAGNOSIS — R279 Unspecified lack of coordination: Secondary | ICD-10-CM | POA: Diagnosis not present

## 2017-02-14 DIAGNOSIS — G20 Parkinson's disease: Secondary | ICD-10-CM | POA: Diagnosis not present

## 2017-02-14 DIAGNOSIS — M6281 Muscle weakness (generalized): Secondary | ICD-10-CM | POA: Diagnosis not present

## 2017-02-14 DIAGNOSIS — R1311 Dysphagia, oral phase: Secondary | ICD-10-CM | POA: Diagnosis not present

## 2017-02-15 DIAGNOSIS — R1311 Dysphagia, oral phase: Secondary | ICD-10-CM | POA: Diagnosis not present

## 2017-02-15 DIAGNOSIS — G20 Parkinson's disease: Secondary | ICD-10-CM | POA: Diagnosis not present

## 2017-02-15 DIAGNOSIS — M6281 Muscle weakness (generalized): Secondary | ICD-10-CM | POA: Diagnosis not present

## 2017-02-15 DIAGNOSIS — R279 Unspecified lack of coordination: Secondary | ICD-10-CM | POA: Diagnosis not present

## 2017-02-16 DIAGNOSIS — R1311 Dysphagia, oral phase: Secondary | ICD-10-CM | POA: Diagnosis not present

## 2017-02-16 DIAGNOSIS — R279 Unspecified lack of coordination: Secondary | ICD-10-CM | POA: Diagnosis not present

## 2017-02-16 DIAGNOSIS — G20 Parkinson's disease: Secondary | ICD-10-CM | POA: Diagnosis not present

## 2017-02-16 DIAGNOSIS — M6281 Muscle weakness (generalized): Secondary | ICD-10-CM | POA: Diagnosis not present

## 2017-02-17 DIAGNOSIS — R1311 Dysphagia, oral phase: Secondary | ICD-10-CM | POA: Diagnosis not present

## 2017-02-17 DIAGNOSIS — G20 Parkinson's disease: Secondary | ICD-10-CM | POA: Diagnosis not present

## 2017-02-17 DIAGNOSIS — M6281 Muscle weakness (generalized): Secondary | ICD-10-CM | POA: Diagnosis not present

## 2017-02-17 DIAGNOSIS — R279 Unspecified lack of coordination: Secondary | ICD-10-CM | POA: Diagnosis not present

## 2017-02-18 DIAGNOSIS — R1311 Dysphagia, oral phase: Secondary | ICD-10-CM | POA: Diagnosis not present

## 2017-02-18 DIAGNOSIS — M6281 Muscle weakness (generalized): Secondary | ICD-10-CM | POA: Diagnosis not present

## 2017-02-18 DIAGNOSIS — R279 Unspecified lack of coordination: Secondary | ICD-10-CM | POA: Diagnosis not present

## 2017-02-18 DIAGNOSIS — G20 Parkinson's disease: Secondary | ICD-10-CM | POA: Diagnosis not present

## 2017-02-21 DIAGNOSIS — M6281 Muscle weakness (generalized): Secondary | ICD-10-CM | POA: Diagnosis not present

## 2017-02-21 DIAGNOSIS — R279 Unspecified lack of coordination: Secondary | ICD-10-CM | POA: Diagnosis not present

## 2017-02-21 DIAGNOSIS — G20 Parkinson's disease: Secondary | ICD-10-CM | POA: Diagnosis not present

## 2017-02-21 DIAGNOSIS — R1311 Dysphagia, oral phase: Secondary | ICD-10-CM | POA: Diagnosis not present

## 2017-02-21 IMAGING — CT CT HEAD W/O CM
3 series · 16 of 47 positions shown, 19 images · non-contrast
Comparison: CT scan of March 03, 2011.

CLINICAL DATA: Hallucinations.

EXAM:
CT HEAD WITHOUT CONTRAST
TECHNIQUE: Contiguous axial images were obtained from the base of the skull
through the vertex without intravenous contrast.

[Series 2: head 5.0 h30s · axial · 0.39mm/px · z∈[-112,+13]mm · 10 of 31 slices shown, 13 images]
[im 3/31  brain]
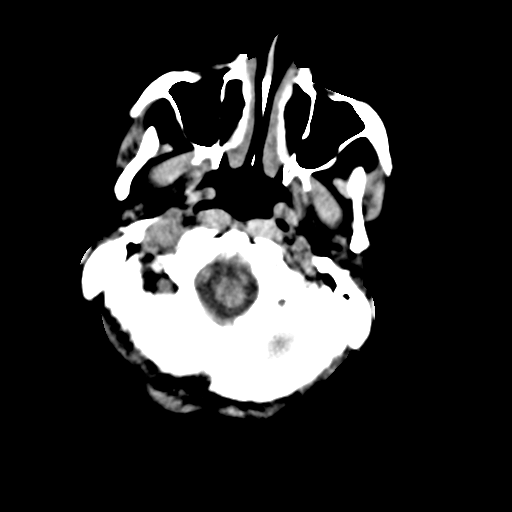
[im 3/31  bone]
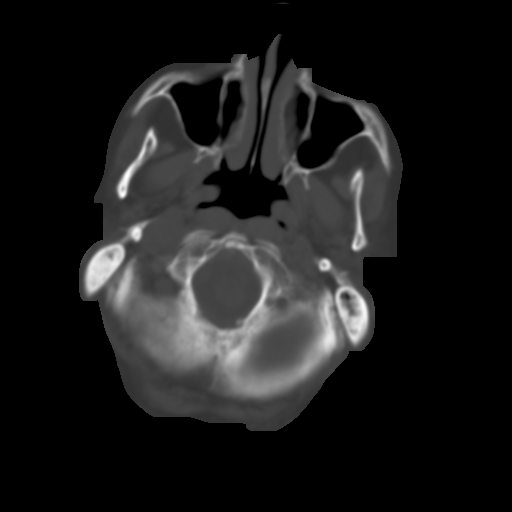
[im 6/31  brain]
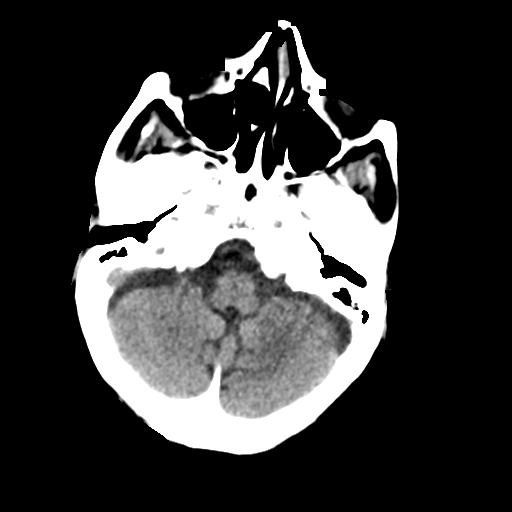
[im 9/31  brain]
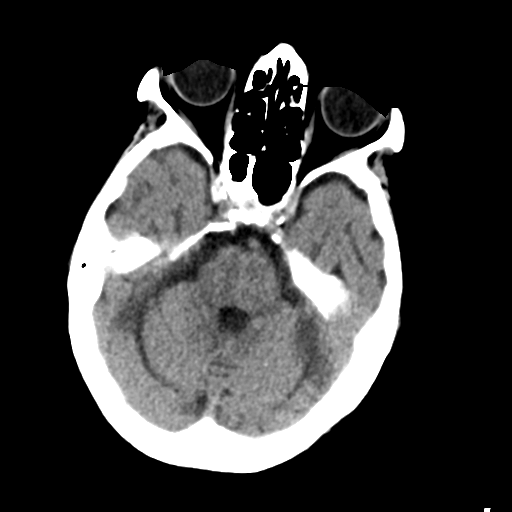
[im 11/31  brain]
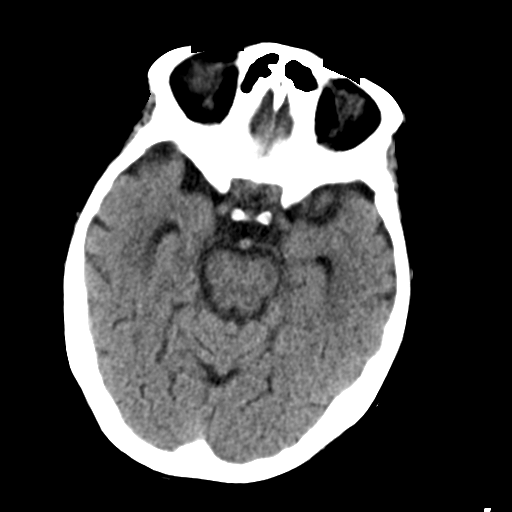
[im 14/31  brain]
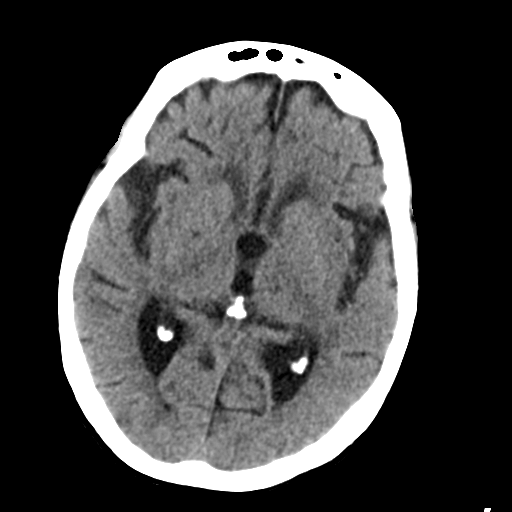
[im 14/31  bone]
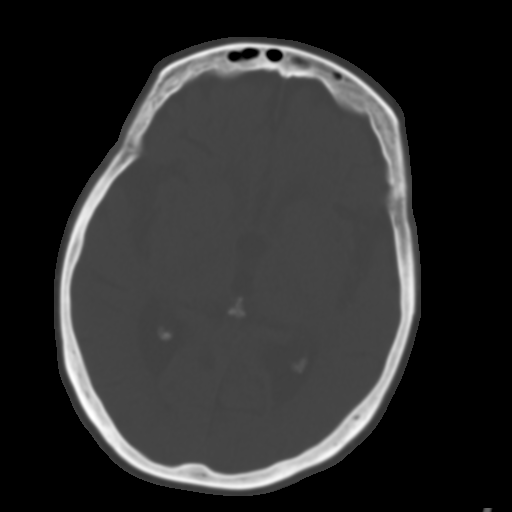
[im 17/31  brain]
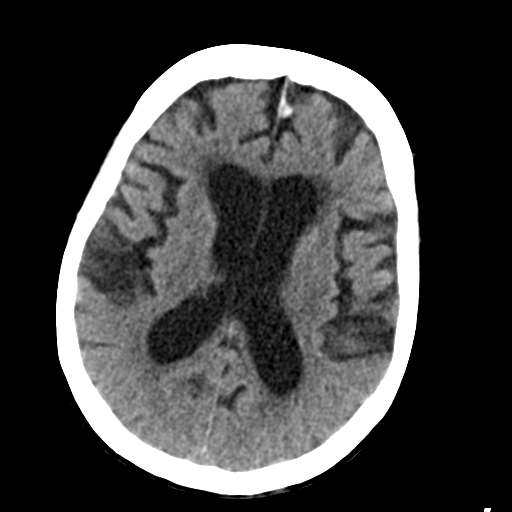
[im 20/31  brain]
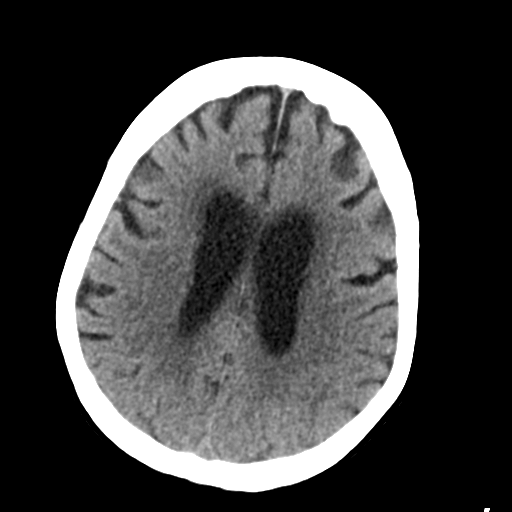
[im 23/31  brain]
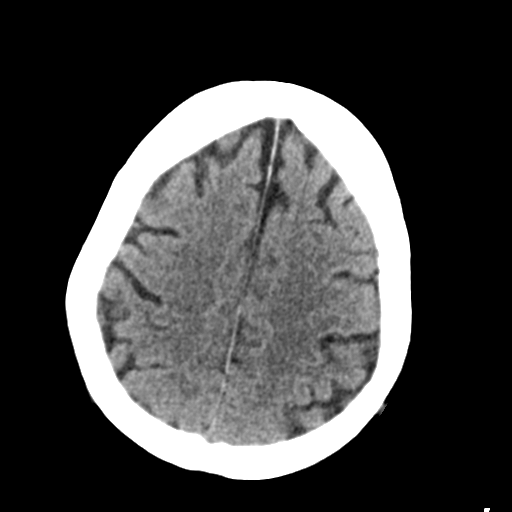
[im 25/31  brain]
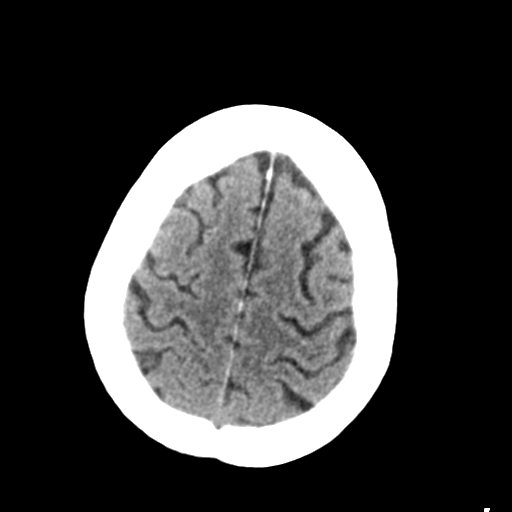
[im 25/31  bone]
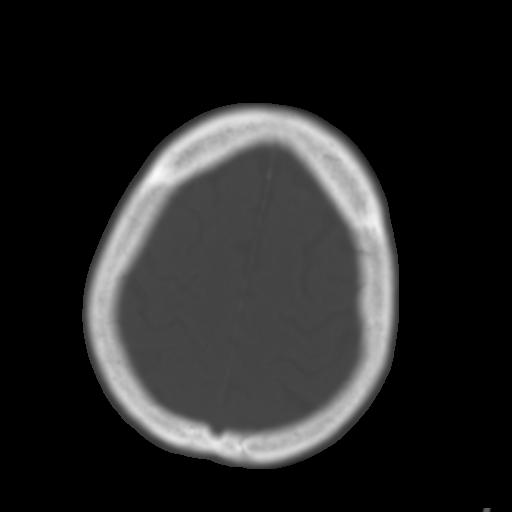
[im 28/31  brain]
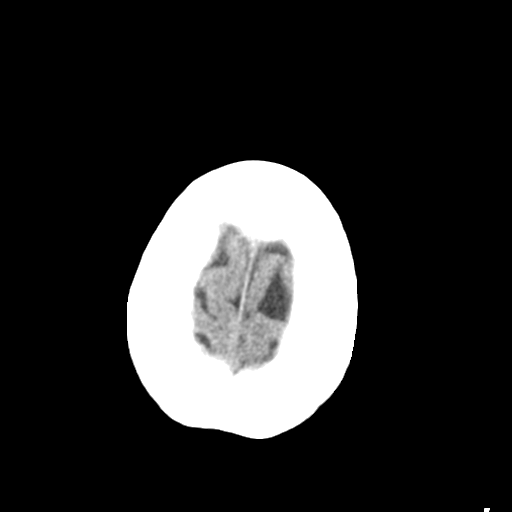

[Series 4: head 3.0 mpr cor · coronal · 0.30mm/px · 3 of 67 slices shown]
[im 23/67  brain]
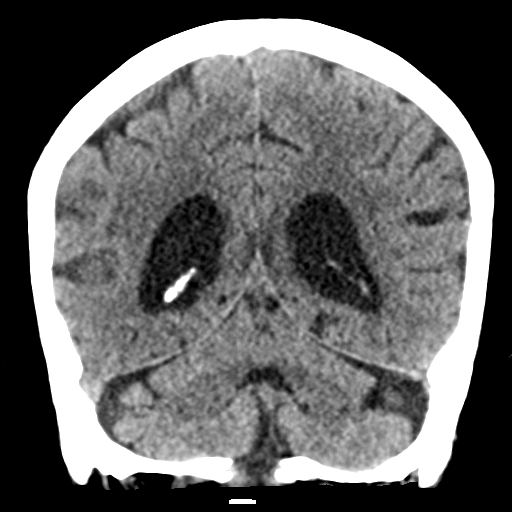
[im 30/67  brain]
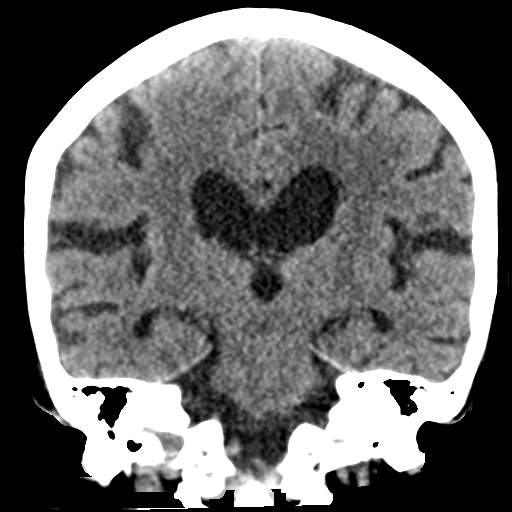
[im 37/67  brain]
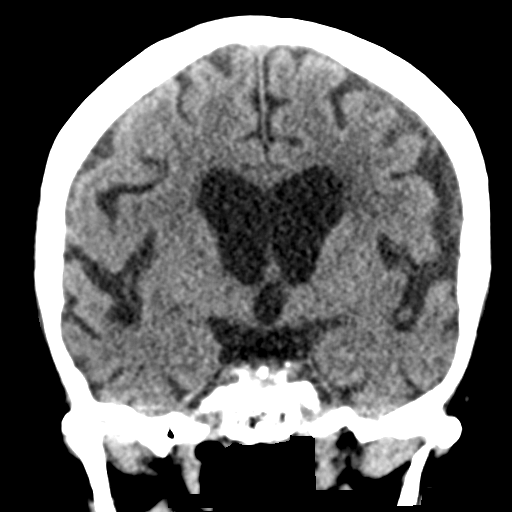

[Series 5: head 3.0 mpr sag · sagittal · 0.30mm/px · 3 of 50 slices shown]
[im 17/50  brain]
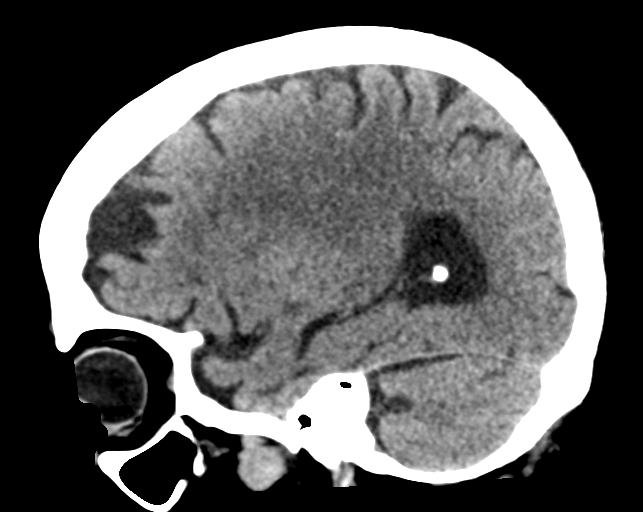
[im 25/50  brain]
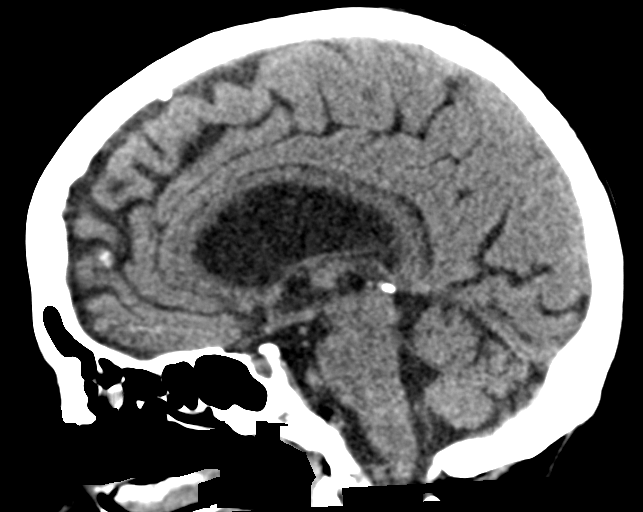
[im 33/50  brain]
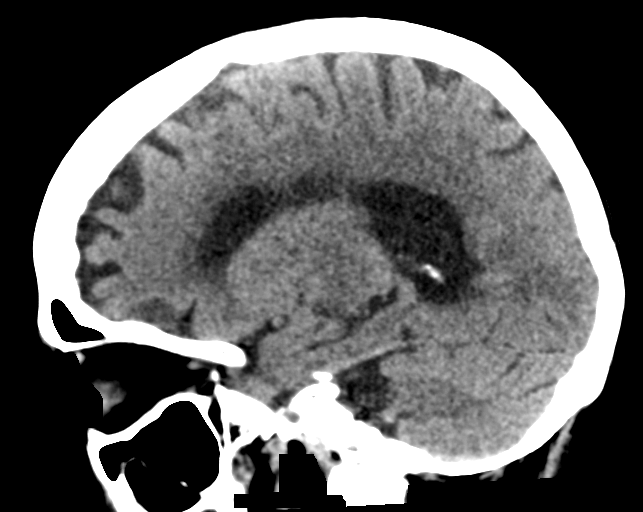

[16 of 47 positions shown; findings below may reference images not displayed]

FINDINGS: Brain: Mild diffuse cortical atrophy is noted. Mild chronic ischemic
white matter disease is noted. No mass effect or midline shift is
noted. Ventricular size is within normal limits. There is no
evidence of mass lesion, hemorrhage or acute infarction.

Vascular: No hyperdense vessel or unexpected calcification.

Skull: Normal. Negative for fracture or focal lesion.

Sinuses/Orbits: No acute finding.

Other: None.
IMPRESSION: Mild diffuse cortical atrophy. Mild chronic ischemic white matter
disease. No acute intracranial abnormality seen.

## 2017-02-21 IMAGING — DX DG CHEST 2V
2 series · 2 of 2 positions shown · non-contrast
Comparison: 06/03/2016

CLINICAL DATA: Weakness

EXAM:
CHEST  2 VIEW

[chest lat]
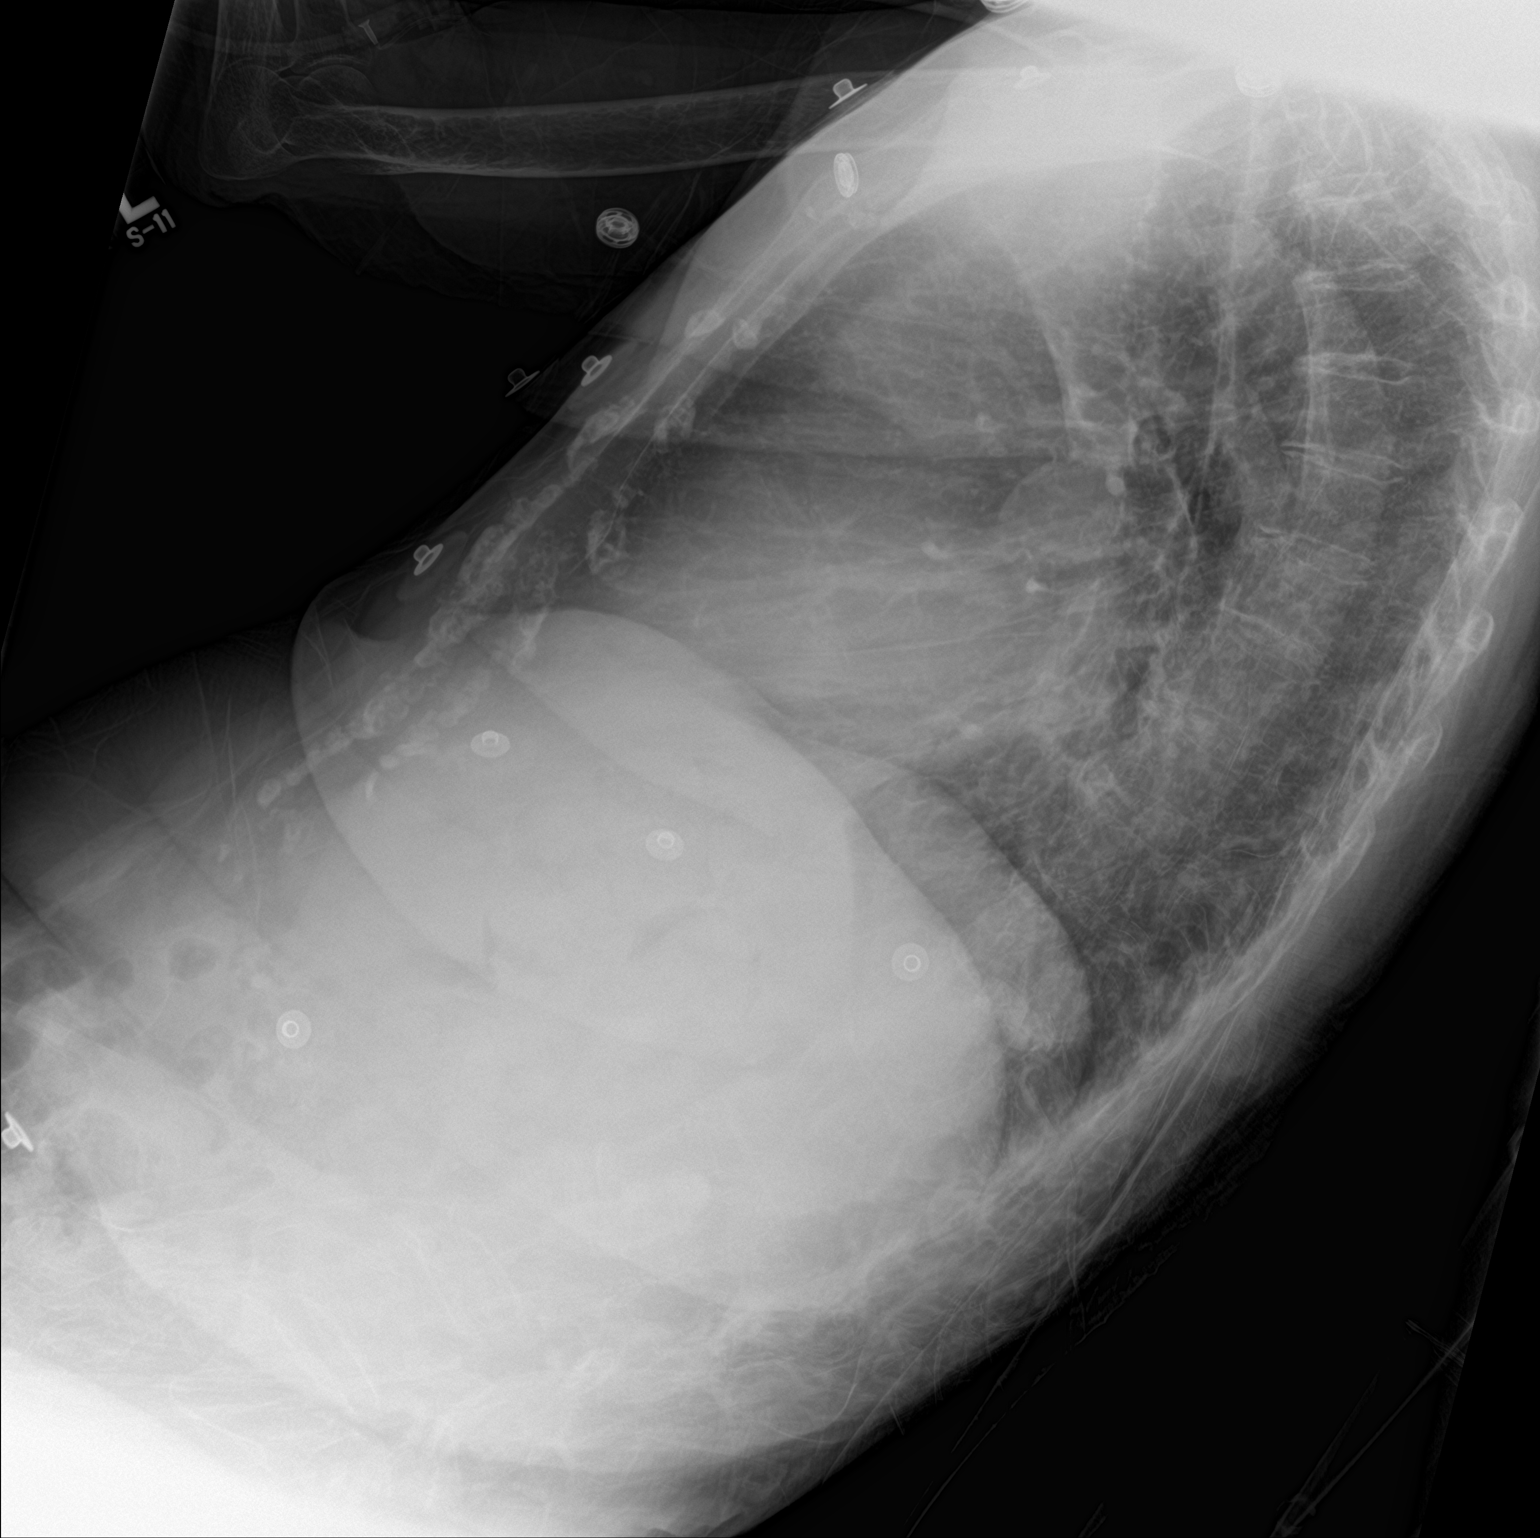

[chest ap]
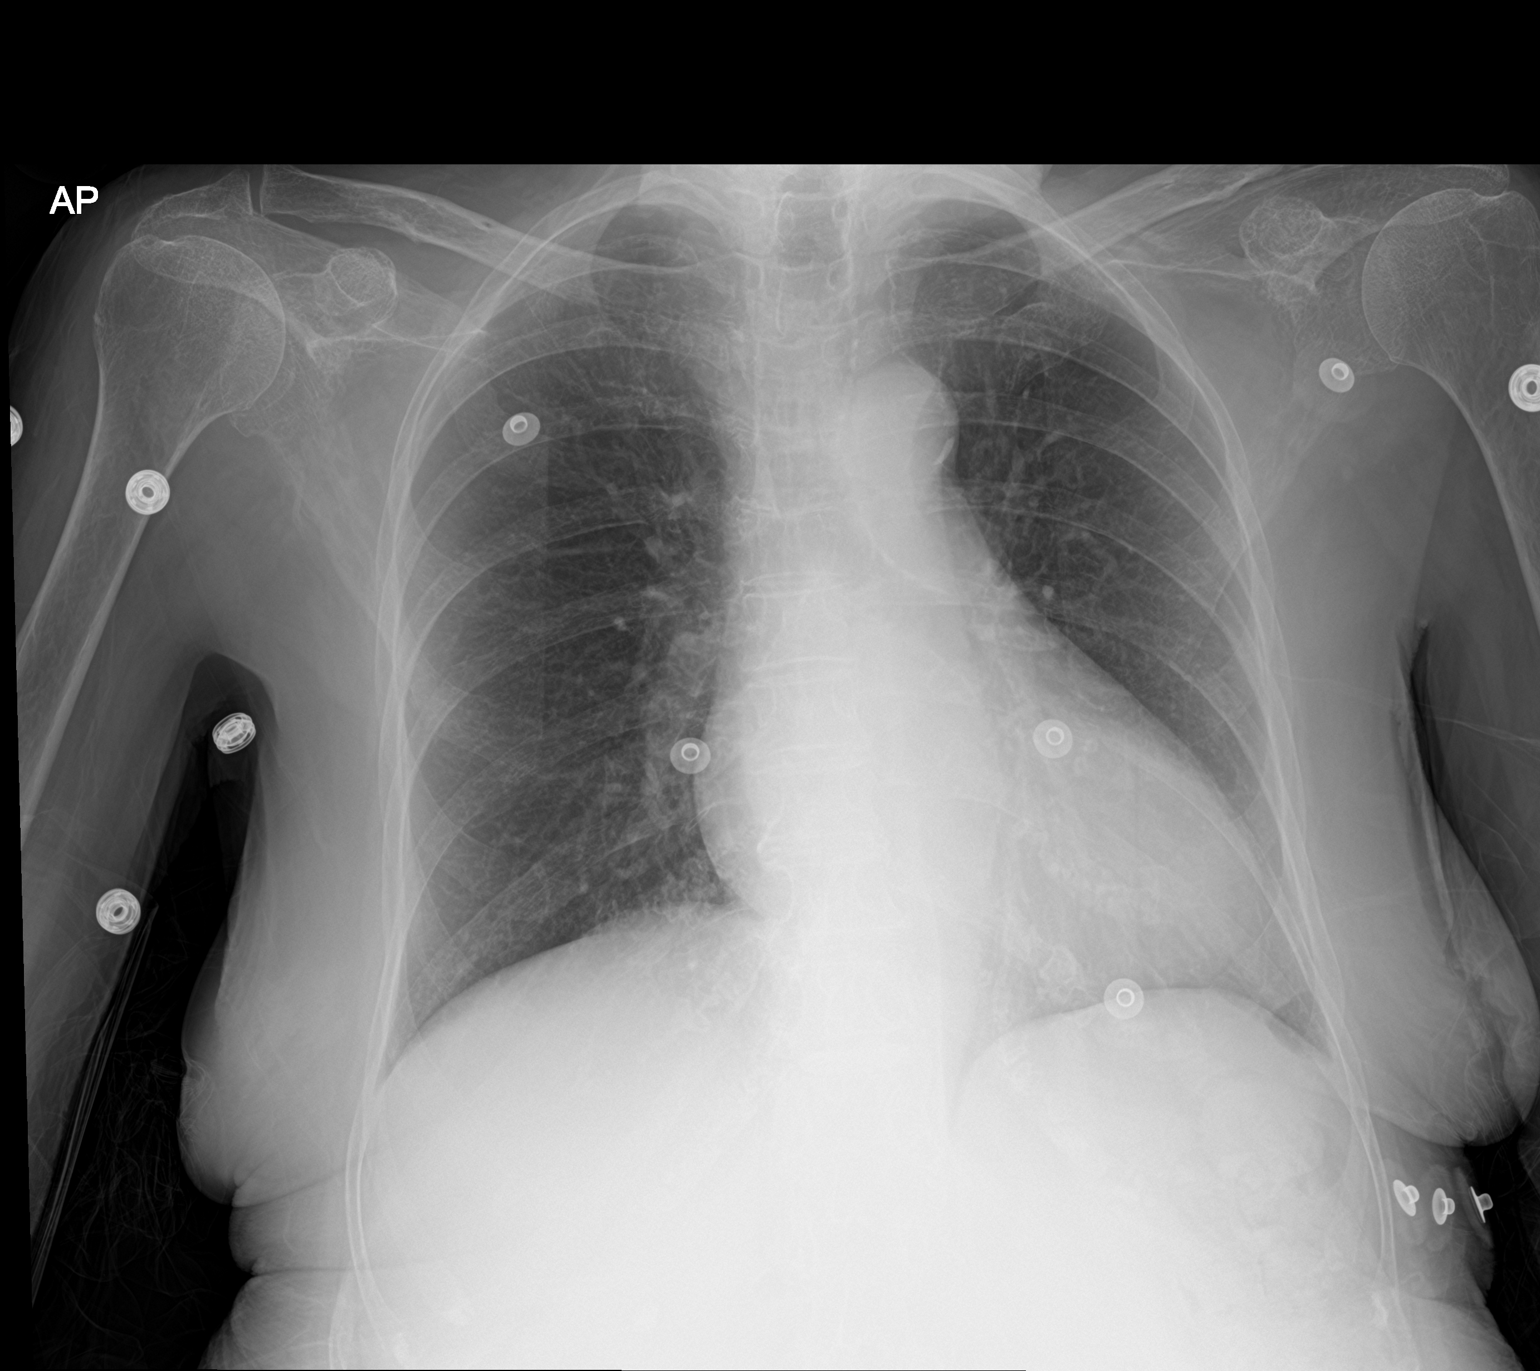

[2 of 2 positions shown; findings below may reference images not displayed]

FINDINGS: Mild cardiomegaly. No confluent opacities, effusions or edema. No
acute bony abnormality.
IMPRESSION: Mild cardiomegaly.  No active disease.

## 2017-02-22 DIAGNOSIS — G20 Parkinson's disease: Secondary | ICD-10-CM | POA: Diagnosis not present

## 2017-02-22 DIAGNOSIS — M6281 Muscle weakness (generalized): Secondary | ICD-10-CM | POA: Diagnosis not present

## 2017-02-22 DIAGNOSIS — R1311 Dysphagia, oral phase: Secondary | ICD-10-CM | POA: Diagnosis not present

## 2017-02-22 DIAGNOSIS — R279 Unspecified lack of coordination: Secondary | ICD-10-CM | POA: Diagnosis not present

## 2017-02-23 DIAGNOSIS — M6281 Muscle weakness (generalized): Secondary | ICD-10-CM | POA: Diagnosis not present

## 2017-02-23 DIAGNOSIS — G20 Parkinson's disease: Secondary | ICD-10-CM | POA: Diagnosis not present

## 2017-02-23 DIAGNOSIS — R1311 Dysphagia, oral phase: Secondary | ICD-10-CM | POA: Diagnosis not present

## 2017-02-23 DIAGNOSIS — R279 Unspecified lack of coordination: Secondary | ICD-10-CM | POA: Diagnosis not present

## 2017-02-24 DIAGNOSIS — G20 Parkinson's disease: Secondary | ICD-10-CM | POA: Diagnosis not present

## 2017-02-24 DIAGNOSIS — R1311 Dysphagia, oral phase: Secondary | ICD-10-CM | POA: Diagnosis not present

## 2017-02-24 DIAGNOSIS — M6281 Muscle weakness (generalized): Secondary | ICD-10-CM | POA: Diagnosis not present

## 2017-02-24 DIAGNOSIS — R279 Unspecified lack of coordination: Secondary | ICD-10-CM | POA: Diagnosis not present

## 2017-02-25 DIAGNOSIS — R1311 Dysphagia, oral phase: Secondary | ICD-10-CM | POA: Diagnosis not present

## 2017-02-25 DIAGNOSIS — R279 Unspecified lack of coordination: Secondary | ICD-10-CM | POA: Diagnosis not present

## 2017-02-25 DIAGNOSIS — G20 Parkinson's disease: Secondary | ICD-10-CM | POA: Diagnosis not present

## 2017-02-25 DIAGNOSIS — M6281 Muscle weakness (generalized): Secondary | ICD-10-CM | POA: Diagnosis not present

## 2017-02-28 DIAGNOSIS — G20 Parkinson's disease: Secondary | ICD-10-CM | POA: Diagnosis not present

## 2017-02-28 DIAGNOSIS — R1311 Dysphagia, oral phase: Secondary | ICD-10-CM | POA: Diagnosis not present

## 2017-02-28 DIAGNOSIS — M6281 Muscle weakness (generalized): Secondary | ICD-10-CM | POA: Diagnosis not present

## 2017-02-28 DIAGNOSIS — R279 Unspecified lack of coordination: Secondary | ICD-10-CM | POA: Diagnosis not present

## 2017-03-01 DIAGNOSIS — R279 Unspecified lack of coordination: Secondary | ICD-10-CM | POA: Diagnosis not present

## 2017-03-01 DIAGNOSIS — M6281 Muscle weakness (generalized): Secondary | ICD-10-CM | POA: Diagnosis not present

## 2017-03-01 DIAGNOSIS — R1311 Dysphagia, oral phase: Secondary | ICD-10-CM | POA: Diagnosis not present

## 2017-03-01 DIAGNOSIS — G20 Parkinson's disease: Secondary | ICD-10-CM | POA: Diagnosis not present

## 2017-03-02 DIAGNOSIS — R279 Unspecified lack of coordination: Secondary | ICD-10-CM | POA: Diagnosis not present

## 2017-03-02 DIAGNOSIS — R1311 Dysphagia, oral phase: Secondary | ICD-10-CM | POA: Diagnosis not present

## 2017-03-02 DIAGNOSIS — G20 Parkinson's disease: Secondary | ICD-10-CM | POA: Diagnosis not present

## 2017-03-02 DIAGNOSIS — M6281 Muscle weakness (generalized): Secondary | ICD-10-CM | POA: Diagnosis not present

## 2017-03-03 DIAGNOSIS — G20 Parkinson's disease: Secondary | ICD-10-CM | POA: Diagnosis not present

## 2017-03-03 DIAGNOSIS — R279 Unspecified lack of coordination: Secondary | ICD-10-CM | POA: Diagnosis not present

## 2017-03-03 DIAGNOSIS — R1311 Dysphagia, oral phase: Secondary | ICD-10-CM | POA: Diagnosis not present

## 2017-03-03 DIAGNOSIS — M6281 Muscle weakness (generalized): Secondary | ICD-10-CM | POA: Diagnosis not present

## 2017-03-04 ENCOUNTER — Encounter: Payer: Self-pay | Admitting: Adult Health

## 2017-03-04 ENCOUNTER — Non-Acute Institutional Stay (SKILLED_NURSING_FACILITY): Payer: Medicare Other | Admitting: Adult Health

## 2017-03-04 DIAGNOSIS — R279 Unspecified lack of coordination: Secondary | ICD-10-CM | POA: Diagnosis not present

## 2017-03-04 DIAGNOSIS — G20 Parkinson's disease: Secondary | ICD-10-CM | POA: Diagnosis not present

## 2017-03-04 DIAGNOSIS — I10 Essential (primary) hypertension: Secondary | ICD-10-CM | POA: Diagnosis not present

## 2017-03-04 DIAGNOSIS — K2100 Gastro-esophageal reflux disease with esophagitis, without bleeding: Secondary | ICD-10-CM

## 2017-03-04 DIAGNOSIS — R1311 Dysphagia, oral phase: Secondary | ICD-10-CM | POA: Diagnosis not present

## 2017-03-04 DIAGNOSIS — I482 Chronic atrial fibrillation, unspecified: Secondary | ICD-10-CM

## 2017-03-04 DIAGNOSIS — R634 Abnormal weight loss: Secondary | ICD-10-CM

## 2017-03-04 DIAGNOSIS — F333 Major depressive disorder, recurrent, severe with psychotic symptoms: Secondary | ICD-10-CM

## 2017-03-04 DIAGNOSIS — M6281 Muscle weakness (generalized): Secondary | ICD-10-CM | POA: Diagnosis not present

## 2017-03-04 NOTE — Progress Notes (Signed)
Location:   [facility] Room Number: [place] of Service:  SNF (31)   CODE STATUS: Full Code  Allergies  Allergen Reactions  . Penicillins     Rash Has patient had a PCN reaction causing immediate rash, facial/tongue/throat swelling, SOB or lightheadedness with hypotension:YES Has patient had a PCN reaction causing severe rash involving mucus membranes or skin necrosis: NO Has patient had a PCN reaction that required hospitalization NO Has patient had a PCN reaction occurring within the last 10 years: NO If all of the above answers are "NO", then may proceed with Cephalosporin use.    Chief Complaint  Patient presents with  . Medical Management of Chronic Issues    Routine Visit    HPI:  She is a long term resident of this facility being seen for the management of her chronic illnesses. Overall she is declining with weight loss present. She is less engaging with her surroundings. She did not want to answer any questions at this time. There are no nursing concerns at this time.    Past Medical History:  Diagnosis Date  . A-fib ([place])   . Anemia   . Anxiety   . Chronic kidney disease   . Chronic pain   . Constipation   . Depression   . Edema   . Gait disturbance   . Hypertension   . Hypokalemia   . Insomnia   . Intertrochanteric fracture ([place])   . Parkinson disease ([place])   . UTI (lower urinary tract infection)   . Vitamin D deficiency     Past Surgical History:  Procedure Laterality Date  . right hip surgery due to fracture      Social History   Social History  . Marital status: Married    Spouse name: N/A  . Number of children: N/A  . Years of education: N/A   Occupational History  . Not on file.   Social History Main Topics  . Smoking status: Never Smoker  . Smokeless tobacco: Never Used  . Alcohol use No  . Drug use: No  . Sexual activity: Not Currently   Other Topics Concern  . Not on file   Social History Narrative  . No  narrative on file   Family History  Problem Relation Age of Onset  . Cancer Mother   . Cancer Father       VITAL SIGNS BP 118/66   Pulse 78   Temp 98.6 F (37 C)   Resp 20   Ht 5' 2"  (1.575 m)   Wt 98 lb 11.2 oz (44.8 kg)   BMI 18.05 kg/m   Patient's Medications  New Prescriptions   No medications on file  Previous Medications   ACETAMINOPHEN (TYLENOL) 325 MG TABLET    Take 650 mg by mouth 3 (three) times daily. Take every day per [facility]   ANTISEPTIC ORAL RINSE (BIOTENE) LIQD    15 mLs by Mouth Rinse route 2 (two) times daily.   ASPIRIN 81 MG TABLET    Take 81 mg by mouth daily.   CARBIDOPA-LEVODOPA (SINEMET IR) 25-100 MG PER TABLET    Take 1 tablet by mouth 4 (four) times daily. For muscle spasms   CHOLECALCIFEROL (VITAMIN D) 1000 UNITS TABLET    Take 1,000 Units by mouth daily.   DEXTROMETHORPHAN-GUAIFENESIN (TUSSIN DM PO)    Take 2 tsp by mouth every 4 hours as needed   DICLOFENAC SODIUM (VOLTAREN) 1 % GEL    Apply 4  g topically 4 (four) times daily. To knees   DILTIAZEM (CARDIZEM) 60 MG TABLET    Take 60 mg by mouth daily.   FLUOXETINE (PROZAC) 20 MG CAPSULE    Take 60 mg by mouth daily. Take 3 tablets to equal 60 mg for anxiety.   LAMOTRIGINE (LAMICTAL) 150 MG TABLET    Take 150 mg by mouth daily.   MAGNESIUM HYDROXIDE (MILK OF MAGNESIA) 400 MG/5ML SUSPENSION    Take 30 mLs by mouth daily as needed for mild constipation.    MELATONIN 3 MG CAPS    Take 6 mg by mouth at bedtime.   METOPROLOL TARTRATE (LOPRESSOR) 25 MG TABLET    Take 12.5 mg by mouth daily.    MULTIPLE VITAMIN (DAILY VITE) TABS    Take by mouth daily. Take 1 tablet   OMEPRAZOLE (PRILOSEC) 20 MG CAPSULE    Take 20 mg by mouth daily.   PROMETHAZINE (PHENERGAN) 25 MG TABLET    Take 25 mg by mouth every 6 (six) hours as needed for nausea or vomiting.   QUETIAPINE (SEROQUEL) 100 MG TABLET    Take 100 mg by mouth at bedtime.   ROPINIROLE (REQUIP) 5 MG TABLET    Take 5 mg by mouth at bedtime. For anxiety   SENNA  (SENOKOT) 8.6 MG TABS TABLET    Take 2 tablets by mouth 2 (two) times daily.    TORSEMIDE (DEMADEX) 10 MG TABLET    Take 10 mg by mouth daily.   UNABLE TO FIND    Med Name: Med Pass 120 cc two times daily  Modified Medications   No medications on file  Discontinued Medications   PERMETHRIN (ELIMITE) 5 % CREAM    Apply 1 application topically See admin instructions. Apply one time every Saturday     SIGNIFICANT DIAGNOSTIC EXAMS  03-26-15: left knee x-ray: modest osteoarthritis  03-26-15: left hip with pelvis x-ray; modest osteoarthritis left hip   LABS REVIEWED:   06-03-16: wbc 9.4; hgb 10.3; hct 34.3; mcv 87.3; plt 425; glucose 94; bun 16; creat 0.77; k+ 4.2; na++ 137 07-15-16: wbc 8.8 ;hgb 10.4; hct 32; plt 470; glucose 92; bun 12; crea t0.7; k+ 4.6; na++ 137  08-31-16: wbc 8.8; hgb 10.7; hct 36.7; mcv 83.6; plt 466; glucose 130; bun 12.1; creat 0.70; k+ 4.5; na++ 139  02-03-17: wbc 9.1; hgb 9.9; hct 31.9; mcv 83.7; plt 423; glucose 91; bun 13.4; creat 0.58; k+ 4.2; na++ 138 alk phos 135; albumin 3.4    Review of Systems  Unable to perform ROS: other     Physical Exam Constitutional: No distress.  Thin   Neck: Neck supple. No JVD present. No thyromegaly present.  Breast exam negative  Cardiovascular: Normal rate and intact distal pulses.   Heart rate irregular   Respiratory: lungs clear; no respiratory distress  GI: Soft. She exhibits no distension.bowel sounds present  Musculoskeletal: She exhibits no edema.  Is able to move all extremities Has bilateral resting tremors present   Neurological: She is alert.  Skin: Skin is warm and dry. She is not diaphoretic.    ASSESSMENT/ PLAN:  1. Parkinson disease: no change in status will continue sinemet 25/100 mg four times daily and requip 5 mg daily and will monitor   2. Afib: heart rate is stable will continue asa 81 mg daily lopressor 12.5 mg daily and cardizem 60 mg daily   3. Hypertension: will continue lopressor 12.5 mg daily  and cardizem 60 mg daily  4. Constipation: will continue senna 2 tabs daily   5. Bilateral lower extremity edema: is stable will continue demedex 10 mg daily   6. Major depression: she is followed by [facility]; will continue prozac 60 mg daily; lamictal 150 mg daily to help stabilize mood;  melatonin 6 mg nightly  Will continue seroquel 100 mg daily   7. Insomnia: is without change : will continue melatonin 6 mg nightly; .   8. Left knee osteoarthritis: will continue tylenol 650 mg three times daily and voltaren gel 4 gm to knees four times daily   9. Weight loss: her weight this month is 98 pounds; in Feb 2018 was 113 pounds. At this time will continue with supplements per facility protocol and will monitor      Ok Edwards NP [facility]  Contact [phone] Monday through Friday 8am- 5pm  After hours call [phone]

## 2017-03-06 DIAGNOSIS — G20 Parkinson's disease: Secondary | ICD-10-CM | POA: Diagnosis not present

## 2017-03-06 DIAGNOSIS — M6281 Muscle weakness (generalized): Secondary | ICD-10-CM | POA: Diagnosis not present

## 2017-03-06 DIAGNOSIS — R1311 Dysphagia, oral phase: Secondary | ICD-10-CM | POA: Diagnosis not present

## 2017-03-06 DIAGNOSIS — R279 Unspecified lack of coordination: Secondary | ICD-10-CM | POA: Diagnosis not present

## 2017-03-07 DIAGNOSIS — M6281 Muscle weakness (generalized): Secondary | ICD-10-CM | POA: Diagnosis not present

## 2017-03-07 DIAGNOSIS — G20 Parkinson's disease: Secondary | ICD-10-CM | POA: Diagnosis not present

## 2017-03-07 DIAGNOSIS — R279 Unspecified lack of coordination: Secondary | ICD-10-CM | POA: Diagnosis not present

## 2017-03-07 DIAGNOSIS — R1311 Dysphagia, oral phase: Secondary | ICD-10-CM | POA: Diagnosis not present

## 2017-03-08 DIAGNOSIS — R279 Unspecified lack of coordination: Secondary | ICD-10-CM | POA: Diagnosis not present

## 2017-03-08 DIAGNOSIS — R1311 Dysphagia, oral phase: Secondary | ICD-10-CM | POA: Diagnosis not present

## 2017-03-08 DIAGNOSIS — G20 Parkinson's disease: Secondary | ICD-10-CM | POA: Diagnosis not present

## 2017-03-08 DIAGNOSIS — M6281 Muscle weakness (generalized): Secondary | ICD-10-CM | POA: Diagnosis not present

## 2017-03-09 DIAGNOSIS — G20 Parkinson's disease: Secondary | ICD-10-CM | POA: Diagnosis not present

## 2017-03-09 DIAGNOSIS — M6281 Muscle weakness (generalized): Secondary | ICD-10-CM | POA: Diagnosis not present

## 2017-03-09 DIAGNOSIS — R279 Unspecified lack of coordination: Secondary | ICD-10-CM | POA: Diagnosis not present

## 2017-03-09 DIAGNOSIS — R1311 Dysphagia, oral phase: Secondary | ICD-10-CM | POA: Diagnosis not present

## 2017-03-10 DIAGNOSIS — M6281 Muscle weakness (generalized): Secondary | ICD-10-CM | POA: Diagnosis not present

## 2017-03-10 DIAGNOSIS — G20 Parkinson's disease: Secondary | ICD-10-CM | POA: Diagnosis not present

## 2017-03-10 DIAGNOSIS — R1311 Dysphagia, oral phase: Secondary | ICD-10-CM | POA: Diagnosis not present

## 2017-03-10 DIAGNOSIS — R279 Unspecified lack of coordination: Secondary | ICD-10-CM | POA: Diagnosis not present

## 2017-03-11 ENCOUNTER — Encounter: Payer: Self-pay | Admitting: Adult Health

## 2017-03-11 ENCOUNTER — Non-Acute Institutional Stay (SKILLED_NURSING_FACILITY): Payer: Medicare Other | Admitting: Adult Health

## 2017-03-11 DIAGNOSIS — R279 Unspecified lack of coordination: Secondary | ICD-10-CM | POA: Diagnosis not present

## 2017-03-11 DIAGNOSIS — G20 Parkinson's disease: Secondary | ICD-10-CM | POA: Diagnosis not present

## 2017-03-11 DIAGNOSIS — F333 Major depressive disorder, recurrent, severe with psychotic symptoms: Secondary | ICD-10-CM | POA: Diagnosis not present

## 2017-03-11 DIAGNOSIS — R1311 Dysphagia, oral phase: Secondary | ICD-10-CM | POA: Diagnosis not present

## 2017-03-11 DIAGNOSIS — M6281 Muscle weakness (generalized): Secondary | ICD-10-CM | POA: Diagnosis not present

## 2017-03-11 DIAGNOSIS — R634 Abnormal weight loss: Secondary | ICD-10-CM

## 2017-03-11 NOTE — Progress Notes (Signed)
Location:   [place] Room Number: [place] of Service:  SNF (31)   CODE STATUS: Full Code  Allergies  Allergen Reactions  . Penicillins     Rash Has patient had a PCN reaction causing immediate rash, facial/tongue/throat swelling, SOB or lightheadedness with hypotension:YES Has patient had a PCN reaction causing severe rash involving mucus membranes or skin necrosis: NO Has patient had a PCN reaction that required hospitalization NO Has patient had a PCN reaction occurring within the last 10 years: NO If all of the above answers are "NO", then may proceed with Cephalosporin use.    Chief Complaint  Patient presents with  . Acute Visit    weight loss    HPI:  Staff reports that she is not eating and is drinking little. She is not taking her medications as well. She will not engage with staff members; she will not answer questions today. She is laying bed with her eyes closed and head turned away. She did tell me that she did not want anything done for her.   Past Medical History:  Diagnosis Date  . A-fib ([place])   . Anemia   . Anxiety   . Chronic kidney disease   . Chronic pain   . Constipation   . Depression   . Edema   . Gait disturbance   . Hypertension   . Hypokalemia   . Insomnia   . Intertrochanteric fracture ([place])   . Parkinson disease ([place])   . UTI (lower urinary tract infection)   . Vitamin D deficiency     Past Surgical History:  Procedure Laterality Date  . right hip surgery due to fracture      Social History   Social History  . Marital status: Married    Spouse name: N/A  . Number of children: N/A  . Years of education: N/A   Occupational History  . Not on file.   Social History Main Topics  . Smoking status: Never Smoker  . Smokeless tobacco: Never Used  . Alcohol use No  . Drug use: No  . Sexual activity: Not Currently   Other Topics Concern  . Not on file   Social History Narrative  . No narrative on file    Family History  Problem Relation Age of Onset  . Cancer Mother   . Cancer Father       VITAL SIGNS BP 118/66   Pulse 78   Temp 98.6 F (37 C)   Resp 20   Ht _0  (1.575 m)   Wt 98 lb 11.2 oz (44.8 kg)   SpO2 99%   BMI 18.05 kg/m   Patient's Medications  New Prescriptions   No medications on file  Previous Medications   ACETAMINOPHEN (TYLENOL) 325 MG TABLET    Take 650 mg by mouth 3 (three) times daily. Take every day per [facility]   ANTISEPTIC ORAL RINSE (BIOTENE) LIQD    15 mLs by Mouth Rinse route 2 (two) times daily.   ASPIRIN 81 MG TABLET    Take 81 mg by mouth daily.   CARBIDOPA-LEVODOPA (SINEMET IR) 25-100 MG PER TABLET    Take 1 tablet by mouth 4 (four) times daily. For muscle spasms   CHOLECALCIFEROL (VITAMIN D) 1000 UNITS TABLET    Take 1,000 Units by mouth daily.   DEXTROMETHORPHAN-GUAIFENESIN (TUSSIN DM PO)    Take 2 tsp by mouth every 4 hours as needed   DICLOFENAC SODIUM (VOLTAREN) 1 % GEL  Apply 4 g topically 4 (four) times daily. To knees   DILTIAZEM (CARDIZEM) 60 MG TABLET    Take 60 mg by mouth daily.   FLUOXETINE (PROZAC) 20 MG CAPSULE    Take 60 mg by mouth daily. Take 3 tablets to equal 60 mg for anxiety.   LAMOTRIGINE (LAMICTAL) 150 MG TABLET    Take 150 mg by mouth daily.   MAGNESIUM HYDROXIDE (MILK OF MAGNESIA) 400 MG/5ML SUSPENSION    Take 30 mLs by mouth daily as needed for mild constipation.    MELATONIN 3 MG CAPS    Take 6 mg by mouth at bedtime.   METOPROLOL TARTRATE (LOPRESSOR) 25 MG TABLET    Take 12.5 mg by mouth daily.    MULTIPLE VITAMIN (DAILY VITE) TABS    Take by mouth daily. Take 1 tablet   OMEPRAZOLE (PRILOSEC) 20 MG CAPSULE    Take 20 mg by mouth daily.   PROMETHAZINE (PHENERGAN) 25 MG TABLET    Take 25 mg by mouth every 6 (six) hours as needed for nausea or vomiting.   QUETIAPINE (SEROQUEL) 100 MG TABLET    Take 100 mg by mouth at bedtime.   ROPINIROLE (REQUIP) 5 MG TABLET    Take 5 mg by mouth every morning. For anxiety   SENNA  (SENOKOT) 8.6 MG TABS TABLET    Take 2 tablets by mouth 2 (two) times daily.    SKIN PROTECTANTS, MISC. (CALAZIME SKIN PROTECTANT EX)    Apply to buttocks twice daily   TORSEMIDE (DEMADEX) 10 MG TABLET    Take 10 mg by mouth daily.   UNABLE TO FIND    Med Name: Med Pass 120 cc two times daily  Modified Medications   No medications on file  Discontinued Medications   No medications on file     SIGNIFICANT DIAGNOSTIC EXAMS  03-26-15: left knee x-ray: modest osteoarthritis  03-26-15: left hip with pelvis x-ray; modest osteoarthritis left hip   LABS REVIEWED:   06-03-16: wbc 9.4; hgb 10.3; hct 34.3; mcv 87.3; plt 425; glucose 94; bun 16; creat 0.77; k+ 4.2; na++ 137 07-15-16: wbc 8.8 ;hgb 10.4; hct 32; plt 470; glucose 92; bun 12; crea t0.7; k+ 4.6; na++ 137  08-31-16: wbc 8.8; hgb 10.7; hct 36.7; mcv 83.6; plt 466; glucose 130; bun 12.1; creat 0.70; k+ 4.5; na++ 139  02-03-17: wbc 9.1; hgb 9.9; hct 31.9; mcv 83.7; plt 423; glucose 91; bun 13.4; creat 0.58; k+ 4.2; na++ 138 alk phos 135; albumin 3.4      Review of Systems  Unable to perform ROS: other     Physical Exam Constitutional: No distress.  Thin   Neck: Neck supple. No JVD present. No thyromegaly present.  Breast exam negative  Cardiovascular: Normal rate and intact distal pulses.   Heart rate irregular   Respiratory: lungs clear; no respiratory distress  GI: Soft. She exhibits no distension.bowel sounds present  Musculoskeletal: She exhibits no edema.  Is able to move all extremities Has bilateral resting tremors present   Neurological: She is alert.  Skin: Skin is warm and dry. She is not diaphoretic.    ASSESSMENT/ PLAN:  1. Weight loss 2. Major depression with psychosis  Will hold medications for one week in order to setup a palliative care consult and to setup a care plan meeting with her spouse and will monitor her status.    Time spent with patient  40  minutes >50% time spent counseling; reviewing medical  record; tests; labs;  and developing future plan of care   Ok Edwards NP [facility]  Contact [phone] Monday through Friday 8am- 5pm  After hours call [phone]

## 2017-03-12 DIAGNOSIS — M6281 Muscle weakness (generalized): Secondary | ICD-10-CM | POA: Diagnosis not present

## 2017-03-12 DIAGNOSIS — G20 Parkinson's disease: Secondary | ICD-10-CM | POA: Diagnosis not present

## 2017-03-12 DIAGNOSIS — R1311 Dysphagia, oral phase: Secondary | ICD-10-CM | POA: Diagnosis not present

## 2017-03-12 DIAGNOSIS — R279 Unspecified lack of coordination: Secondary | ICD-10-CM | POA: Diagnosis not present

## 2017-03-14 DIAGNOSIS — M6281 Muscle weakness (generalized): Secondary | ICD-10-CM | POA: Diagnosis not present

## 2017-03-14 DIAGNOSIS — R279 Unspecified lack of coordination: Secondary | ICD-10-CM | POA: Diagnosis not present

## 2017-03-14 DIAGNOSIS — R1311 Dysphagia, oral phase: Secondary | ICD-10-CM | POA: Diagnosis not present

## 2017-03-14 DIAGNOSIS — G20 Parkinson's disease: Secondary | ICD-10-CM | POA: Diagnosis not present

## 2017-03-15 DIAGNOSIS — R1311 Dysphagia, oral phase: Secondary | ICD-10-CM | POA: Diagnosis not present

## 2017-03-15 DIAGNOSIS — M6281 Muscle weakness (generalized): Secondary | ICD-10-CM | POA: Diagnosis not present

## 2017-03-15 DIAGNOSIS — G20 Parkinson's disease: Secondary | ICD-10-CM | POA: Diagnosis not present

## 2017-03-15 DIAGNOSIS — R279 Unspecified lack of coordination: Secondary | ICD-10-CM | POA: Diagnosis not present

## 2017-03-16 ENCOUNTER — Non-Acute Institutional Stay (SKILLED_NURSING_FACILITY): Payer: Medicare Other | Admitting: Adult Health

## 2017-03-16 ENCOUNTER — Encounter: Payer: Self-pay | Admitting: Adult Health

## 2017-03-16 DIAGNOSIS — F329 Major depressive disorder, single episode, unspecified: Secondary | ICD-10-CM

## 2017-03-16 DIAGNOSIS — M6281 Muscle weakness (generalized): Secondary | ICD-10-CM | POA: Diagnosis not present

## 2017-03-16 DIAGNOSIS — F0153 Vascular dementia, unspecified severity, with mood disturbance: Secondary | ICD-10-CM | POA: Insufficient documentation

## 2017-03-16 DIAGNOSIS — G20 Parkinson's disease: Secondary | ICD-10-CM | POA: Diagnosis not present

## 2017-03-16 DIAGNOSIS — R1311 Dysphagia, oral phase: Secondary | ICD-10-CM | POA: Diagnosis not present

## 2017-03-16 DIAGNOSIS — R634 Abnormal weight loss: Secondary | ICD-10-CM | POA: Diagnosis not present

## 2017-03-16 DIAGNOSIS — R627 Adult failure to thrive: Secondary | ICD-10-CM | POA: Insufficient documentation

## 2017-03-16 DIAGNOSIS — F0150 Vascular dementia without behavioral disturbance: Secondary | ICD-10-CM

## 2017-03-16 DIAGNOSIS — R279 Unspecified lack of coordination: Secondary | ICD-10-CM | POA: Diagnosis not present

## 2017-03-16 DIAGNOSIS — F333 Major depressive disorder, recurrent, severe with psychotic symptoms: Secondary | ICD-10-CM

## 2017-03-16 NOTE — Progress Notes (Signed)
Location:   [place] Room Number: [place] of Service:  SNF (31)   CODE STATUS: DNR  Allergies  Allergen Reactions  . Penicillins     Rash Has patient had a PCN reaction causing immediate rash, facial/tongue/throat swelling, SOB or lightheadedness with hypotension:YES Has patient had a PCN reaction causing severe rash involving mucus membranes or skin necrosis: NO Has patient had a PCN reaction that required hospitalization NO Has patient had a PCN reaction occurring within the last 10 years: NO If all of the above answers are "NO", then may proceed with Cephalosporin use.    Chief Complaint  Patient presents with  . Acute Visit    Family counseling    HPI:  She is continuing to decline; she is not eating and with a poor fluid intake.  She is losing weight from 113 pounds to her current weight of 98 pounds. She is withdrawn and is less engaging with her surroundings. I have met with the social worker and her spouse regarding her overall status. She does not want cpr; does not want to go back to the hospital. She and her spouse have decided to enroll in hospice care.     Past Medical History:  Diagnosis Date  . A-fib ([place])   . Anemia   . Anxiety   . Chronic kidney disease   . Chronic pain   . Constipation   . Depression   . Edema   . Gait disturbance   . Hypertension   . Hypokalemia   . Insomnia   . Intertrochanteric fracture ([place])   . Parkinson disease ([place])   . UTI (lower urinary tract infection)   . Vitamin D deficiency     Past Surgical History:  Procedure Laterality Date  . right hip surgery due to fracture      Social History   Social History  . Marital status: Married    Spouse name: N/A  . Number of children: N/A  . Years of education: N/A   Occupational History  . Not on file.   Social History Main Topics  . Smoking status: Never Smoker  . Smokeless tobacco: Never Used  . Alcohol use No  . Drug use: No  . Sexual  activity: Not Currently   Other Topics Concern  . Not on file   Social History Narrative  . No narrative on file   Family History  Problem Relation Age of Onset  . Cancer Mother   . Cancer Father       VITAL SIGNS BP 108/67   Pulse 78   Temp 97.7 F (36.5 C)   Resp 20   Ht 5' 2"  (1.575 m)   Wt 98 lb 11.2 oz (44.8 kg)   SpO2 99%   BMI 18.05 kg/m   Patient's Medications  New Prescriptions   No medications on file  Previous Medications   No medications on file  Modified Medications   No medications on file  Discontinued Medications   ACETAMINOPHEN (TYLENOL) 325 MG TABLET    Take 650 mg by mouth 3 (three) times daily. Take every day per [facility]   ANTISEPTIC ORAL RINSE (BIOTENE) LIQD    15 mLs by Mouth Rinse route 2 (two) times daily.   ASPIRIN 81 MG TABLET    Take 81 mg by mouth daily.   CARBIDOPA-LEVODOPA (SINEMET IR) 25-100 MG PER TABLET    Take 1 tablet by mouth 4 (four) times daily. For muscle spasms   CHOLECALCIFEROL (  VITAMIN D) 1000 UNITS TABLET    Take 1,000 Units by mouth daily.   DEXTROMETHORPHAN-GUAIFENESIN (TUSSIN DM PO)    Take 2 tsp by mouth every 4 hours as needed   DICLOFENAC SODIUM (VOLTAREN) 1 % GEL    Apply 4 g topically 4 (four) times daily. To knees   DILTIAZEM (CARDIZEM) 60 MG TABLET    Take 60 mg by mouth daily.   FLUOXETINE (PROZAC) 20 MG CAPSULE    Take 60 mg by mouth daily. Take 3 tablets to equal 60 mg for anxiety.   LAMOTRIGINE (LAMICTAL) 150 MG TABLET    Take 150 mg by mouth daily.   MAGNESIUM HYDROXIDE (MILK OF MAGNESIA) 400 MG/5ML SUSPENSION    Take 30 mLs by mouth daily as needed for mild constipation.    MELATONIN 3 MG CAPS    Take 6 mg by mouth at bedtime.   METOPROLOL TARTRATE (LOPRESSOR) 25 MG TABLET    Take 12.5 mg by mouth daily.    MULTIPLE VITAMIN (DAILY VITE) TABS    Take by mouth daily. Take 1 tablet   OMEPRAZOLE (PRILOSEC) 20 MG CAPSULE    Take 20 mg by mouth daily.   PROMETHAZINE (PHENERGAN) 25 MG TABLET    Take 25 mg by mouth  every 6 (six) hours as needed for nausea or vomiting.   QUETIAPINE (SEROQUEL) 100 MG TABLET    Take 100 mg by mouth at bedtime.   ROPINIROLE (REQUIP) 5 MG TABLET    Take 5 mg by mouth every morning. For anxiety   SENNA (SENOKOT) 8.6 MG TABS TABLET    Take 2 tablets by mouth 2 (two) times daily.    SKIN PROTECTANTS, MISC. (CALAZIME SKIN PROTECTANT EX)    Apply to buttocks twice daily   TORSEMIDE (DEMADEX) 10 MG TABLET    Take 10 mg by mouth daily.   UNABLE TO FIND    Med Name: Med Pass 120 cc two times daily     SIGNIFICANT DIAGNOSTIC EXAMS  03-26-15: left knee x-ray: modest osteoarthritis  03-26-15: left hip with pelvis x-ray; modest osteoarthritis left hip   LABS REVIEWED:   06-03-16: wbc 9.4; hgb 10.3; hct 34.3; mcv 87.3; plt 425; glucose 94; bun 16; creat 0.77; k+ 4.2; na++ 137 07-15-16: wbc 8.8 ;hgb 10.4; hct 32; plt 470; glucose 92; bun 12; crea t0.7; k+ 4.6; na++ 137  08-31-16: wbc 8.8; hgb 10.7; hct 36.7; mcv 83.6; plt 466; glucose 130; bun 12.1; creat 0.70; k+ 4.5; na++ 139  02-03-17: wbc 9.1; hgb 9.9; hct 31.9; mcv 83.7; plt 423; glucose 91; bun 13.4; creat 0.58; k+ 4.2; na++ 138 alk phos 135; albumin 3.4      Review of Systems  Unable to perform ROS: other     Physical Exam Constitutional: No distress.  Thin   Neck: Neck supple. No JVD present. No thyromegaly present.  Breast exam negative  Cardiovascular: Normal rate and intact distal pulses.   Heart rate irregular   Respiratory: lungs clear; no respiratory distress  GI: Soft. She exhibits no distension.bowel sounds present  Musculoskeletal: She exhibits no edema.  Is able to move all extremities Has bilateral resting tremors present   Neurological: She is alert.  Skin: Skin is warm and dry. She is not diaphoretic.    ASSESSMENT/ PLAN:  1. Dementia 2. Failure to thrive 3. Weight loss 4. Recurrent major depression severe with psychosis  Her most form has been filled and the dnr form initiated  Will stop all  medications per her request Will use voltaren gel as needed to her knees Will begin remeron 7.5 mg nightly for 2 weeks for appetite Will setup a hospice consult.    Time spent with patient  40  minutes >50% time spent counseling; reviewing medical record; tests; labs; and developing future plan of care  MD is aware of resident's narcotic use and is in agreement with current plan of care. We will attempt to wean resident as apropriate   Ok Edwards NP [facility]  Contact [phone] Monday through Friday 8am- 5pm  After hours call [phone]

## 2017-03-17 DIAGNOSIS — N189 Chronic kidney disease, unspecified: Secondary | ICD-10-CM | POA: Diagnosis not present

## 2017-03-17 DIAGNOSIS — R1310 Dysphagia, unspecified: Secondary | ICD-10-CM | POA: Diagnosis not present

## 2017-03-17 DIAGNOSIS — R279 Unspecified lack of coordination: Secondary | ICD-10-CM | POA: Diagnosis not present

## 2017-03-17 DIAGNOSIS — K219 Gastro-esophageal reflux disease without esophagitis: Secondary | ICD-10-CM | POA: Diagnosis not present

## 2017-03-17 DIAGNOSIS — M6281 Muscle weakness (generalized): Secondary | ICD-10-CM | POA: Diagnosis not present

## 2017-03-17 DIAGNOSIS — M2450 Contracture, unspecified joint: Secondary | ICD-10-CM | POA: Diagnosis not present

## 2017-03-17 DIAGNOSIS — I4891 Unspecified atrial fibrillation: Secondary | ICD-10-CM | POA: Diagnosis not present

## 2017-03-17 DIAGNOSIS — R1311 Dysphagia, oral phase: Secondary | ICD-10-CM | POA: Diagnosis not present

## 2017-03-17 DIAGNOSIS — G20 Parkinson's disease: Secondary | ICD-10-CM | POA: Diagnosis not present

## 2017-03-17 DIAGNOSIS — I10 Essential (primary) hypertension: Secondary | ICD-10-CM | POA: Diagnosis not present

## 2017-03-18 DIAGNOSIS — M6281 Muscle weakness (generalized): Secondary | ICD-10-CM | POA: Diagnosis not present

## 2017-03-18 DIAGNOSIS — M2450 Contracture, unspecified joint: Secondary | ICD-10-CM | POA: Diagnosis not present

## 2017-03-18 DIAGNOSIS — R279 Unspecified lack of coordination: Secondary | ICD-10-CM | POA: Diagnosis not present

## 2017-03-18 DIAGNOSIS — I4891 Unspecified atrial fibrillation: Secondary | ICD-10-CM | POA: Diagnosis not present

## 2017-03-18 DIAGNOSIS — N189 Chronic kidney disease, unspecified: Secondary | ICD-10-CM | POA: Diagnosis not present

## 2017-03-18 DIAGNOSIS — K219 Gastro-esophageal reflux disease without esophagitis: Secondary | ICD-10-CM | POA: Diagnosis not present

## 2017-03-18 DIAGNOSIS — G20 Parkinson's disease: Secondary | ICD-10-CM | POA: Diagnosis not present

## 2017-03-18 DIAGNOSIS — I10 Essential (primary) hypertension: Secondary | ICD-10-CM | POA: Diagnosis not present

## 2017-03-18 DIAGNOSIS — R1310 Dysphagia, unspecified: Secondary | ICD-10-CM | POA: Diagnosis not present

## 2017-03-18 DIAGNOSIS — R1311 Dysphagia, oral phase: Secondary | ICD-10-CM | POA: Diagnosis not present

## 2017-03-19 DIAGNOSIS — G20 Parkinson's disease: Secondary | ICD-10-CM | POA: Diagnosis not present

## 2017-03-19 DIAGNOSIS — I10 Essential (primary) hypertension: Secondary | ICD-10-CM | POA: Diagnosis not present

## 2017-03-19 DIAGNOSIS — N189 Chronic kidney disease, unspecified: Secondary | ICD-10-CM | POA: Diagnosis not present

## 2017-03-19 DIAGNOSIS — M2450 Contracture, unspecified joint: Secondary | ICD-10-CM | POA: Diagnosis not present

## 2017-03-19 DIAGNOSIS — K219 Gastro-esophageal reflux disease without esophagitis: Secondary | ICD-10-CM | POA: Diagnosis not present

## 2017-03-19 DIAGNOSIS — I4891 Unspecified atrial fibrillation: Secondary | ICD-10-CM | POA: Diagnosis not present

## 2017-03-19 DIAGNOSIS — R1310 Dysphagia, unspecified: Secondary | ICD-10-CM | POA: Diagnosis not present

## 2017-03-20 DIAGNOSIS — I10 Essential (primary) hypertension: Secondary | ICD-10-CM | POA: Diagnosis not present

## 2017-03-20 DIAGNOSIS — M2450 Contracture, unspecified joint: Secondary | ICD-10-CM | POA: Diagnosis not present

## 2017-03-20 DIAGNOSIS — K219 Gastro-esophageal reflux disease without esophagitis: Secondary | ICD-10-CM | POA: Diagnosis not present

## 2017-03-20 DIAGNOSIS — R1310 Dysphagia, unspecified: Secondary | ICD-10-CM | POA: Diagnosis not present

## 2017-03-20 DIAGNOSIS — N189 Chronic kidney disease, unspecified: Secondary | ICD-10-CM | POA: Diagnosis not present

## 2017-03-20 DIAGNOSIS — G20 Parkinson's disease: Secondary | ICD-10-CM | POA: Diagnosis not present

## 2017-03-20 DIAGNOSIS — I4891 Unspecified atrial fibrillation: Secondary | ICD-10-CM | POA: Diagnosis not present

## 2017-03-21 DIAGNOSIS — R1310 Dysphagia, unspecified: Secondary | ICD-10-CM | POA: Diagnosis not present

## 2017-03-21 DIAGNOSIS — I10 Essential (primary) hypertension: Secondary | ICD-10-CM | POA: Diagnosis not present

## 2017-03-21 DIAGNOSIS — I4891 Unspecified atrial fibrillation: Secondary | ICD-10-CM | POA: Diagnosis not present

## 2017-03-21 DIAGNOSIS — K219 Gastro-esophageal reflux disease without esophagitis: Secondary | ICD-10-CM | POA: Diagnosis not present

## 2017-03-21 DIAGNOSIS — N189 Chronic kidney disease, unspecified: Secondary | ICD-10-CM | POA: Diagnosis not present

## 2017-03-21 DIAGNOSIS — M2450 Contracture, unspecified joint: Secondary | ICD-10-CM | POA: Diagnosis not present

## 2017-03-21 DIAGNOSIS — G20 Parkinson's disease: Secondary | ICD-10-CM | POA: Diagnosis not present

## 2017-03-22 DIAGNOSIS — M2450 Contracture, unspecified joint: Secondary | ICD-10-CM | POA: Diagnosis not present

## 2017-03-22 DIAGNOSIS — I4891 Unspecified atrial fibrillation: Secondary | ICD-10-CM | POA: Diagnosis not present

## 2017-03-22 DIAGNOSIS — R1310 Dysphagia, unspecified: Secondary | ICD-10-CM | POA: Diagnosis not present

## 2017-03-22 DIAGNOSIS — I10 Essential (primary) hypertension: Secondary | ICD-10-CM | POA: Diagnosis not present

## 2017-03-22 DIAGNOSIS — K219 Gastro-esophageal reflux disease without esophagitis: Secondary | ICD-10-CM | POA: Diagnosis not present

## 2017-03-22 DIAGNOSIS — N189 Chronic kidney disease, unspecified: Secondary | ICD-10-CM | POA: Diagnosis not present

## 2017-03-22 DIAGNOSIS — G20 Parkinson's disease: Secondary | ICD-10-CM | POA: Diagnosis not present

## 2017-03-23 DIAGNOSIS — I4891 Unspecified atrial fibrillation: Secondary | ICD-10-CM | POA: Diagnosis not present

## 2017-03-23 DIAGNOSIS — G20 Parkinson's disease: Secondary | ICD-10-CM | POA: Diagnosis not present

## 2017-03-23 DIAGNOSIS — R1310 Dysphagia, unspecified: Secondary | ICD-10-CM | POA: Diagnosis not present

## 2017-03-23 DIAGNOSIS — I10 Essential (primary) hypertension: Secondary | ICD-10-CM | POA: Diagnosis not present

## 2017-03-23 DIAGNOSIS — K219 Gastro-esophageal reflux disease without esophagitis: Secondary | ICD-10-CM | POA: Diagnosis not present

## 2017-03-23 DIAGNOSIS — M2450 Contracture, unspecified joint: Secondary | ICD-10-CM | POA: Diagnosis not present

## 2017-03-23 DIAGNOSIS — N189 Chronic kidney disease, unspecified: Secondary | ICD-10-CM | POA: Diagnosis not present

## 2017-03-24 DIAGNOSIS — M2450 Contracture, unspecified joint: Secondary | ICD-10-CM | POA: Diagnosis not present

## 2017-03-24 DIAGNOSIS — G20 Parkinson's disease: Secondary | ICD-10-CM | POA: Diagnosis not present

## 2017-03-24 DIAGNOSIS — K219 Gastro-esophageal reflux disease without esophagitis: Secondary | ICD-10-CM | POA: Diagnosis not present

## 2017-03-24 DIAGNOSIS — I10 Essential (primary) hypertension: Secondary | ICD-10-CM | POA: Diagnosis not present

## 2017-03-24 DIAGNOSIS — R1310 Dysphagia, unspecified: Secondary | ICD-10-CM | POA: Diagnosis not present

## 2017-03-24 DIAGNOSIS — N189 Chronic kidney disease, unspecified: Secondary | ICD-10-CM | POA: Diagnosis not present

## 2017-03-24 DIAGNOSIS — I4891 Unspecified atrial fibrillation: Secondary | ICD-10-CM | POA: Diagnosis not present

## 2017-03-25 DIAGNOSIS — N189 Chronic kidney disease, unspecified: Secondary | ICD-10-CM | POA: Diagnosis not present

## 2017-03-25 DIAGNOSIS — I10 Essential (primary) hypertension: Secondary | ICD-10-CM | POA: Diagnosis not present

## 2017-03-25 DIAGNOSIS — M2450 Contracture, unspecified joint: Secondary | ICD-10-CM | POA: Diagnosis not present

## 2017-03-25 DIAGNOSIS — I4891 Unspecified atrial fibrillation: Secondary | ICD-10-CM | POA: Diagnosis not present

## 2017-03-25 DIAGNOSIS — R1310 Dysphagia, unspecified: Secondary | ICD-10-CM | POA: Diagnosis not present

## 2017-03-25 DIAGNOSIS — G20 Parkinson's disease: Secondary | ICD-10-CM | POA: Diagnosis not present

## 2017-03-25 DIAGNOSIS — K219 Gastro-esophageal reflux disease without esophagitis: Secondary | ICD-10-CM | POA: Diagnosis not present

## 2017-03-26 DIAGNOSIS — G20 Parkinson's disease: Secondary | ICD-10-CM | POA: Diagnosis not present

## 2017-03-26 DIAGNOSIS — R1310 Dysphagia, unspecified: Secondary | ICD-10-CM | POA: Diagnosis not present

## 2017-03-26 DIAGNOSIS — I10 Essential (primary) hypertension: Secondary | ICD-10-CM | POA: Diagnosis not present

## 2017-03-26 DIAGNOSIS — K219 Gastro-esophageal reflux disease without esophagitis: Secondary | ICD-10-CM | POA: Diagnosis not present

## 2017-03-26 DIAGNOSIS — N189 Chronic kidney disease, unspecified: Secondary | ICD-10-CM | POA: Diagnosis not present

## 2017-03-26 DIAGNOSIS — M2450 Contracture, unspecified joint: Secondary | ICD-10-CM | POA: Diagnosis not present

## 2017-03-26 DIAGNOSIS — I4891 Unspecified atrial fibrillation: Secondary | ICD-10-CM | POA: Diagnosis not present

## 2017-03-27 DIAGNOSIS — N189 Chronic kidney disease, unspecified: Secondary | ICD-10-CM | POA: Diagnosis not present

## 2017-03-27 DIAGNOSIS — M2450 Contracture, unspecified joint: Secondary | ICD-10-CM | POA: Diagnosis not present

## 2017-03-27 DIAGNOSIS — R1310 Dysphagia, unspecified: Secondary | ICD-10-CM | POA: Diagnosis not present

## 2017-03-27 DIAGNOSIS — K219 Gastro-esophageal reflux disease without esophagitis: Secondary | ICD-10-CM | POA: Diagnosis not present

## 2017-03-27 DIAGNOSIS — I4891 Unspecified atrial fibrillation: Secondary | ICD-10-CM | POA: Diagnosis not present

## 2017-03-27 DIAGNOSIS — G20 Parkinson's disease: Secondary | ICD-10-CM | POA: Diagnosis not present

## 2017-03-27 DIAGNOSIS — I10 Essential (primary) hypertension: Secondary | ICD-10-CM | POA: Diagnosis not present

## 2017-03-28 DIAGNOSIS — G20 Parkinson's disease: Secondary | ICD-10-CM | POA: Diagnosis not present

## 2017-03-28 DIAGNOSIS — I4891 Unspecified atrial fibrillation: Secondary | ICD-10-CM | POA: Diagnosis not present

## 2017-03-28 DIAGNOSIS — M2450 Contracture, unspecified joint: Secondary | ICD-10-CM | POA: Diagnosis not present

## 2017-03-28 DIAGNOSIS — I10 Essential (primary) hypertension: Secondary | ICD-10-CM | POA: Diagnosis not present

## 2017-03-28 DIAGNOSIS — K219 Gastro-esophageal reflux disease without esophagitis: Secondary | ICD-10-CM | POA: Diagnosis not present

## 2017-03-28 DIAGNOSIS — R1310 Dysphagia, unspecified: Secondary | ICD-10-CM | POA: Diagnosis not present

## 2017-03-28 DIAGNOSIS — N189 Chronic kidney disease, unspecified: Secondary | ICD-10-CM | POA: Diagnosis not present

## 2017-03-29 DIAGNOSIS — R1310 Dysphagia, unspecified: Secondary | ICD-10-CM | POA: Diagnosis not present

## 2017-03-29 DIAGNOSIS — I4891 Unspecified atrial fibrillation: Secondary | ICD-10-CM | POA: Diagnosis not present

## 2017-03-29 DIAGNOSIS — M2450 Contracture, unspecified joint: Secondary | ICD-10-CM | POA: Diagnosis not present

## 2017-03-29 DIAGNOSIS — N189 Chronic kidney disease, unspecified: Secondary | ICD-10-CM | POA: Diagnosis not present

## 2017-03-29 DIAGNOSIS — I10 Essential (primary) hypertension: Secondary | ICD-10-CM | POA: Diagnosis not present

## 2017-03-29 DIAGNOSIS — K219 Gastro-esophageal reflux disease without esophagitis: Secondary | ICD-10-CM | POA: Diagnosis not present

## 2017-03-29 DIAGNOSIS — G20 Parkinson's disease: Secondary | ICD-10-CM | POA: Diagnosis not present

## 2017-03-30 DIAGNOSIS — G20 Parkinson's disease: Secondary | ICD-10-CM | POA: Diagnosis not present

## 2017-03-30 DIAGNOSIS — I10 Essential (primary) hypertension: Secondary | ICD-10-CM | POA: Diagnosis not present

## 2017-03-30 DIAGNOSIS — M2450 Contracture, unspecified joint: Secondary | ICD-10-CM | POA: Diagnosis not present

## 2017-03-30 DIAGNOSIS — N189 Chronic kidney disease, unspecified: Secondary | ICD-10-CM | POA: Diagnosis not present

## 2017-03-30 DIAGNOSIS — K219 Gastro-esophageal reflux disease without esophagitis: Secondary | ICD-10-CM | POA: Diagnosis not present

## 2017-03-30 DIAGNOSIS — I4891 Unspecified atrial fibrillation: Secondary | ICD-10-CM | POA: Diagnosis not present

## 2017-03-30 DIAGNOSIS — R1310 Dysphagia, unspecified: Secondary | ICD-10-CM | POA: Diagnosis not present

## 2017-03-31 DIAGNOSIS — I4891 Unspecified atrial fibrillation: Secondary | ICD-10-CM | POA: Diagnosis not present

## 2017-03-31 DIAGNOSIS — K219 Gastro-esophageal reflux disease without esophagitis: Secondary | ICD-10-CM | POA: Diagnosis not present

## 2017-03-31 DIAGNOSIS — R1310 Dysphagia, unspecified: Secondary | ICD-10-CM | POA: Diagnosis not present

## 2017-03-31 DIAGNOSIS — N189 Chronic kidney disease, unspecified: Secondary | ICD-10-CM | POA: Diagnosis not present

## 2017-03-31 DIAGNOSIS — I10 Essential (primary) hypertension: Secondary | ICD-10-CM | POA: Diagnosis not present

## 2017-03-31 DIAGNOSIS — M2450 Contracture, unspecified joint: Secondary | ICD-10-CM | POA: Diagnosis not present

## 2017-03-31 DIAGNOSIS — G20 Parkinson's disease: Secondary | ICD-10-CM | POA: Diagnosis not present

## 2017-04-01 DIAGNOSIS — I10 Essential (primary) hypertension: Secondary | ICD-10-CM | POA: Diagnosis not present

## 2017-04-01 DIAGNOSIS — I4891 Unspecified atrial fibrillation: Secondary | ICD-10-CM | POA: Diagnosis not present

## 2017-04-01 DIAGNOSIS — K219 Gastro-esophageal reflux disease without esophagitis: Secondary | ICD-10-CM | POA: Diagnosis not present

## 2017-04-01 DIAGNOSIS — G20 Parkinson's disease: Secondary | ICD-10-CM | POA: Diagnosis not present

## 2017-04-01 DIAGNOSIS — R1310 Dysphagia, unspecified: Secondary | ICD-10-CM | POA: Diagnosis not present

## 2017-04-01 DIAGNOSIS — N189 Chronic kidney disease, unspecified: Secondary | ICD-10-CM | POA: Diagnosis not present

## 2017-04-01 DIAGNOSIS — M2450 Contracture, unspecified joint: Secondary | ICD-10-CM | POA: Diagnosis not present

## 2017-04-02 DIAGNOSIS — K219 Gastro-esophageal reflux disease without esophagitis: Secondary | ICD-10-CM | POA: Diagnosis not present

## 2017-04-02 DIAGNOSIS — G20 Parkinson's disease: Secondary | ICD-10-CM | POA: Diagnosis not present

## 2017-04-02 DIAGNOSIS — N189 Chronic kidney disease, unspecified: Secondary | ICD-10-CM | POA: Diagnosis not present

## 2017-04-02 DIAGNOSIS — R1310 Dysphagia, unspecified: Secondary | ICD-10-CM | POA: Diagnosis not present

## 2017-04-02 DIAGNOSIS — I10 Essential (primary) hypertension: Secondary | ICD-10-CM | POA: Diagnosis not present

## 2017-04-02 DIAGNOSIS — I4891 Unspecified atrial fibrillation: Secondary | ICD-10-CM | POA: Diagnosis not present

## 2017-04-02 DIAGNOSIS — M2450 Contracture, unspecified joint: Secondary | ICD-10-CM | POA: Diagnosis not present

## 2017-04-03 DIAGNOSIS — R1310 Dysphagia, unspecified: Secondary | ICD-10-CM | POA: Diagnosis not present

## 2017-04-03 DIAGNOSIS — N189 Chronic kidney disease, unspecified: Secondary | ICD-10-CM | POA: Diagnosis not present

## 2017-04-03 DIAGNOSIS — I10 Essential (primary) hypertension: Secondary | ICD-10-CM | POA: Diagnosis not present

## 2017-04-03 DIAGNOSIS — K219 Gastro-esophageal reflux disease without esophagitis: Secondary | ICD-10-CM | POA: Diagnosis not present

## 2017-04-03 DIAGNOSIS — G20 Parkinson's disease: Secondary | ICD-10-CM | POA: Diagnosis not present

## 2017-04-03 DIAGNOSIS — M2450 Contracture, unspecified joint: Secondary | ICD-10-CM | POA: Diagnosis not present

## 2017-04-03 DIAGNOSIS — I4891 Unspecified atrial fibrillation: Secondary | ICD-10-CM | POA: Diagnosis not present

## 2017-04-04 DIAGNOSIS — M2450 Contracture, unspecified joint: Secondary | ICD-10-CM | POA: Diagnosis not present

## 2017-04-04 DIAGNOSIS — I10 Essential (primary) hypertension: Secondary | ICD-10-CM | POA: Diagnosis not present

## 2017-04-04 DIAGNOSIS — N189 Chronic kidney disease, unspecified: Secondary | ICD-10-CM | POA: Diagnosis not present

## 2017-04-04 DIAGNOSIS — R1310 Dysphagia, unspecified: Secondary | ICD-10-CM | POA: Diagnosis not present

## 2017-04-04 DIAGNOSIS — G20 Parkinson's disease: Secondary | ICD-10-CM | POA: Diagnosis not present

## 2017-04-04 DIAGNOSIS — K219 Gastro-esophageal reflux disease without esophagitis: Secondary | ICD-10-CM | POA: Diagnosis not present

## 2017-04-04 DIAGNOSIS — I4891 Unspecified atrial fibrillation: Secondary | ICD-10-CM | POA: Diagnosis not present

## 2017-04-05 ENCOUNTER — Encounter: Payer: Self-pay | Admitting: Adult Health

## 2017-04-05 ENCOUNTER — Non-Acute Institutional Stay (SKILLED_NURSING_FACILITY): Payer: Medicare Other | Admitting: Adult Health

## 2017-04-05 DIAGNOSIS — I482 Chronic atrial fibrillation, unspecified: Secondary | ICD-10-CM

## 2017-04-05 DIAGNOSIS — F0150 Vascular dementia without behavioral disturbance: Secondary | ICD-10-CM

## 2017-04-05 DIAGNOSIS — G20 Parkinson's disease: Secondary | ICD-10-CM | POA: Diagnosis not present

## 2017-04-05 DIAGNOSIS — F333 Major depressive disorder, recurrent, severe with psychotic symptoms: Secondary | ICD-10-CM | POA: Diagnosis not present

## 2017-04-05 DIAGNOSIS — R1310 Dysphagia, unspecified: Secondary | ICD-10-CM | POA: Diagnosis not present

## 2017-04-05 DIAGNOSIS — N189 Chronic kidney disease, unspecified: Secondary | ICD-10-CM | POA: Diagnosis not present

## 2017-04-05 DIAGNOSIS — I10 Essential (primary) hypertension: Secondary | ICD-10-CM | POA: Diagnosis not present

## 2017-04-05 DIAGNOSIS — I4891 Unspecified atrial fibrillation: Secondary | ICD-10-CM | POA: Diagnosis not present

## 2017-04-05 DIAGNOSIS — R634 Abnormal weight loss: Secondary | ICD-10-CM

## 2017-04-05 DIAGNOSIS — K219 Gastro-esophageal reflux disease without esophagitis: Secondary | ICD-10-CM | POA: Diagnosis not present

## 2017-04-05 DIAGNOSIS — M1712 Unilateral primary osteoarthritis, left knee: Secondary | ICD-10-CM

## 2017-04-05 DIAGNOSIS — F0153 Vascular dementia, unspecified severity, with mood disturbance: Secondary | ICD-10-CM

## 2017-04-05 DIAGNOSIS — F329 Major depressive disorder, single episode, unspecified: Secondary | ICD-10-CM

## 2017-04-05 DIAGNOSIS — M2450 Contracture, unspecified joint: Secondary | ICD-10-CM | POA: Diagnosis not present

## 2017-04-05 NOTE — Progress Notes (Signed)
Location:   [place] Room Number: [place] of Service:  SNF (31)   CODE STATUS: DNR  Allergies  Allergen Reactions  . Penicillins     Rash Has patient had a PCN reaction causing immediate rash, facial/tongue/throat swelling, SOB or lightheadedness with hypotension:YES Has patient had a PCN reaction causing severe rash involving mucus membranes or skin necrosis: NO Has patient had a PCN reaction that required hospitalization NO Has patient had a PCN reaction occurring within the last 10 years: NO If all of the above answers are "NO", then may proceed with Cephalosporin use.    Chief Complaint  Patient presents with  . Medical Management of Chronic Issues    1 month follow up    HPI:  She is a long term resident of this facility being seen for the management of her chronic illnesses. She continues to be followed by hospice care. She rarely gets out of bed. She is taking only comfort medications at this time. There are no nursing concerns at this time.    Past Medical History:  Diagnosis Date  . A-fib ([place])   . Anemia   . Anxiety   . Chronic kidney disease   . Chronic pain   . Constipation   . Depression   . Edema   . Gait disturbance   . Hypertension   . Hypokalemia   . Insomnia   . Intertrochanteric fracture ([place])   . Parkinson disease ([place])   . UTI (lower urinary tract infection)   . Vitamin D deficiency     Past Surgical History:  Procedure Laterality Date  . right hip surgery due to fracture      Social History   Social History  . Marital status: Married    Spouse name: N/A  . Number of children: N/A  . Years of education: N/A   Occupational History  . Not on file.   Social History Main Topics  . Smoking status: Never Smoker  . Smokeless tobacco: Never Used  . Alcohol use No  . Drug use: No  . Sexual activity: Not Currently   Other Topics Concern  . Not on file   Social History Narrative  . No narrative on file   Family  History  Problem Relation Age of Onset  . Cancer Mother   . Cancer Father       VITAL SIGNS BP 110/76   Pulse 67   Temp 98.7 F (37.1 C)   Resp 20   Ht 5' 2"  (1.575 m)   Wt 97 lb 9.6 oz (44.3 kg)   SpO2 97%   BMI 17.85 kg/m   Patient's Medications  New Prescriptions   No medications on file  Previous Medications   DICLOFENAC SODIUM (VOLTAREN) 1 % GEL    Apply topically 4 (four) times daily. Apply 4 gram transdermally four times a day to left knee   MELATONIN 3 MG TABS    Take by mouth. Take 2 tablets (6 mg) at bedtime  Modified Medications   No medications on file  Discontinued Medications   No medications on file     SIGNIFICANT DIAGNOSTIC EXAMS  03-26-15: left knee x-ray: modest osteoarthritis  03-26-15: left hip with pelvis x-ray; modest osteoarthritis left hip   LABS REVIEWED:   06-03-16: wbc 9.4; hgb 10.3; hct 34.3; mcv 87.3; plt 425; glucose 94; bun 16; creat 0.77; k+ 4.2; na++ 137 07-15-16: wbc 8.8 ;hgb 10.4; hct 32; plt 470; glucose 92; bun 12;  crea t0.7; k+ 4.6; na++ 137  08-31-16: wbc 8.8; hgb 10.7; hct 36.7; mcv 83.6; plt 466; glucose 130; bun 12.1; creat 0.70; k+ 4.5; na++ 139  02-03-17: wbc 9.1; hgb 9.9; hct 31.9; mcv 83.7; plt 423; glucose 91; bun 13.4; creat 0.58; k+ 4.2; na++ 138 alk phos 135; albumin 3.4      Review of Systems  Unable to perform ROS: other     Physical Exam Constitutional: No distress.  Thin   Neck: Neck supple. No JVD present. No thyromegaly present.  Breast exam negative  Cardiovascular: Normal rate and intact distal pulses.   Heart rate irregular   Respiratory: lungs clear; no respiratory distress  GI: Soft. She exhibits no distension.bowel sounds present  Musculoskeletal: She exhibits no edema.  Is able to move all extremities Has bilateral resting tremors present   Neurological: She is alert.  Skin: Skin is warm and dry. She is not diaphoretic.    ASSESSMENT/ PLAN:   1. Parkinson disease: no change in status she is  off medications will monitor   2. Afib: heart rate is stable is not on medications.   3. Hypertension: 110/76; will monitor   4. Major depression: she is off her medications; per her choice; will continue melatonin for sleep .   5. Left knee osteoarthritis: will continue  voltaren gel 4 gm to knees four times daily   6. Weight loss: her weight this month is 97 pounds; in Feb 2018 was 113 pounds. At this time will continue with supplements per facility protocol and will monitor   7. Dementia: she is not taking medications; her current weight is 97 pounds; unfortunately weight loss at the end stage can be expected.     Ok Edwards NP [facility]  Contact [phone] Monday through Friday 8am- 5pm  After hours call [phone]

## 2017-04-06 DIAGNOSIS — R1310 Dysphagia, unspecified: Secondary | ICD-10-CM | POA: Diagnosis not present

## 2017-04-06 DIAGNOSIS — G20 Parkinson's disease: Secondary | ICD-10-CM | POA: Diagnosis not present

## 2017-04-06 DIAGNOSIS — N189 Chronic kidney disease, unspecified: Secondary | ICD-10-CM | POA: Diagnosis not present

## 2017-04-06 DIAGNOSIS — I4891 Unspecified atrial fibrillation: Secondary | ICD-10-CM | POA: Diagnosis not present

## 2017-04-06 DIAGNOSIS — K219 Gastro-esophageal reflux disease without esophagitis: Secondary | ICD-10-CM | POA: Diagnosis not present

## 2017-04-06 DIAGNOSIS — I10 Essential (primary) hypertension: Secondary | ICD-10-CM | POA: Diagnosis not present

## 2017-04-06 DIAGNOSIS — M2450 Contracture, unspecified joint: Secondary | ICD-10-CM | POA: Diagnosis not present

## 2017-04-07 DIAGNOSIS — M2450 Contracture, unspecified joint: Secondary | ICD-10-CM | POA: Diagnosis not present

## 2017-04-07 DIAGNOSIS — N189 Chronic kidney disease, unspecified: Secondary | ICD-10-CM | POA: Diagnosis not present

## 2017-04-07 DIAGNOSIS — R1310 Dysphagia, unspecified: Secondary | ICD-10-CM | POA: Diagnosis not present

## 2017-04-07 DIAGNOSIS — G20 Parkinson's disease: Secondary | ICD-10-CM | POA: Diagnosis not present

## 2017-04-07 DIAGNOSIS — I4891 Unspecified atrial fibrillation: Secondary | ICD-10-CM | POA: Diagnosis not present

## 2017-04-07 DIAGNOSIS — I10 Essential (primary) hypertension: Secondary | ICD-10-CM | POA: Diagnosis not present

## 2017-04-07 DIAGNOSIS — K219 Gastro-esophageal reflux disease without esophagitis: Secondary | ICD-10-CM | POA: Diagnosis not present

## 2017-04-08 DIAGNOSIS — K219 Gastro-esophageal reflux disease without esophagitis: Secondary | ICD-10-CM | POA: Diagnosis not present

## 2017-04-08 DIAGNOSIS — I10 Essential (primary) hypertension: Secondary | ICD-10-CM | POA: Diagnosis not present

## 2017-04-08 DIAGNOSIS — N189 Chronic kidney disease, unspecified: Secondary | ICD-10-CM | POA: Diagnosis not present

## 2017-04-08 DIAGNOSIS — R1310 Dysphagia, unspecified: Secondary | ICD-10-CM | POA: Diagnosis not present

## 2017-04-08 DIAGNOSIS — G20 Parkinson's disease: Secondary | ICD-10-CM | POA: Diagnosis not present

## 2017-04-08 DIAGNOSIS — M2450 Contracture, unspecified joint: Secondary | ICD-10-CM | POA: Diagnosis not present

## 2017-04-08 DIAGNOSIS — I4891 Unspecified atrial fibrillation: Secondary | ICD-10-CM | POA: Diagnosis not present

## 2017-04-09 DIAGNOSIS — I4891 Unspecified atrial fibrillation: Secondary | ICD-10-CM | POA: Diagnosis not present

## 2017-04-09 DIAGNOSIS — N189 Chronic kidney disease, unspecified: Secondary | ICD-10-CM | POA: Diagnosis not present

## 2017-04-09 DIAGNOSIS — G20 Parkinson's disease: Secondary | ICD-10-CM | POA: Diagnosis not present

## 2017-04-09 DIAGNOSIS — M2450 Contracture, unspecified joint: Secondary | ICD-10-CM | POA: Diagnosis not present

## 2017-04-09 DIAGNOSIS — R1310 Dysphagia, unspecified: Secondary | ICD-10-CM | POA: Diagnosis not present

## 2017-04-09 DIAGNOSIS — I10 Essential (primary) hypertension: Secondary | ICD-10-CM | POA: Diagnosis not present

## 2017-04-09 DIAGNOSIS — K219 Gastro-esophageal reflux disease without esophagitis: Secondary | ICD-10-CM | POA: Diagnosis not present

## 2017-04-10 DIAGNOSIS — R1310 Dysphagia, unspecified: Secondary | ICD-10-CM | POA: Diagnosis not present

## 2017-04-10 DIAGNOSIS — M2450 Contracture, unspecified joint: Secondary | ICD-10-CM | POA: Diagnosis not present

## 2017-04-10 DIAGNOSIS — K219 Gastro-esophageal reflux disease without esophagitis: Secondary | ICD-10-CM | POA: Diagnosis not present

## 2017-04-10 DIAGNOSIS — G20 Parkinson's disease: Secondary | ICD-10-CM | POA: Diagnosis not present

## 2017-04-10 DIAGNOSIS — I10 Essential (primary) hypertension: Secondary | ICD-10-CM | POA: Diagnosis not present

## 2017-04-10 DIAGNOSIS — I4891 Unspecified atrial fibrillation: Secondary | ICD-10-CM | POA: Diagnosis not present

## 2017-04-10 DIAGNOSIS — N189 Chronic kidney disease, unspecified: Secondary | ICD-10-CM | POA: Diagnosis not present

## 2017-04-11 DIAGNOSIS — R1310 Dysphagia, unspecified: Secondary | ICD-10-CM | POA: Diagnosis not present

## 2017-04-11 DIAGNOSIS — I4891 Unspecified atrial fibrillation: Secondary | ICD-10-CM | POA: Diagnosis not present

## 2017-04-11 DIAGNOSIS — G20 Parkinson's disease: Secondary | ICD-10-CM | POA: Diagnosis not present

## 2017-04-11 DIAGNOSIS — K219 Gastro-esophageal reflux disease without esophagitis: Secondary | ICD-10-CM | POA: Diagnosis not present

## 2017-04-11 DIAGNOSIS — I10 Essential (primary) hypertension: Secondary | ICD-10-CM | POA: Diagnosis not present

## 2017-04-11 DIAGNOSIS — N189 Chronic kidney disease, unspecified: Secondary | ICD-10-CM | POA: Diagnosis not present

## 2017-04-11 DIAGNOSIS — M2450 Contracture, unspecified joint: Secondary | ICD-10-CM | POA: Diagnosis not present

## 2017-04-12 DIAGNOSIS — R1310 Dysphagia, unspecified: Secondary | ICD-10-CM | POA: Diagnosis not present

## 2017-04-12 DIAGNOSIS — G20 Parkinson's disease: Secondary | ICD-10-CM | POA: Diagnosis not present

## 2017-04-12 DIAGNOSIS — I4891 Unspecified atrial fibrillation: Secondary | ICD-10-CM | POA: Diagnosis not present

## 2017-04-12 DIAGNOSIS — K219 Gastro-esophageal reflux disease without esophagitis: Secondary | ICD-10-CM | POA: Diagnosis not present

## 2017-04-12 DIAGNOSIS — I10 Essential (primary) hypertension: Secondary | ICD-10-CM | POA: Diagnosis not present

## 2017-04-12 DIAGNOSIS — N189 Chronic kidney disease, unspecified: Secondary | ICD-10-CM | POA: Diagnosis not present

## 2017-04-12 DIAGNOSIS — M2450 Contracture, unspecified joint: Secondary | ICD-10-CM | POA: Diagnosis not present

## 2017-04-13 DIAGNOSIS — K219 Gastro-esophageal reflux disease without esophagitis: Secondary | ICD-10-CM | POA: Diagnosis not present

## 2017-04-13 DIAGNOSIS — I10 Essential (primary) hypertension: Secondary | ICD-10-CM | POA: Diagnosis not present

## 2017-04-13 DIAGNOSIS — I4891 Unspecified atrial fibrillation: Secondary | ICD-10-CM | POA: Diagnosis not present

## 2017-04-13 DIAGNOSIS — M2450 Contracture, unspecified joint: Secondary | ICD-10-CM | POA: Diagnosis not present

## 2017-04-13 DIAGNOSIS — G20 Parkinson's disease: Secondary | ICD-10-CM | POA: Diagnosis not present

## 2017-04-13 DIAGNOSIS — R1310 Dysphagia, unspecified: Secondary | ICD-10-CM | POA: Diagnosis not present

## 2017-04-13 DIAGNOSIS — N189 Chronic kidney disease, unspecified: Secondary | ICD-10-CM | POA: Diagnosis not present

## 2017-04-14 DIAGNOSIS — G20 Parkinson's disease: Secondary | ICD-10-CM | POA: Diagnosis not present

## 2017-04-14 DIAGNOSIS — K219 Gastro-esophageal reflux disease without esophagitis: Secondary | ICD-10-CM | POA: Diagnosis not present

## 2017-04-14 DIAGNOSIS — I4891 Unspecified atrial fibrillation: Secondary | ICD-10-CM | POA: Diagnosis not present

## 2017-04-14 DIAGNOSIS — M2450 Contracture, unspecified joint: Secondary | ICD-10-CM | POA: Diagnosis not present

## 2017-04-14 DIAGNOSIS — N189 Chronic kidney disease, unspecified: Secondary | ICD-10-CM | POA: Diagnosis not present

## 2017-04-14 DIAGNOSIS — R1310 Dysphagia, unspecified: Secondary | ICD-10-CM | POA: Diagnosis not present

## 2017-04-14 DIAGNOSIS — I10 Essential (primary) hypertension: Secondary | ICD-10-CM | POA: Diagnosis not present

## 2017-04-15 DIAGNOSIS — N189 Chronic kidney disease, unspecified: Secondary | ICD-10-CM | POA: Diagnosis not present

## 2017-04-15 DIAGNOSIS — I10 Essential (primary) hypertension: Secondary | ICD-10-CM | POA: Diagnosis not present

## 2017-04-15 DIAGNOSIS — K219 Gastro-esophageal reflux disease without esophagitis: Secondary | ICD-10-CM | POA: Diagnosis not present

## 2017-04-15 DIAGNOSIS — G20 Parkinson's disease: Secondary | ICD-10-CM | POA: Diagnosis not present

## 2017-04-15 DIAGNOSIS — M2450 Contracture, unspecified joint: Secondary | ICD-10-CM | POA: Diagnosis not present

## 2017-04-15 DIAGNOSIS — I4891 Unspecified atrial fibrillation: Secondary | ICD-10-CM | POA: Diagnosis not present

## 2017-04-15 DIAGNOSIS — R1310 Dysphagia, unspecified: Secondary | ICD-10-CM | POA: Diagnosis not present

## 2017-04-16 DIAGNOSIS — M2450 Contracture, unspecified joint: Secondary | ICD-10-CM | POA: Diagnosis not present

## 2017-04-16 DIAGNOSIS — I10 Essential (primary) hypertension: Secondary | ICD-10-CM | POA: Diagnosis not present

## 2017-04-16 DIAGNOSIS — N189 Chronic kidney disease, unspecified: Secondary | ICD-10-CM | POA: Diagnosis not present

## 2017-04-16 DIAGNOSIS — K219 Gastro-esophageal reflux disease without esophagitis: Secondary | ICD-10-CM | POA: Diagnosis not present

## 2017-04-16 DIAGNOSIS — R1310 Dysphagia, unspecified: Secondary | ICD-10-CM | POA: Diagnosis not present

## 2017-04-16 DIAGNOSIS — G20 Parkinson's disease: Secondary | ICD-10-CM | POA: Diagnosis not present

## 2017-04-16 DIAGNOSIS — I4891 Unspecified atrial fibrillation: Secondary | ICD-10-CM | POA: Diagnosis not present

## 2017-04-17 DIAGNOSIS — R1310 Dysphagia, unspecified: Secondary | ICD-10-CM | POA: Diagnosis not present

## 2017-04-17 DIAGNOSIS — I4891 Unspecified atrial fibrillation: Secondary | ICD-10-CM | POA: Diagnosis not present

## 2017-04-17 DIAGNOSIS — M2450 Contracture, unspecified joint: Secondary | ICD-10-CM | POA: Diagnosis not present

## 2017-04-17 DIAGNOSIS — G20 Parkinson's disease: Secondary | ICD-10-CM | POA: Diagnosis not present

## 2017-04-17 DIAGNOSIS — I10 Essential (primary) hypertension: Secondary | ICD-10-CM | POA: Diagnosis not present

## 2017-04-17 DIAGNOSIS — N189 Chronic kidney disease, unspecified: Secondary | ICD-10-CM | POA: Diagnosis not present

## 2017-04-17 DIAGNOSIS — K219 Gastro-esophageal reflux disease without esophagitis: Secondary | ICD-10-CM | POA: Diagnosis not present

## 2017-04-18 DIAGNOSIS — I10 Essential (primary) hypertension: Secondary | ICD-10-CM | POA: Diagnosis not present

## 2017-04-18 DIAGNOSIS — N189 Chronic kidney disease, unspecified: Secondary | ICD-10-CM | POA: Diagnosis not present

## 2017-04-18 DIAGNOSIS — R1310 Dysphagia, unspecified: Secondary | ICD-10-CM | POA: Diagnosis not present

## 2017-04-18 DIAGNOSIS — K219 Gastro-esophageal reflux disease without esophagitis: Secondary | ICD-10-CM | POA: Diagnosis not present

## 2017-04-18 DIAGNOSIS — G20 Parkinson's disease: Secondary | ICD-10-CM | POA: Diagnosis not present

## 2017-04-18 DIAGNOSIS — M2450 Contracture, unspecified joint: Secondary | ICD-10-CM | POA: Diagnosis not present

## 2017-04-18 DIAGNOSIS — I4891 Unspecified atrial fibrillation: Secondary | ICD-10-CM | POA: Diagnosis not present

## 2017-04-19 DIAGNOSIS — R1310 Dysphagia, unspecified: Secondary | ICD-10-CM | POA: Diagnosis not present

## 2017-04-19 DIAGNOSIS — N189 Chronic kidney disease, unspecified: Secondary | ICD-10-CM | POA: Diagnosis not present

## 2017-04-19 DIAGNOSIS — I4891 Unspecified atrial fibrillation: Secondary | ICD-10-CM | POA: Diagnosis not present

## 2017-04-19 DIAGNOSIS — M2450 Contracture, unspecified joint: Secondary | ICD-10-CM | POA: Diagnosis not present

## 2017-04-19 DIAGNOSIS — I10 Essential (primary) hypertension: Secondary | ICD-10-CM | POA: Diagnosis not present

## 2017-04-19 DIAGNOSIS — K219 Gastro-esophageal reflux disease without esophagitis: Secondary | ICD-10-CM | POA: Diagnosis not present

## 2017-04-19 DIAGNOSIS — G20 Parkinson's disease: Secondary | ICD-10-CM | POA: Diagnosis not present

## 2017-04-20 DIAGNOSIS — R1310 Dysphagia, unspecified: Secondary | ICD-10-CM | POA: Diagnosis not present

## 2017-04-20 DIAGNOSIS — M2450 Contracture, unspecified joint: Secondary | ICD-10-CM | POA: Diagnosis not present

## 2017-04-20 DIAGNOSIS — I4891 Unspecified atrial fibrillation: Secondary | ICD-10-CM | POA: Diagnosis not present

## 2017-04-20 DIAGNOSIS — N189 Chronic kidney disease, unspecified: Secondary | ICD-10-CM | POA: Diagnosis not present

## 2017-04-20 DIAGNOSIS — I10 Essential (primary) hypertension: Secondary | ICD-10-CM | POA: Diagnosis not present

## 2017-04-20 DIAGNOSIS — K219 Gastro-esophageal reflux disease without esophagitis: Secondary | ICD-10-CM | POA: Diagnosis not present

## 2017-04-20 DIAGNOSIS — G20 Parkinson's disease: Secondary | ICD-10-CM | POA: Diagnosis not present

## 2017-04-21 DIAGNOSIS — N189 Chronic kidney disease, unspecified: Secondary | ICD-10-CM | POA: Diagnosis not present

## 2017-04-21 DIAGNOSIS — M2450 Contracture, unspecified joint: Secondary | ICD-10-CM | POA: Diagnosis not present

## 2017-04-21 DIAGNOSIS — I10 Essential (primary) hypertension: Secondary | ICD-10-CM | POA: Diagnosis not present

## 2017-04-21 DIAGNOSIS — K219 Gastro-esophageal reflux disease without esophagitis: Secondary | ICD-10-CM | POA: Diagnosis not present

## 2017-04-21 DIAGNOSIS — I4891 Unspecified atrial fibrillation: Secondary | ICD-10-CM | POA: Diagnosis not present

## 2017-04-21 DIAGNOSIS — G20 Parkinson's disease: Secondary | ICD-10-CM | POA: Diagnosis not present

## 2017-04-21 DIAGNOSIS — R1310 Dysphagia, unspecified: Secondary | ICD-10-CM | POA: Diagnosis not present

## 2017-04-22 DIAGNOSIS — G20 Parkinson's disease: Secondary | ICD-10-CM | POA: Diagnosis not present

## 2017-04-22 DIAGNOSIS — R1310 Dysphagia, unspecified: Secondary | ICD-10-CM | POA: Diagnosis not present

## 2017-04-22 DIAGNOSIS — I10 Essential (primary) hypertension: Secondary | ICD-10-CM | POA: Diagnosis not present

## 2017-04-22 DIAGNOSIS — K219 Gastro-esophageal reflux disease without esophagitis: Secondary | ICD-10-CM | POA: Diagnosis not present

## 2017-04-22 DIAGNOSIS — I4891 Unspecified atrial fibrillation: Secondary | ICD-10-CM | POA: Diagnosis not present

## 2017-04-22 DIAGNOSIS — M2450 Contracture, unspecified joint: Secondary | ICD-10-CM | POA: Diagnosis not present

## 2017-04-22 DIAGNOSIS — N189 Chronic kidney disease, unspecified: Secondary | ICD-10-CM | POA: Diagnosis not present

## 2017-04-23 DIAGNOSIS — M2450 Contracture, unspecified joint: Secondary | ICD-10-CM | POA: Diagnosis not present

## 2017-04-23 DIAGNOSIS — G20 Parkinson's disease: Secondary | ICD-10-CM | POA: Diagnosis not present

## 2017-04-23 DIAGNOSIS — N189 Chronic kidney disease, unspecified: Secondary | ICD-10-CM | POA: Diagnosis not present

## 2017-04-23 DIAGNOSIS — I4891 Unspecified atrial fibrillation: Secondary | ICD-10-CM | POA: Diagnosis not present

## 2017-04-23 DIAGNOSIS — K219 Gastro-esophageal reflux disease without esophagitis: Secondary | ICD-10-CM | POA: Diagnosis not present

## 2017-04-23 DIAGNOSIS — R1310 Dysphagia, unspecified: Secondary | ICD-10-CM | POA: Diagnosis not present

## 2017-04-23 DIAGNOSIS — I10 Essential (primary) hypertension: Secondary | ICD-10-CM | POA: Diagnosis not present

## 2017-04-24 DIAGNOSIS — I4891 Unspecified atrial fibrillation: Secondary | ICD-10-CM | POA: Diagnosis not present

## 2017-04-24 DIAGNOSIS — I10 Essential (primary) hypertension: Secondary | ICD-10-CM | POA: Diagnosis not present

## 2017-04-24 DIAGNOSIS — G20 Parkinson's disease: Secondary | ICD-10-CM | POA: Diagnosis not present

## 2017-04-24 DIAGNOSIS — M2450 Contracture, unspecified joint: Secondary | ICD-10-CM | POA: Diagnosis not present

## 2017-04-24 DIAGNOSIS — N189 Chronic kidney disease, unspecified: Secondary | ICD-10-CM | POA: Diagnosis not present

## 2017-04-24 DIAGNOSIS — R1310 Dysphagia, unspecified: Secondary | ICD-10-CM | POA: Diagnosis not present

## 2017-04-24 DIAGNOSIS — K219 Gastro-esophageal reflux disease without esophagitis: Secondary | ICD-10-CM | POA: Diagnosis not present

## 2017-04-25 DIAGNOSIS — K219 Gastro-esophageal reflux disease without esophagitis: Secondary | ICD-10-CM | POA: Diagnosis not present

## 2017-04-25 DIAGNOSIS — R1310 Dysphagia, unspecified: Secondary | ICD-10-CM | POA: Diagnosis not present

## 2017-04-25 DIAGNOSIS — N189 Chronic kidney disease, unspecified: Secondary | ICD-10-CM | POA: Diagnosis not present

## 2017-04-25 DIAGNOSIS — G20 Parkinson's disease: Secondary | ICD-10-CM | POA: Diagnosis not present

## 2017-04-25 DIAGNOSIS — I4891 Unspecified atrial fibrillation: Secondary | ICD-10-CM | POA: Diagnosis not present

## 2017-04-25 DIAGNOSIS — M2450 Contracture, unspecified joint: Secondary | ICD-10-CM | POA: Diagnosis not present

## 2017-04-25 DIAGNOSIS — I10 Essential (primary) hypertension: Secondary | ICD-10-CM | POA: Diagnosis not present

## 2017-05-11 ENCOUNTER — Encounter: Payer: Self-pay | Admitting: Adult Health

## 2017-05-11 ENCOUNTER — Non-Acute Institutional Stay (SKILLED_NURSING_FACILITY): Payer: Medicare Other | Admitting: Adult Health

## 2017-05-11 DIAGNOSIS — F0150 Vascular dementia without behavioral disturbance: Secondary | ICD-10-CM | POA: Diagnosis not present

## 2017-05-11 DIAGNOSIS — F329 Major depressive disorder, single episode, unspecified: Secondary | ICD-10-CM | POA: Diagnosis not present

## 2017-05-11 DIAGNOSIS — M1712 Unilateral primary osteoarthritis, left knee: Secondary | ICD-10-CM

## 2017-05-11 DIAGNOSIS — G20 Parkinson's disease: Secondary | ICD-10-CM

## 2017-05-11 DIAGNOSIS — I482 Chronic atrial fibrillation, unspecified: Secondary | ICD-10-CM

## 2017-05-11 DIAGNOSIS — F333 Major depressive disorder, recurrent, severe with psychotic symptoms: Secondary | ICD-10-CM

## 2017-05-11 DIAGNOSIS — I10 Essential (primary) hypertension: Secondary | ICD-10-CM | POA: Diagnosis not present

## 2017-05-11 DIAGNOSIS — F0153 Vascular dementia, unspecified severity, with mood disturbance: Secondary | ICD-10-CM

## 2017-05-11 NOTE — Progress Notes (Signed)
Location:   [place] Room Number: [place] of Service:  SNF (31)   CODE STATUS: DNR  Allergies  Allergen Reactions  . Penicillins     Rash Has patient had a PCN reaction causing immediate rash, facial/tongue/throat swelling, SOB or lightheadedness with hypotension:YES Has patient had a PCN reaction causing severe rash involving mucus membranes or skin necrosis: NO Has patient had a PCN reaction that required hospitalization NO Has patient had a PCN reaction occurring within the last 10 years: NO If all of the above answers are "NO", then may proceed with Cephalosporin use.    Chief Complaint  Patient presents with  . Medical Management of Chronic Issues    1 month follow up    HPI:  She is a long term resident of this facility being seen for the management of her chronic illnesses. She continues to be followed by hospice care. She tells me that she feels good and has no complaints. She does spend all of her time in bed per her choice. There are no nursing concerns at this time.   Past Medical History:  Diagnosis Date  . A-fib ([place])   . Anemia   . Anxiety   . Chronic kidney disease   . Chronic pain   . Constipation   . Depression   . Edema   . Gait disturbance   . Hypertension   . Hypokalemia   . Insomnia   . Intertrochanteric fracture ([place])   . Parkinson disease ([place])   . UTI (lower urinary tract infection)   . Vitamin D deficiency     Past Surgical History:  Procedure Laterality Date  . right hip surgery due to fracture      Social History   Social History  . Marital status: Married    Spouse name: N/A  . Number of children: N/A  . Years of education: N/A   Occupational History  . Not on file.   Social History Main Topics  . Smoking status: Never Smoker  . Smokeless tobacco: Never Used  . Alcohol use No  . Drug use: No  . Sexual activity: Not Currently   Other Topics Concern  . Not on file   Social History Narrative  . No  narrative on file   Family History  Problem Relation Age of Onset  . Cancer Mother   . Cancer Father       VITAL SIGNS BP 124/66   Pulse 68   Temp 97 F (36.1 C)   Resp 16   Ht 5' 2"  (1.575 m)   Wt 97 lb 10 oz (44.3 kg)   SpO2 97%   BMI 17.86 kg/m   Patient's Medications  New Prescriptions   No medications on file  Previous Medications   DICLOFENAC SODIUM (VOLTAREN) 1 % GEL    Apply topically 4 (four) times daily. Apply 4 gram transdermally four times a day to left knee   MELATONIN 3 MG TABS    Take by mouth. Take 2 tablets (6 mg) at bedtime  Modified Medications   No medications on file  Discontinued Medications   No medications on file     SIGNIFICANT DIAGNOSTIC EXAMS  03-26-15: left knee x-ray: modest osteoarthritis  03-26-15: left hip with pelvis x-ray; modest osteoarthritis left hip   LABS REVIEWED:   06-03-16: wbc 9.4; hgb 10.3; hct 34.3; mcv 87.3; plt 425; glucose 94; bun 16; creat 0.77; k+ 4.2; na++ 137 07-15-16: wbc 8.8 ;hgb 10.4; hct  32; plt 470; glucose 92; bun 12; crea t0.7; k+ 4.6; na++ 137  08-31-16: wbc 8.8; hgb 10.7; hct 36.7; mcv 83.6; plt 466; glucose 130; bun 12.1; creat 0.70; k+ 4.5; na++ 139  02-03-17: wbc 9.1; hgb 9.9; hct 31.9; mcv 83.7; plt 423; glucose 91; bun 13.4; creat 0.58; k+ 4.2; na++ 138 alk phos 135; albumin 3.4      Review of Systems  Unable to perform ROS: other     Physical Exam Constitutional: No distress.  Thin   Neck: Neck supple. No JVD present. No thyromegaly present.  Breast exam negative  Cardiovascular: Normal rate and intact distal pulses.   Heart rate irregular   Respiratory: lungs clear; no respiratory distress  GI: Soft. She exhibits no distension.bowel sounds present  Musculoskeletal: She exhibits no edema.  Is able to move all extremities Has bilateral resting tremors present   Neurological: She is alert.  Skin: Skin is warm and dry. She is not diaphoretic.    ASSESSMENT/ PLAN:   1. Parkinson disease:  no change in status she is off medications will monitor   2. Afib: heart rate is stable is not on medications.   3. Hypertension: 124/66; will monitor   4. Major depression: she is off her medications; per her choice; will continue melatonin for sleep .   5. Left knee osteoarthritis: will continue  voltaren gel 4 gm to knees four times daily   6. Weight loss: her weight this month is 97 pounds; in Feb 2018 was 113 pounds. At this time will continue with supplements per facility protocol and will monitor   7. Dementia: she is not taking medications; her current weight is 97 pounds; unfortunately weight loss at the end stage can be expected.     Ok Edwards NP [facility]  Contact [phone] Monday through Friday 8am- 5pm  After hours call [phone]

## 2017-05-27 DIAGNOSIS — I4891 Unspecified atrial fibrillation: Secondary | ICD-10-CM | POA: Diagnosis not present

## 2017-05-27 DIAGNOSIS — K219 Gastro-esophageal reflux disease without esophagitis: Secondary | ICD-10-CM | POA: Diagnosis not present

## 2017-05-27 DIAGNOSIS — I10 Essential (primary) hypertension: Secondary | ICD-10-CM | POA: Diagnosis not present

## 2017-05-27 DIAGNOSIS — G20 Parkinson's disease: Secondary | ICD-10-CM | POA: Diagnosis not present

## 2017-05-27 DIAGNOSIS — M2450 Contracture, unspecified joint: Secondary | ICD-10-CM | POA: Diagnosis not present

## 2017-05-27 DIAGNOSIS — R1310 Dysphagia, unspecified: Secondary | ICD-10-CM | POA: Diagnosis not present

## 2017-05-27 DIAGNOSIS — N189 Chronic kidney disease, unspecified: Secondary | ICD-10-CM | POA: Diagnosis not present

## 2017-05-28 DIAGNOSIS — K219 Gastro-esophageal reflux disease without esophagitis: Secondary | ICD-10-CM | POA: Diagnosis not present

## 2017-05-28 DIAGNOSIS — G20 Parkinson's disease: Secondary | ICD-10-CM | POA: Diagnosis not present

## 2017-05-28 DIAGNOSIS — M2450 Contracture, unspecified joint: Secondary | ICD-10-CM | POA: Diagnosis not present

## 2017-05-28 DIAGNOSIS — R1310 Dysphagia, unspecified: Secondary | ICD-10-CM | POA: Diagnosis not present

## 2017-05-28 DIAGNOSIS — I4891 Unspecified atrial fibrillation: Secondary | ICD-10-CM | POA: Diagnosis not present

## 2017-05-28 DIAGNOSIS — I10 Essential (primary) hypertension: Secondary | ICD-10-CM | POA: Diagnosis not present

## 2017-05-28 DIAGNOSIS — N189 Chronic kidney disease, unspecified: Secondary | ICD-10-CM | POA: Diagnosis not present

## 2017-05-29 DIAGNOSIS — N189 Chronic kidney disease, unspecified: Secondary | ICD-10-CM | POA: Diagnosis not present

## 2017-05-29 DIAGNOSIS — G20 Parkinson's disease: Secondary | ICD-10-CM | POA: Diagnosis not present

## 2017-05-29 DIAGNOSIS — M2450 Contracture, unspecified joint: Secondary | ICD-10-CM | POA: Diagnosis not present

## 2017-05-29 DIAGNOSIS — K219 Gastro-esophageal reflux disease without esophagitis: Secondary | ICD-10-CM | POA: Diagnosis not present

## 2017-05-29 DIAGNOSIS — R1310 Dysphagia, unspecified: Secondary | ICD-10-CM | POA: Diagnosis not present

## 2017-05-29 DIAGNOSIS — I4891 Unspecified atrial fibrillation: Secondary | ICD-10-CM | POA: Diagnosis not present

## 2017-05-29 DIAGNOSIS — I10 Essential (primary) hypertension: Secondary | ICD-10-CM | POA: Diagnosis not present

## 2017-05-30 DIAGNOSIS — I4891 Unspecified atrial fibrillation: Secondary | ICD-10-CM | POA: Diagnosis not present

## 2017-05-30 DIAGNOSIS — R1310 Dysphagia, unspecified: Secondary | ICD-10-CM | POA: Diagnosis not present

## 2017-05-30 DIAGNOSIS — M2450 Contracture, unspecified joint: Secondary | ICD-10-CM | POA: Diagnosis not present

## 2017-05-30 DIAGNOSIS — K219 Gastro-esophageal reflux disease without esophagitis: Secondary | ICD-10-CM | POA: Diagnosis not present

## 2017-05-30 DIAGNOSIS — I10 Essential (primary) hypertension: Secondary | ICD-10-CM | POA: Diagnosis not present

## 2017-05-30 DIAGNOSIS — N189 Chronic kidney disease, unspecified: Secondary | ICD-10-CM | POA: Diagnosis not present

## 2017-05-30 DIAGNOSIS — G20 Parkinson's disease: Secondary | ICD-10-CM | POA: Diagnosis not present

## 2017-05-31 DIAGNOSIS — I10 Essential (primary) hypertension: Secondary | ICD-10-CM | POA: Diagnosis not present

## 2017-05-31 DIAGNOSIS — K219 Gastro-esophageal reflux disease without esophagitis: Secondary | ICD-10-CM | POA: Diagnosis not present

## 2017-05-31 DIAGNOSIS — N189 Chronic kidney disease, unspecified: Secondary | ICD-10-CM | POA: Diagnosis not present

## 2017-05-31 DIAGNOSIS — R1310 Dysphagia, unspecified: Secondary | ICD-10-CM | POA: Diagnosis not present

## 2017-05-31 DIAGNOSIS — G20 Parkinson's disease: Secondary | ICD-10-CM | POA: Diagnosis not present

## 2017-05-31 DIAGNOSIS — I4891 Unspecified atrial fibrillation: Secondary | ICD-10-CM | POA: Diagnosis not present

## 2017-05-31 DIAGNOSIS — M2450 Contracture, unspecified joint: Secondary | ICD-10-CM | POA: Diagnosis not present

## 2017-06-01 DIAGNOSIS — G20 Parkinson's disease: Secondary | ICD-10-CM | POA: Diagnosis not present

## 2017-06-01 DIAGNOSIS — M2450 Contracture, unspecified joint: Secondary | ICD-10-CM | POA: Diagnosis not present

## 2017-06-01 DIAGNOSIS — R1310 Dysphagia, unspecified: Secondary | ICD-10-CM | POA: Diagnosis not present

## 2017-06-01 DIAGNOSIS — I10 Essential (primary) hypertension: Secondary | ICD-10-CM | POA: Diagnosis not present

## 2017-06-01 DIAGNOSIS — N189 Chronic kidney disease, unspecified: Secondary | ICD-10-CM | POA: Diagnosis not present

## 2017-06-01 DIAGNOSIS — K219 Gastro-esophageal reflux disease without esophagitis: Secondary | ICD-10-CM | POA: Diagnosis not present

## 2017-06-01 DIAGNOSIS — I4891 Unspecified atrial fibrillation: Secondary | ICD-10-CM | POA: Diagnosis not present

## 2017-06-02 DIAGNOSIS — I4891 Unspecified atrial fibrillation: Secondary | ICD-10-CM | POA: Diagnosis not present

## 2017-06-02 DIAGNOSIS — M2450 Contracture, unspecified joint: Secondary | ICD-10-CM | POA: Diagnosis not present

## 2017-06-02 DIAGNOSIS — I10 Essential (primary) hypertension: Secondary | ICD-10-CM | POA: Diagnosis not present

## 2017-06-02 DIAGNOSIS — N189 Chronic kidney disease, unspecified: Secondary | ICD-10-CM | POA: Diagnosis not present

## 2017-06-02 DIAGNOSIS — R1310 Dysphagia, unspecified: Secondary | ICD-10-CM | POA: Diagnosis not present

## 2017-06-02 DIAGNOSIS — K219 Gastro-esophageal reflux disease without esophagitis: Secondary | ICD-10-CM | POA: Diagnosis not present

## 2017-06-02 DIAGNOSIS — G20 Parkinson's disease: Secondary | ICD-10-CM | POA: Diagnosis not present

## 2017-06-03 DIAGNOSIS — I10 Essential (primary) hypertension: Secondary | ICD-10-CM | POA: Diagnosis not present

## 2017-06-03 DIAGNOSIS — N189 Chronic kidney disease, unspecified: Secondary | ICD-10-CM | POA: Diagnosis not present

## 2017-06-03 DIAGNOSIS — R1310 Dysphagia, unspecified: Secondary | ICD-10-CM | POA: Diagnosis not present

## 2017-06-03 DIAGNOSIS — G20 Parkinson's disease: Secondary | ICD-10-CM | POA: Diagnosis not present

## 2017-06-03 DIAGNOSIS — M2450 Contracture, unspecified joint: Secondary | ICD-10-CM | POA: Diagnosis not present

## 2017-06-03 DIAGNOSIS — I4891 Unspecified atrial fibrillation: Secondary | ICD-10-CM | POA: Diagnosis not present

## 2017-06-03 DIAGNOSIS — K219 Gastro-esophageal reflux disease without esophagitis: Secondary | ICD-10-CM | POA: Diagnosis not present

## 2017-06-04 DIAGNOSIS — I10 Essential (primary) hypertension: Secondary | ICD-10-CM | POA: Diagnosis not present

## 2017-06-04 DIAGNOSIS — I4891 Unspecified atrial fibrillation: Secondary | ICD-10-CM | POA: Diagnosis not present

## 2017-06-04 DIAGNOSIS — R1310 Dysphagia, unspecified: Secondary | ICD-10-CM | POA: Diagnosis not present

## 2017-06-04 DIAGNOSIS — G20 Parkinson's disease: Secondary | ICD-10-CM | POA: Diagnosis not present

## 2017-06-04 DIAGNOSIS — M2450 Contracture, unspecified joint: Secondary | ICD-10-CM | POA: Diagnosis not present

## 2017-06-04 DIAGNOSIS — K219 Gastro-esophageal reflux disease without esophagitis: Secondary | ICD-10-CM | POA: Diagnosis not present

## 2017-06-04 DIAGNOSIS — N189 Chronic kidney disease, unspecified: Secondary | ICD-10-CM | POA: Diagnosis not present

## 2017-06-05 DIAGNOSIS — I10 Essential (primary) hypertension: Secondary | ICD-10-CM | POA: Diagnosis not present

## 2017-06-05 DIAGNOSIS — G20 Parkinson's disease: Secondary | ICD-10-CM | POA: Diagnosis not present

## 2017-06-05 DIAGNOSIS — N189 Chronic kidney disease, unspecified: Secondary | ICD-10-CM | POA: Diagnosis not present

## 2017-06-05 DIAGNOSIS — K219 Gastro-esophageal reflux disease without esophagitis: Secondary | ICD-10-CM | POA: Diagnosis not present

## 2017-06-05 DIAGNOSIS — M2450 Contracture, unspecified joint: Secondary | ICD-10-CM | POA: Diagnosis not present

## 2017-06-05 DIAGNOSIS — I4891 Unspecified atrial fibrillation: Secondary | ICD-10-CM | POA: Diagnosis not present

## 2017-06-05 DIAGNOSIS — R1310 Dysphagia, unspecified: Secondary | ICD-10-CM | POA: Diagnosis not present

## 2017-06-06 DIAGNOSIS — N189 Chronic kidney disease, unspecified: Secondary | ICD-10-CM | POA: Diagnosis not present

## 2017-06-06 DIAGNOSIS — I4891 Unspecified atrial fibrillation: Secondary | ICD-10-CM | POA: Diagnosis not present

## 2017-06-06 DIAGNOSIS — I10 Essential (primary) hypertension: Secondary | ICD-10-CM | POA: Diagnosis not present

## 2017-06-06 DIAGNOSIS — M2450 Contracture, unspecified joint: Secondary | ICD-10-CM | POA: Diagnosis not present

## 2017-06-06 DIAGNOSIS — G20 Parkinson's disease: Secondary | ICD-10-CM | POA: Diagnosis not present

## 2017-06-06 DIAGNOSIS — K219 Gastro-esophageal reflux disease without esophagitis: Secondary | ICD-10-CM | POA: Diagnosis not present

## 2017-06-06 DIAGNOSIS — R1310 Dysphagia, unspecified: Secondary | ICD-10-CM | POA: Diagnosis not present

## 2017-06-07 DIAGNOSIS — G20 Parkinson's disease: Secondary | ICD-10-CM | POA: Diagnosis not present

## 2017-06-07 DIAGNOSIS — I10 Essential (primary) hypertension: Secondary | ICD-10-CM | POA: Diagnosis not present

## 2017-06-07 DIAGNOSIS — N189 Chronic kidney disease, unspecified: Secondary | ICD-10-CM | POA: Diagnosis not present

## 2017-06-07 DIAGNOSIS — I4891 Unspecified atrial fibrillation: Secondary | ICD-10-CM | POA: Diagnosis not present

## 2017-06-07 DIAGNOSIS — R1310 Dysphagia, unspecified: Secondary | ICD-10-CM | POA: Diagnosis not present

## 2017-06-07 DIAGNOSIS — M2450 Contracture, unspecified joint: Secondary | ICD-10-CM | POA: Diagnosis not present

## 2017-06-07 DIAGNOSIS — K219 Gastro-esophageal reflux disease without esophagitis: Secondary | ICD-10-CM | POA: Diagnosis not present

## 2017-06-08 DIAGNOSIS — M2450 Contracture, unspecified joint: Secondary | ICD-10-CM | POA: Diagnosis not present

## 2017-06-08 DIAGNOSIS — I10 Essential (primary) hypertension: Secondary | ICD-10-CM | POA: Diagnosis not present

## 2017-06-08 DIAGNOSIS — I4891 Unspecified atrial fibrillation: Secondary | ICD-10-CM | POA: Diagnosis not present

## 2017-06-08 DIAGNOSIS — K219 Gastro-esophageal reflux disease without esophagitis: Secondary | ICD-10-CM | POA: Diagnosis not present

## 2017-06-08 DIAGNOSIS — N189 Chronic kidney disease, unspecified: Secondary | ICD-10-CM | POA: Diagnosis not present

## 2017-06-08 DIAGNOSIS — G20 Parkinson's disease: Secondary | ICD-10-CM | POA: Diagnosis not present

## 2017-06-08 DIAGNOSIS — R1310 Dysphagia, unspecified: Secondary | ICD-10-CM | POA: Diagnosis not present

## 2017-06-09 DIAGNOSIS — M2450 Contracture, unspecified joint: Secondary | ICD-10-CM | POA: Diagnosis not present

## 2017-06-09 DIAGNOSIS — K219 Gastro-esophageal reflux disease without esophagitis: Secondary | ICD-10-CM | POA: Diagnosis not present

## 2017-06-09 DIAGNOSIS — I4891 Unspecified atrial fibrillation: Secondary | ICD-10-CM | POA: Diagnosis not present

## 2017-06-09 DIAGNOSIS — R1310 Dysphagia, unspecified: Secondary | ICD-10-CM | POA: Diagnosis not present

## 2017-06-09 DIAGNOSIS — I10 Essential (primary) hypertension: Secondary | ICD-10-CM | POA: Diagnosis not present

## 2017-06-09 DIAGNOSIS — G20 Parkinson's disease: Secondary | ICD-10-CM | POA: Diagnosis not present

## 2017-06-09 DIAGNOSIS — N189 Chronic kidney disease, unspecified: Secondary | ICD-10-CM | POA: Diagnosis not present

## 2017-06-10 DIAGNOSIS — G20 Parkinson's disease: Secondary | ICD-10-CM | POA: Diagnosis not present

## 2017-06-10 DIAGNOSIS — I10 Essential (primary) hypertension: Secondary | ICD-10-CM | POA: Diagnosis not present

## 2017-06-10 DIAGNOSIS — R1310 Dysphagia, unspecified: Secondary | ICD-10-CM | POA: Diagnosis not present

## 2017-06-10 DIAGNOSIS — K219 Gastro-esophageal reflux disease without esophagitis: Secondary | ICD-10-CM | POA: Diagnosis not present

## 2017-06-10 DIAGNOSIS — N189 Chronic kidney disease, unspecified: Secondary | ICD-10-CM | POA: Diagnosis not present

## 2017-06-10 DIAGNOSIS — I4891 Unspecified atrial fibrillation: Secondary | ICD-10-CM | POA: Diagnosis not present

## 2017-06-10 DIAGNOSIS — M2450 Contracture, unspecified joint: Secondary | ICD-10-CM | POA: Diagnosis not present

## 2017-06-11 DIAGNOSIS — M2450 Contracture, unspecified joint: Secondary | ICD-10-CM | POA: Diagnosis not present

## 2017-06-11 DIAGNOSIS — K219 Gastro-esophageal reflux disease without esophagitis: Secondary | ICD-10-CM | POA: Diagnosis not present

## 2017-06-11 DIAGNOSIS — N189 Chronic kidney disease, unspecified: Secondary | ICD-10-CM | POA: Diagnosis not present

## 2017-06-11 DIAGNOSIS — I4891 Unspecified atrial fibrillation: Secondary | ICD-10-CM | POA: Diagnosis not present

## 2017-06-11 DIAGNOSIS — G20 Parkinson's disease: Secondary | ICD-10-CM | POA: Diagnosis not present

## 2017-06-11 DIAGNOSIS — I10 Essential (primary) hypertension: Secondary | ICD-10-CM | POA: Diagnosis not present

## 2017-06-11 DIAGNOSIS — R1310 Dysphagia, unspecified: Secondary | ICD-10-CM | POA: Diagnosis not present

## 2017-06-12 DIAGNOSIS — K219 Gastro-esophageal reflux disease without esophagitis: Secondary | ICD-10-CM | POA: Diagnosis not present

## 2017-06-12 DIAGNOSIS — I10 Essential (primary) hypertension: Secondary | ICD-10-CM | POA: Diagnosis not present

## 2017-06-12 DIAGNOSIS — G20 Parkinson's disease: Secondary | ICD-10-CM | POA: Diagnosis not present

## 2017-06-12 DIAGNOSIS — R1310 Dysphagia, unspecified: Secondary | ICD-10-CM | POA: Diagnosis not present

## 2017-06-12 DIAGNOSIS — M2450 Contracture, unspecified joint: Secondary | ICD-10-CM | POA: Diagnosis not present

## 2017-06-12 DIAGNOSIS — N189 Chronic kidney disease, unspecified: Secondary | ICD-10-CM | POA: Diagnosis not present

## 2017-06-12 DIAGNOSIS — I4891 Unspecified atrial fibrillation: Secondary | ICD-10-CM | POA: Diagnosis not present

## 2017-06-13 ENCOUNTER — Non-Acute Institutional Stay (SKILLED_NURSING_FACILITY): Payer: Medicare Other | Admitting: Internal Medicine

## 2017-06-13 ENCOUNTER — Encounter: Payer: Self-pay | Admitting: Internal Medicine

## 2017-06-13 DIAGNOSIS — R627 Adult failure to thrive: Secondary | ICD-10-CM | POA: Diagnosis not present

## 2017-06-13 DIAGNOSIS — M2450 Contracture, unspecified joint: Secondary | ICD-10-CM | POA: Diagnosis not present

## 2017-06-13 DIAGNOSIS — I480 Paroxysmal atrial fibrillation: Secondary | ICD-10-CM

## 2017-06-13 DIAGNOSIS — R1310 Dysphagia, unspecified: Secondary | ICD-10-CM | POA: Diagnosis not present

## 2017-06-13 DIAGNOSIS — N189 Chronic kidney disease, unspecified: Secondary | ICD-10-CM | POA: Diagnosis not present

## 2017-06-13 DIAGNOSIS — F329 Major depressive disorder, single episode, unspecified: Secondary | ICD-10-CM | POA: Diagnosis not present

## 2017-06-13 DIAGNOSIS — I10 Essential (primary) hypertension: Secondary | ICD-10-CM | POA: Diagnosis not present

## 2017-06-13 DIAGNOSIS — F333 Major depressive disorder, recurrent, severe with psychotic symptoms: Secondary | ICD-10-CM

## 2017-06-13 DIAGNOSIS — F0153 Vascular dementia, unspecified severity, with mood disturbance: Secondary | ICD-10-CM

## 2017-06-13 DIAGNOSIS — L299 Pruritus, unspecified: Secondary | ICD-10-CM | POA: Diagnosis not present

## 2017-06-13 DIAGNOSIS — F0150 Vascular dementia without behavioral disturbance: Secondary | ICD-10-CM

## 2017-06-13 DIAGNOSIS — K219 Gastro-esophageal reflux disease without esophagitis: Secondary | ICD-10-CM | POA: Diagnosis not present

## 2017-06-13 DIAGNOSIS — G20 Parkinson's disease: Secondary | ICD-10-CM | POA: Diagnosis not present

## 2017-06-13 DIAGNOSIS — I4891 Unspecified atrial fibrillation: Secondary | ICD-10-CM | POA: Diagnosis not present

## 2017-06-13 DIAGNOSIS — R634 Abnormal weight loss: Secondary | ICD-10-CM

## 2017-06-13 NOTE — Progress Notes (Signed)
DATE:  June 13, 2017  Location:   [facility] Room Number: [place] of Service: SNF (31)   Extended Emergency Contact Information Primary Emergency Contact: Dube,Bobby E Address: [place], [place] [address] [place] Phone: [phone] Relation: Spouse Secondary Emergency Contact: Way,Cheryl Address: [address] [place] [address] [place] Phone: [phone] Relation: Daughter  Advanced Directive information Does Patient Have a Medical Advance Directive?: Yes, Type of Advance Directive: Out of facility DNR (pink MOST or yellow form), Pre-existing out of facility DNR order (yellow form or pink MOST form): Yellow form placed in chart (order not valid for inpatient use);Pink MOST form placed in chart (order not valid for inpatient use), Does patient want to make changes to medical advance directive?: No - Patient declined [facility] PT Chief Complaint  Patient presents with  . Medical Management of Chronic Issues    1 month follow up    HPI:  81 yo female long term resident seen today for f/u. She has no c/o but nursing reports she c/o itching. No rash seen. No f/c. No change in detergents/soaps/lotions. She is a poor historian due to dementia. Hx obtained from chart  Parkinson disease - stable. She has a resting tremor. She does not take any meds.  PAF - rate controlled without meds   Hypertension - diet controlled  Major depression d/o - she is off her medications per her choice. She takes melatonin for sleep .   Left knee osteoarthritis - stable on voltaren gel 4 gm to knees four times daily   Weight loss - weight is 95 pounds (down 19 lbs since Dec 2017). She gets nutritional supplements per facility protocol. Albumin is 3.4  Dementia - end stage. she does not take any meds for cognition. Weight is down 19 lbs since Dec 2017.  Unfortunately weight loss at the end stage can  be expected.     Past Medical History:  Diagnosis Date  . A-fib ([place])   . Anemia   . Anxiety   . Chronic kidney disease   . Chronic pain   . Constipation   . Depression   . Edema   . Gait disturbance   . Hypertension   . Hypokalemia   . Insomnia   . Intertrochanteric fracture ([place])   . Parkinson disease ([place])   . UTI (lower urinary tract infection)   . Vitamin D deficiency     Past Surgical History:  Procedure Laterality Date  . right hip surgery due to fracture      Patient Care Team: Gildardo Cranker, DO as PCP - General (Internal Medicine) Nyoka Cowden Phylis Bougie, NP as Nurse Practitioner (Nurse Practitioner) [facility], [place] ([place])  Social History   Social History  . Marital status: Married    Spouse name: N/A  . Number of children: N/A  . Years of education: N/A   Occupational History  . Not on file.   Social History Main Topics  . Smoking status: Never Smoker  . Smokeless tobacco: Never Used  . Alcohol use No  . Drug use: No  . Sexual activity: Not Currently   Other Topics Concern  . Not on file   Social History Narrative  . No narrative on file     reports that she has never smoked. She has  never used smokeless tobacco. She reports that she does not drink alcohol or use drugs.  Family History  Problem Relation Age of Onset  . Cancer Mother   . Cancer Father    Family Status  Relation Status  . Mother Deceased  . Father Deceased    Immunization History  Administered Date(s) Administered  . Influenza-Unspecified 04/28/2013, 10/09/2014, 02/03/2016  . PPD Test 03/16/2011, 09/10/2016, 09/17/2016  . Pneumococcal-Unspecified 04/28/2013    Allergies  Allergen Reactions  . Penicillins     Rash Has patient had a PCN reaction causing immediate rash, facial/tongue/throat swelling, SOB or lightheadedness with hypotension:YES Has patient had a PCN reaction causing severe rash involving mucus membranes or skin necrosis:  NO Has patient had a PCN reaction that required hospitalization NO Has patient had a PCN reaction occurring within the last 10 years: NO If all of the above answers are "NO", then may proceed with Cephalosporin use.    Medications: Patient's Medications  New Prescriptions   No medications on file  Previous Medications   DICLOFENAC SODIUM (VOLTAREN) 1 % GEL    Apply topically 4 (four) times daily. Apply 4 gram transdermally four times a day to left knee   MELATONIN 3 MG TABS    Take by mouth. Take 2 tablets (6 mg) at bedtime  Modified Medications   No medications on file  Discontinued Medications   No medications on file    Review of Systems  Unable to perform ROS: Dementia    Vitals:   06/13/17 1208  BP: 112/74  Pulse: 66  Resp: 16  Temp: 97.6 F (36.4 C)  SpO2: 96%  Weight: 95 lb 3.2 oz (43.2 kg)  Height: 5\' 2"  (1.575 m)   Body mass index is 17.41 kg/m.  Physical Exam  Constitutional: She appears well-developed.  Frail appearing sitting up in bed in NAD  HENT:  Mouth/Throat: No oropharyngeal exudate.  MM dry  Eyes: Pupils are equal, round, and reactive to light. No scleral icterus.  Neck: Neck supple. Carotid bruit is not present. No tracheal deviation present. No thyromegaly present.  Cardiovascular: Normal rate and intact distal pulses.  An irregularly irregular rhythm present.  No extrasystoles are present. Exam reveals no gallop and no friction rub.   Murmur (1/6 SEM) heard. No LE edema b/l. no calf TTP.   Pulmonary/Chest: Effort normal and breath sounds normal. No stridor. No respiratory distress. She has no wheezes. She has no rales.  Abdominal: Soft. Bowel sounds are normal. She exhibits no distension and no mass. There is no hepatomegaly. There is no tenderness. There is no rebound and no guarding.  Musculoskeletal: She exhibits edema.  Lymphadenopathy:    She has no cervical adenopathy.  Neurological: She is alert. She displays tremor (resting).  Skin:  Skin is warm and dry. No rash noted.  Psychiatric: She has a normal mood and affect. Her behavior is normal. Thought content normal.     Labs reviewed: No visits with results within 3 Month(s) from this visit.  Latest known visit with results is:  Nursing Home on 03/04/2017  Component Date Value Ref Range Status  . Hemoglobin 02/03/2017 9.9* 12.0 - 16.0 g/dL Final  . HCT 02/03/2017 32* 36 - 46 % Final  . Neutrophils Absolute 02/03/2017 5  /L Final  . Platelets 02/03/2017 423* 150 - 399 K/L Final  . WBC 02/03/2017 9.1  10^3/mL Final  . Glucose 02/03/2017 91  mg/dL Final  . BUN 02/03/2017 13  4 - 21  mg/dL Final  . Creatinine 02/03/2017 0.6  0.5 - 1.1 mg/dL Final  . Potassium 02/03/2017 4.2  3.4 - 5.3 mmol/L Final  . Sodium 02/03/2017 138  137 - 147 mmol/L Final  . Alkaline Phosphatase 02/03/2017 135* 25 - 125 U/L Final  . ALT 02/03/2017 5* 7 - 35 U/L Final   <5  . AST 02/03/2017 9* 13 - 35 U/L Final  . Bilirubin, Total 02/03/2017 0.2  mg/dL Final    No results found.   Assessment/Plan   ICD-10-CM   1. Pruritus - NEW; likely due to dry skin +/- #6, 8 L29.9   2. Failure to thrive in adult R62.7   3. Abnormal weight loss R63.4   4. Parkinson disease ([place]) G20   5. Paroxysmal atrial fibrillation (HCC) I48.0   6. Dementia, vascular, with depression F01.50    F32.9   7. Essential hypertension, benign I10   8. Depression, major, recurrent, severe with psychosis ([place]) F33.3      Apply moisturizing lotion to skin BID   Cont current meds as ordered  Nutritional supplements as ordered  Hospice to follow  Will follow  Juma Oxley S. Perlie Gold  [facility] [address] [place], [place] [address] [phone] Cell (Monday-Friday 8 AM - 5 PM) [phone] After 5 PM and follow prompts

## 2017-06-14 DIAGNOSIS — I10 Essential (primary) hypertension: Secondary | ICD-10-CM | POA: Diagnosis not present

## 2017-06-14 DIAGNOSIS — I4891 Unspecified atrial fibrillation: Secondary | ICD-10-CM | POA: Diagnosis not present

## 2017-06-14 DIAGNOSIS — M2450 Contracture, unspecified joint: Secondary | ICD-10-CM | POA: Diagnosis not present

## 2017-06-14 DIAGNOSIS — K219 Gastro-esophageal reflux disease without esophagitis: Secondary | ICD-10-CM | POA: Diagnosis not present

## 2017-06-14 DIAGNOSIS — R1310 Dysphagia, unspecified: Secondary | ICD-10-CM | POA: Diagnosis not present

## 2017-06-14 DIAGNOSIS — G20 Parkinson's disease: Secondary | ICD-10-CM | POA: Diagnosis not present

## 2017-06-14 DIAGNOSIS — N189 Chronic kidney disease, unspecified: Secondary | ICD-10-CM | POA: Diagnosis not present

## 2017-06-15 DIAGNOSIS — N189 Chronic kidney disease, unspecified: Secondary | ICD-10-CM | POA: Diagnosis not present

## 2017-06-15 DIAGNOSIS — I4891 Unspecified atrial fibrillation: Secondary | ICD-10-CM | POA: Diagnosis not present

## 2017-06-15 DIAGNOSIS — I10 Essential (primary) hypertension: Secondary | ICD-10-CM | POA: Diagnosis not present

## 2017-06-15 DIAGNOSIS — R1310 Dysphagia, unspecified: Secondary | ICD-10-CM | POA: Diagnosis not present

## 2017-06-15 DIAGNOSIS — G20 Parkinson's disease: Secondary | ICD-10-CM | POA: Diagnosis not present

## 2017-06-15 DIAGNOSIS — K219 Gastro-esophageal reflux disease without esophagitis: Secondary | ICD-10-CM | POA: Diagnosis not present

## 2017-06-15 DIAGNOSIS — M2450 Contracture, unspecified joint: Secondary | ICD-10-CM | POA: Diagnosis not present

## 2017-06-16 DIAGNOSIS — N189 Chronic kidney disease, unspecified: Secondary | ICD-10-CM | POA: Diagnosis not present

## 2017-06-16 DIAGNOSIS — I4891 Unspecified atrial fibrillation: Secondary | ICD-10-CM | POA: Diagnosis not present

## 2017-06-16 DIAGNOSIS — I10 Essential (primary) hypertension: Secondary | ICD-10-CM | POA: Diagnosis not present

## 2017-06-16 DIAGNOSIS — M2450 Contracture, unspecified joint: Secondary | ICD-10-CM | POA: Diagnosis not present

## 2017-06-16 DIAGNOSIS — K219 Gastro-esophageal reflux disease without esophagitis: Secondary | ICD-10-CM | POA: Diagnosis not present

## 2017-06-16 DIAGNOSIS — G20 Parkinson's disease: Secondary | ICD-10-CM | POA: Diagnosis not present

## 2017-06-16 DIAGNOSIS — R1310 Dysphagia, unspecified: Secondary | ICD-10-CM | POA: Diagnosis not present

## 2017-06-17 DIAGNOSIS — G20 Parkinson's disease: Secondary | ICD-10-CM | POA: Diagnosis not present

## 2017-06-17 DIAGNOSIS — I4891 Unspecified atrial fibrillation: Secondary | ICD-10-CM | POA: Diagnosis not present

## 2017-06-17 DIAGNOSIS — I10 Essential (primary) hypertension: Secondary | ICD-10-CM | POA: Diagnosis not present

## 2017-06-17 DIAGNOSIS — N189 Chronic kidney disease, unspecified: Secondary | ICD-10-CM | POA: Diagnosis not present

## 2017-06-17 DIAGNOSIS — R1310 Dysphagia, unspecified: Secondary | ICD-10-CM | POA: Diagnosis not present

## 2017-06-17 DIAGNOSIS — K219 Gastro-esophageal reflux disease without esophagitis: Secondary | ICD-10-CM | POA: Diagnosis not present

## 2017-06-17 DIAGNOSIS — M2450 Contracture, unspecified joint: Secondary | ICD-10-CM | POA: Diagnosis not present

## 2017-06-18 DIAGNOSIS — G20 Parkinson's disease: Secondary | ICD-10-CM | POA: Diagnosis not present

## 2017-06-18 DIAGNOSIS — M2450 Contracture, unspecified joint: Secondary | ICD-10-CM | POA: Diagnosis not present

## 2017-06-18 DIAGNOSIS — R1310 Dysphagia, unspecified: Secondary | ICD-10-CM | POA: Diagnosis not present

## 2017-06-18 DIAGNOSIS — N189 Chronic kidney disease, unspecified: Secondary | ICD-10-CM | POA: Diagnosis not present

## 2017-06-18 DIAGNOSIS — I10 Essential (primary) hypertension: Secondary | ICD-10-CM | POA: Diagnosis not present

## 2017-06-18 DIAGNOSIS — K219 Gastro-esophageal reflux disease without esophagitis: Secondary | ICD-10-CM | POA: Diagnosis not present

## 2017-06-18 DIAGNOSIS — I4891 Unspecified atrial fibrillation: Secondary | ICD-10-CM | POA: Diagnosis not present

## 2017-06-19 DIAGNOSIS — I10 Essential (primary) hypertension: Secondary | ICD-10-CM | POA: Diagnosis not present

## 2017-06-19 DIAGNOSIS — I4891 Unspecified atrial fibrillation: Secondary | ICD-10-CM | POA: Diagnosis not present

## 2017-06-19 DIAGNOSIS — M2450 Contracture, unspecified joint: Secondary | ICD-10-CM | POA: Diagnosis not present

## 2017-06-19 DIAGNOSIS — R1310 Dysphagia, unspecified: Secondary | ICD-10-CM | POA: Diagnosis not present

## 2017-06-19 DIAGNOSIS — G20 Parkinson's disease: Secondary | ICD-10-CM | POA: Diagnosis not present

## 2017-06-19 DIAGNOSIS — N189 Chronic kidney disease, unspecified: Secondary | ICD-10-CM | POA: Diagnosis not present

## 2017-06-19 DIAGNOSIS — K219 Gastro-esophageal reflux disease without esophagitis: Secondary | ICD-10-CM | POA: Diagnosis not present

## 2017-06-20 DIAGNOSIS — G20 Parkinson's disease: Secondary | ICD-10-CM | POA: Diagnosis not present

## 2017-06-20 DIAGNOSIS — I10 Essential (primary) hypertension: Secondary | ICD-10-CM | POA: Diagnosis not present

## 2017-06-20 DIAGNOSIS — K219 Gastro-esophageal reflux disease without esophagitis: Secondary | ICD-10-CM | POA: Diagnosis not present

## 2017-06-20 DIAGNOSIS — M2450 Contracture, unspecified joint: Secondary | ICD-10-CM | POA: Diagnosis not present

## 2017-06-20 DIAGNOSIS — N189 Chronic kidney disease, unspecified: Secondary | ICD-10-CM | POA: Diagnosis not present

## 2017-06-20 DIAGNOSIS — I4891 Unspecified atrial fibrillation: Secondary | ICD-10-CM | POA: Diagnosis not present

## 2017-06-20 DIAGNOSIS — R1310 Dysphagia, unspecified: Secondary | ICD-10-CM | POA: Diagnosis not present

## 2017-06-21 DIAGNOSIS — I4891 Unspecified atrial fibrillation: Secondary | ICD-10-CM | POA: Diagnosis not present

## 2017-06-21 DIAGNOSIS — R1310 Dysphagia, unspecified: Secondary | ICD-10-CM | POA: Diagnosis not present

## 2017-06-21 DIAGNOSIS — K219 Gastro-esophageal reflux disease without esophagitis: Secondary | ICD-10-CM | POA: Diagnosis not present

## 2017-06-21 DIAGNOSIS — M2450 Contracture, unspecified joint: Secondary | ICD-10-CM | POA: Diagnosis not present

## 2017-06-21 DIAGNOSIS — I10 Essential (primary) hypertension: Secondary | ICD-10-CM | POA: Diagnosis not present

## 2017-06-21 DIAGNOSIS — N189 Chronic kidney disease, unspecified: Secondary | ICD-10-CM | POA: Diagnosis not present

## 2017-06-21 DIAGNOSIS — G20 Parkinson's disease: Secondary | ICD-10-CM | POA: Diagnosis not present

## 2017-06-22 DIAGNOSIS — R1310 Dysphagia, unspecified: Secondary | ICD-10-CM | POA: Diagnosis not present

## 2017-06-22 DIAGNOSIS — K219 Gastro-esophageal reflux disease without esophagitis: Secondary | ICD-10-CM | POA: Diagnosis not present

## 2017-06-22 DIAGNOSIS — G20 Parkinson's disease: Secondary | ICD-10-CM | POA: Diagnosis not present

## 2017-06-22 DIAGNOSIS — I4891 Unspecified atrial fibrillation: Secondary | ICD-10-CM | POA: Diagnosis not present

## 2017-06-22 DIAGNOSIS — N189 Chronic kidney disease, unspecified: Secondary | ICD-10-CM | POA: Diagnosis not present

## 2017-06-22 DIAGNOSIS — M2450 Contracture, unspecified joint: Secondary | ICD-10-CM | POA: Diagnosis not present

## 2017-06-22 DIAGNOSIS — I10 Essential (primary) hypertension: Secondary | ICD-10-CM | POA: Diagnosis not present

## 2017-06-23 DIAGNOSIS — M2450 Contracture, unspecified joint: Secondary | ICD-10-CM | POA: Diagnosis not present

## 2017-06-23 DIAGNOSIS — R1310 Dysphagia, unspecified: Secondary | ICD-10-CM | POA: Diagnosis not present

## 2017-06-23 DIAGNOSIS — G20 Parkinson's disease: Secondary | ICD-10-CM | POA: Diagnosis not present

## 2017-06-23 DIAGNOSIS — I4891 Unspecified atrial fibrillation: Secondary | ICD-10-CM | POA: Diagnosis not present

## 2017-06-23 DIAGNOSIS — I10 Essential (primary) hypertension: Secondary | ICD-10-CM | POA: Diagnosis not present

## 2017-06-23 DIAGNOSIS — N189 Chronic kidney disease, unspecified: Secondary | ICD-10-CM | POA: Diagnosis not present

## 2017-06-23 DIAGNOSIS — K219 Gastro-esophageal reflux disease without esophagitis: Secondary | ICD-10-CM | POA: Diagnosis not present

## 2017-06-24 DIAGNOSIS — R1310 Dysphagia, unspecified: Secondary | ICD-10-CM | POA: Diagnosis not present

## 2017-06-24 DIAGNOSIS — N189 Chronic kidney disease, unspecified: Secondary | ICD-10-CM | POA: Diagnosis not present

## 2017-06-24 DIAGNOSIS — K219 Gastro-esophageal reflux disease without esophagitis: Secondary | ICD-10-CM | POA: Diagnosis not present

## 2017-06-24 DIAGNOSIS — I4891 Unspecified atrial fibrillation: Secondary | ICD-10-CM | POA: Diagnosis not present

## 2017-06-24 DIAGNOSIS — I10 Essential (primary) hypertension: Secondary | ICD-10-CM | POA: Diagnosis not present

## 2017-06-24 DIAGNOSIS — G20 Parkinson's disease: Secondary | ICD-10-CM | POA: Diagnosis not present

## 2017-06-24 DIAGNOSIS — M2450 Contracture, unspecified joint: Secondary | ICD-10-CM | POA: Diagnosis not present

## 2017-06-25 DIAGNOSIS — I4891 Unspecified atrial fibrillation: Secondary | ICD-10-CM | POA: Diagnosis not present

## 2017-06-25 DIAGNOSIS — R1310 Dysphagia, unspecified: Secondary | ICD-10-CM | POA: Diagnosis not present

## 2017-06-25 DIAGNOSIS — K219 Gastro-esophageal reflux disease without esophagitis: Secondary | ICD-10-CM | POA: Diagnosis not present

## 2017-06-25 DIAGNOSIS — N189 Chronic kidney disease, unspecified: Secondary | ICD-10-CM | POA: Diagnosis not present

## 2017-06-25 DIAGNOSIS — G20 Parkinson's disease: Secondary | ICD-10-CM | POA: Diagnosis not present

## 2017-06-25 DIAGNOSIS — I10 Essential (primary) hypertension: Secondary | ICD-10-CM | POA: Diagnosis not present

## 2017-06-25 DIAGNOSIS — M2450 Contracture, unspecified joint: Secondary | ICD-10-CM | POA: Diagnosis not present

## 2017-06-26 DIAGNOSIS — K219 Gastro-esophageal reflux disease without esophagitis: Secondary | ICD-10-CM | POA: Diagnosis not present

## 2017-06-26 DIAGNOSIS — I4891 Unspecified atrial fibrillation: Secondary | ICD-10-CM | POA: Diagnosis not present

## 2017-06-26 DIAGNOSIS — M2450 Contracture, unspecified joint: Secondary | ICD-10-CM | POA: Diagnosis not present

## 2017-06-26 DIAGNOSIS — R1310 Dysphagia, unspecified: Secondary | ICD-10-CM | POA: Diagnosis not present

## 2017-06-26 DIAGNOSIS — I10 Essential (primary) hypertension: Secondary | ICD-10-CM | POA: Diagnosis not present

## 2017-06-26 DIAGNOSIS — G20 Parkinson's disease: Secondary | ICD-10-CM | POA: Diagnosis not present

## 2017-06-26 DIAGNOSIS — N189 Chronic kidney disease, unspecified: Secondary | ICD-10-CM | POA: Diagnosis not present

## 2017-06-27 DIAGNOSIS — G20 Parkinson's disease: Secondary | ICD-10-CM | POA: Diagnosis not present

## 2017-06-27 DIAGNOSIS — N189 Chronic kidney disease, unspecified: Secondary | ICD-10-CM | POA: Diagnosis not present

## 2017-06-27 DIAGNOSIS — I4891 Unspecified atrial fibrillation: Secondary | ICD-10-CM | POA: Diagnosis not present

## 2017-06-27 DIAGNOSIS — M2450 Contracture, unspecified joint: Secondary | ICD-10-CM | POA: Diagnosis not present

## 2017-06-27 DIAGNOSIS — I10 Essential (primary) hypertension: Secondary | ICD-10-CM | POA: Diagnosis not present

## 2017-06-27 DIAGNOSIS — R1310 Dysphagia, unspecified: Secondary | ICD-10-CM | POA: Diagnosis not present

## 2017-06-27 DIAGNOSIS — K219 Gastro-esophageal reflux disease without esophagitis: Secondary | ICD-10-CM | POA: Diagnosis not present

## 2017-06-28 DIAGNOSIS — R1310 Dysphagia, unspecified: Secondary | ICD-10-CM | POA: Diagnosis not present

## 2017-06-28 DIAGNOSIS — G20 Parkinson's disease: Secondary | ICD-10-CM | POA: Diagnosis not present

## 2017-06-28 DIAGNOSIS — N189 Chronic kidney disease, unspecified: Secondary | ICD-10-CM | POA: Diagnosis not present

## 2017-06-28 DIAGNOSIS — I4891 Unspecified atrial fibrillation: Secondary | ICD-10-CM | POA: Diagnosis not present

## 2017-06-28 DIAGNOSIS — M2450 Contracture, unspecified joint: Secondary | ICD-10-CM | POA: Diagnosis not present

## 2017-06-28 DIAGNOSIS — I10 Essential (primary) hypertension: Secondary | ICD-10-CM | POA: Diagnosis not present

## 2017-06-28 DIAGNOSIS — K219 Gastro-esophageal reflux disease without esophagitis: Secondary | ICD-10-CM | POA: Diagnosis not present

## 2017-06-29 DIAGNOSIS — R1310 Dysphagia, unspecified: Secondary | ICD-10-CM | POA: Diagnosis not present

## 2017-06-29 DIAGNOSIS — M2450 Contracture, unspecified joint: Secondary | ICD-10-CM | POA: Diagnosis not present

## 2017-06-29 DIAGNOSIS — N189 Chronic kidney disease, unspecified: Secondary | ICD-10-CM | POA: Diagnosis not present

## 2017-06-29 DIAGNOSIS — K219 Gastro-esophageal reflux disease without esophagitis: Secondary | ICD-10-CM | POA: Diagnosis not present

## 2017-06-29 DIAGNOSIS — I4891 Unspecified atrial fibrillation: Secondary | ICD-10-CM | POA: Diagnosis not present

## 2017-06-29 DIAGNOSIS — G20 Parkinson's disease: Secondary | ICD-10-CM | POA: Diagnosis not present

## 2017-06-29 DIAGNOSIS — I10 Essential (primary) hypertension: Secondary | ICD-10-CM | POA: Diagnosis not present

## 2017-06-30 DIAGNOSIS — I4891 Unspecified atrial fibrillation: Secondary | ICD-10-CM | POA: Diagnosis not present

## 2017-06-30 DIAGNOSIS — G20 Parkinson's disease: Secondary | ICD-10-CM | POA: Diagnosis not present

## 2017-06-30 DIAGNOSIS — K219 Gastro-esophageal reflux disease without esophagitis: Secondary | ICD-10-CM | POA: Diagnosis not present

## 2017-06-30 DIAGNOSIS — R1310 Dysphagia, unspecified: Secondary | ICD-10-CM | POA: Diagnosis not present

## 2017-06-30 DIAGNOSIS — N189 Chronic kidney disease, unspecified: Secondary | ICD-10-CM | POA: Diagnosis not present

## 2017-06-30 DIAGNOSIS — I10 Essential (primary) hypertension: Secondary | ICD-10-CM | POA: Diagnosis not present

## 2017-06-30 DIAGNOSIS — M2450 Contracture, unspecified joint: Secondary | ICD-10-CM | POA: Diagnosis not present

## 2017-07-01 DIAGNOSIS — K219 Gastro-esophageal reflux disease without esophagitis: Secondary | ICD-10-CM | POA: Diagnosis not present

## 2017-07-01 DIAGNOSIS — I4891 Unspecified atrial fibrillation: Secondary | ICD-10-CM | POA: Diagnosis not present

## 2017-07-01 DIAGNOSIS — G20 Parkinson's disease: Secondary | ICD-10-CM | POA: Diagnosis not present

## 2017-07-01 DIAGNOSIS — N189 Chronic kidney disease, unspecified: Secondary | ICD-10-CM | POA: Diagnosis not present

## 2017-07-01 DIAGNOSIS — I10 Essential (primary) hypertension: Secondary | ICD-10-CM | POA: Diagnosis not present

## 2017-07-01 DIAGNOSIS — M2450 Contracture, unspecified joint: Secondary | ICD-10-CM | POA: Diagnosis not present

## 2017-07-01 DIAGNOSIS — R1310 Dysphagia, unspecified: Secondary | ICD-10-CM | POA: Diagnosis not present

## 2017-07-02 DIAGNOSIS — I4891 Unspecified atrial fibrillation: Secondary | ICD-10-CM | POA: Diagnosis not present

## 2017-07-02 DIAGNOSIS — G20 Parkinson's disease: Secondary | ICD-10-CM | POA: Diagnosis not present

## 2017-07-02 DIAGNOSIS — K219 Gastro-esophageal reflux disease without esophagitis: Secondary | ICD-10-CM | POA: Diagnosis not present

## 2017-07-02 DIAGNOSIS — M2450 Contracture, unspecified joint: Secondary | ICD-10-CM | POA: Diagnosis not present

## 2017-07-02 DIAGNOSIS — R1310 Dysphagia, unspecified: Secondary | ICD-10-CM | POA: Diagnosis not present

## 2017-07-02 DIAGNOSIS — I10 Essential (primary) hypertension: Secondary | ICD-10-CM | POA: Diagnosis not present

## 2017-07-02 DIAGNOSIS — N189 Chronic kidney disease, unspecified: Secondary | ICD-10-CM | POA: Diagnosis not present

## 2017-07-03 DIAGNOSIS — I4891 Unspecified atrial fibrillation: Secondary | ICD-10-CM | POA: Diagnosis not present

## 2017-07-03 DIAGNOSIS — M2450 Contracture, unspecified joint: Secondary | ICD-10-CM | POA: Diagnosis not present

## 2017-07-03 DIAGNOSIS — R1310 Dysphagia, unspecified: Secondary | ICD-10-CM | POA: Diagnosis not present

## 2017-07-03 DIAGNOSIS — K219 Gastro-esophageal reflux disease without esophagitis: Secondary | ICD-10-CM | POA: Diagnosis not present

## 2017-07-03 DIAGNOSIS — I10 Essential (primary) hypertension: Secondary | ICD-10-CM | POA: Diagnosis not present

## 2017-07-03 DIAGNOSIS — N189 Chronic kidney disease, unspecified: Secondary | ICD-10-CM | POA: Diagnosis not present

## 2017-07-03 DIAGNOSIS — G20 Parkinson's disease: Secondary | ICD-10-CM | POA: Diagnosis not present

## 2017-07-04 DIAGNOSIS — N189 Chronic kidney disease, unspecified: Secondary | ICD-10-CM | POA: Diagnosis not present

## 2017-07-04 DIAGNOSIS — I10 Essential (primary) hypertension: Secondary | ICD-10-CM | POA: Diagnosis not present

## 2017-07-04 DIAGNOSIS — K219 Gastro-esophageal reflux disease without esophagitis: Secondary | ICD-10-CM | POA: Diagnosis not present

## 2017-07-04 DIAGNOSIS — M2450 Contracture, unspecified joint: Secondary | ICD-10-CM | POA: Diagnosis not present

## 2017-07-04 DIAGNOSIS — I4891 Unspecified atrial fibrillation: Secondary | ICD-10-CM | POA: Diagnosis not present

## 2017-07-04 DIAGNOSIS — R1310 Dysphagia, unspecified: Secondary | ICD-10-CM | POA: Diagnosis not present

## 2017-07-04 DIAGNOSIS — G20 Parkinson's disease: Secondary | ICD-10-CM | POA: Diagnosis not present

## 2017-07-05 DIAGNOSIS — N189 Chronic kidney disease, unspecified: Secondary | ICD-10-CM | POA: Diagnosis not present

## 2017-07-05 DIAGNOSIS — R1310 Dysphagia, unspecified: Secondary | ICD-10-CM | POA: Diagnosis not present

## 2017-07-05 DIAGNOSIS — I10 Essential (primary) hypertension: Secondary | ICD-10-CM | POA: Diagnosis not present

## 2017-07-05 DIAGNOSIS — I4891 Unspecified atrial fibrillation: Secondary | ICD-10-CM | POA: Diagnosis not present

## 2017-07-05 DIAGNOSIS — K219 Gastro-esophageal reflux disease without esophagitis: Secondary | ICD-10-CM | POA: Diagnosis not present

## 2017-07-05 DIAGNOSIS — M2450 Contracture, unspecified joint: Secondary | ICD-10-CM | POA: Diagnosis not present

## 2017-07-05 DIAGNOSIS — G20 Parkinson's disease: Secondary | ICD-10-CM | POA: Diagnosis not present

## 2017-07-06 ENCOUNTER — Non-Acute Institutional Stay (SKILLED_NURSING_FACILITY): Payer: Medicare Other | Admitting: Adult Health

## 2017-07-06 DIAGNOSIS — J690 Pneumonitis due to inhalation of food and vomit: Secondary | ICD-10-CM | POA: Diagnosis not present

## 2017-07-06 DIAGNOSIS — R0989 Other specified symptoms and signs involving the circulatory and respiratory systems: Secondary | ICD-10-CM | POA: Diagnosis not present

## 2017-07-06 DIAGNOSIS — M2450 Contracture, unspecified joint: Secondary | ICD-10-CM | POA: Diagnosis not present

## 2017-07-06 DIAGNOSIS — F0150 Vascular dementia without behavioral disturbance: Secondary | ICD-10-CM

## 2017-07-06 DIAGNOSIS — R627 Adult failure to thrive: Secondary | ICD-10-CM | POA: Diagnosis not present

## 2017-07-06 DIAGNOSIS — N189 Chronic kidney disease, unspecified: Secondary | ICD-10-CM | POA: Diagnosis not present

## 2017-07-06 DIAGNOSIS — I4891 Unspecified atrial fibrillation: Secondary | ICD-10-CM | POA: Diagnosis not present

## 2017-07-06 DIAGNOSIS — F0153 Vascular dementia, unspecified severity, with mood disturbance: Secondary | ICD-10-CM

## 2017-07-06 DIAGNOSIS — G20 Parkinson's disease: Secondary | ICD-10-CM | POA: Diagnosis not present

## 2017-07-06 DIAGNOSIS — K219 Gastro-esophageal reflux disease without esophagitis: Secondary | ICD-10-CM | POA: Diagnosis not present

## 2017-07-06 DIAGNOSIS — F329 Major depressive disorder, single episode, unspecified: Secondary | ICD-10-CM | POA: Diagnosis not present

## 2017-07-06 DIAGNOSIS — R1310 Dysphagia, unspecified: Secondary | ICD-10-CM | POA: Diagnosis not present

## 2017-07-06 DIAGNOSIS — I639 Cerebral infarction, unspecified: Secondary | ICD-10-CM

## 2017-07-06 DIAGNOSIS — I10 Essential (primary) hypertension: Secondary | ICD-10-CM | POA: Diagnosis not present

## 2017-07-07 ENCOUNTER — Other Ambulatory Visit: Payer: Self-pay

## 2017-07-07 MED ORDER — LORAZEPAM 2 MG/ML PO CONC: ORAL | 0 refills | Status: AC

## 2017-07-07 NOTE — Telephone Encounter (Signed)
RX faxed to [facility] @ [phone], phone number [phone] 

## 2017-07-21 DIAGNOSIS — I639 Cerebral infarction, unspecified: Secondary | ICD-10-CM | POA: Insufficient documentation

## 2017-07-21 DIAGNOSIS — J690 Pneumonitis due to inhalation of food and vomit: Secondary | ICD-10-CM | POA: Insufficient documentation

## 2017-07-27 NOTE — Progress Notes (Signed)
Location:   [facility] Room Number: 865 Place of Service:  SNF (31)   CODE STATUS: dnr  Allergies  Allergen Reactions  . Penicillins     Rash Has patient had a PCN reaction causing immediate rash, facial/tongue/throat swelling, SOB or lightheadedness with hypotension:YES Has patient had a PCN reaction causing severe rash involving mucus membranes or skin necrosis: NO Has patient had a PCN reaction that required hospitalization NO Has patient had a PCN reaction occurring within the last 10 years: NO If all of the above answers are "NO", then may proceed with Cephalosporin use.    Chief Complaint  Patient presents with  . Acute Visit    change in status     HPI:  Staff reports today that she has had a change in her status. She has left facial droop is not verbally responsive. She does appear anxious. She was verbally responsive when last seen by the nursing staff. She is unable to participate in the hpi or ros. More than likely she has suffered a cva.  She is having difficulty breathing. She has crackles and rhonchi throughout  her lungs; more than likely she has aspirated.  We have spoken with her spouse he does desire for an x-ray and possible treatment for her pneumonia. We did talk about her life expectancy; which is terminal at this time.     Past Medical History:  Diagnosis Date  . A-fib ([place])   . Anemia   . Anxiety   . Chronic kidney disease   . Chronic pain   . Constipation   . Depression   . Edema   . Gait disturbance   . Hypertension   . Hypokalemia   . Insomnia   . Intertrochanteric fracture ([place])   . Parkinson disease ([place])   . UTI (lower urinary tract infection)   . Vitamin D deficiency     Past Surgical History:  Procedure Laterality Date  . right hip surgery due to fracture      Social History   Social History  . Marital status: Married    Spouse name: N/A  . Number of children: N/A  . Years of education: N/A   Occupational  History  . Not on file.   Social History Main Topics  . Smoking status: Never Smoker  . Smokeless tobacco: Never Used  . Alcohol use No  . Drug use: No  . Sexual activity: Not Currently   Other Topics Concern  . Not on file   Social History Narrative  . No narrative on file   Family History  Problem Relation Age of Onset  . Cancer Mother   . Cancer Father       VITAL SIGNS BP 112/74   Pulse 73   Temp 98 F (36.7 C)   Resp 20   Ht 5' 2"  (1.575 m)   Wt 95 lb 12.8 oz (43.5 kg)   SpO2 97%   BMI 17.52 kg/m   Patient's Medications  New Prescriptions   No medications on file  Previous Medications   DICLOFENAC SODIUM (VOLTAREN) 1 % GEL    Apply topically 4 (four) times daily. Apply 4 gram transdermally four times a day to left knee   MELATONIN 3 MG TABS    Take by mouth. Take 2 tablets (6 mg) at bedtime  Modified Medications   No medications on file  Discontinued Medications   No medications on file     SIGNIFICANT DIAGNOSTIC EXAMS  03-26-15:  left knee x-ray: modest osteoarthritis  03-26-15: left hip with pelvis x-ray; modest osteoarthritis left hip   NO NEW EXAMS   LABS REVIEWED:   06-03-16: wbc 9.4; hgb 10.3; hct 34.3; mcv 87.3; plt 425; glucose 94; bun 16; creat 0.77; k+ 4.2; na++ 137 07-15-16: wbc 8.8 ;hgb 10.4; hct 32; plt 470; glucose 92; bun 12; crea t0.7; k+ 4.6; na++ 137  08-31-16: wbc 8.8; hgb 10.7; hct 36.7; mcv 83.6; plt 466; glucose 130; bun 12.1; creat 0.70; k+ 4.5; na++ 139  02-03-17: wbc 9.1; hgb 9.9; hct 31.9; mcv 83.7; plt 423; glucose 91; bun 13.4; creat 0.58; k+ 4.2; na++ 138 alk phos 135; albumin 3.4   NO NEW LABS   Review of Systems  Unable to perform ROS: Medical condition (is nonverbal )    Physical Exam  Constitutional: No distress.  Frail   HENT:  Has left facial droop  Eyes: Conjunctivae are normal.  Neck: Neck supple. No JVD present. No thyromegaly present.  Cardiovascular: Normal rate and intact distal pulses.   Heart rate  irregular   Respiratory: Effort normal. No respiratory distress. She has no wheezes.  Rhonchi and crackles throughout   GI: Soft. Bowel sounds are normal. She exhibits no distension. There is no tenderness.  Musculoskeletal: She exhibits no edema.     Lymphadenopathy:    She has no cervical adenopathy.  Neurological:  Is aware   Skin: Skin is warm and dry. She is not diaphoretic.  Psychiatric:  Appears anxious     ASSESSMENT/ PLAN:  1. Acute cva 2. Vascular dementia   3. Failure to thrive 4. Aspiration pneumonia   After speaking with her spouse will get a chest x-ray and will begin rocephin 500 mg daily for 10 days Will begin ativan solution 0.5 mg every 6 hours as needed Will begin atropine drops give 2 drops every 6 hours as needed for excessive oral secretions Will use nectar thick liquids as able.   Time spent with patient  45  minutes >50% time spent counseling( her spouse about cva; and probable asirpation pneumonia he does want an x-ray and abt ); reviewing medical record; tests; labs; and developing future plan of care   MD is aware of resident's narcotic use and is in agreement with current plan of care. We will attempt to wean resident as apropriate   Ok Edwards NP [facility]  Contact [phone] Monday through Friday 8am- 5pm  After hours call [phone]

## 2017-07-27 DEATH — deceased
